# Patient Record
Sex: Female | Born: 1965
Health system: Southern US, Community
[De-identification: ages and names within clinical notes are randomized; demographics above are authoritative.]

## PROBLEM LIST (undated history)

## (undated) DIAGNOSIS — I422 Other hypertrophic cardiomyopathy: Secondary | ICD-10-CM

## (undated) DIAGNOSIS — F319 Bipolar disorder, unspecified: Secondary | ICD-10-CM

## (undated) DIAGNOSIS — F1011 Alcohol abuse, in remission: Secondary | ICD-10-CM

## (undated) DIAGNOSIS — F1911 Other psychoactive substance abuse, in remission: Secondary | ICD-10-CM

## (undated) DIAGNOSIS — N3010 Interstitial cystitis (chronic) without hematuria: Secondary | ICD-10-CM

## (undated) DIAGNOSIS — F909 Attention-deficit hyperactivity disorder, unspecified type: Secondary | ICD-10-CM

## (undated) HISTORY — PX: OTHER SURGICAL HISTORY: SHX169

## (undated) HISTORY — PX: APPENDECTOMY: SHX54

## (undated) HISTORY — DX: Other psychoactive substance abuse, in remission: F19.11

## (undated) HISTORY — DX: Other hypertrophic cardiomyopathy: I42.2

## (undated) HISTORY — DX: Alcohol abuse, in remission: F10.11

---
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
## 1991-03-22 HISTORY — PX: SALPINGECTOMY: SHX328

## 2000-10-02 ENCOUNTER — Emergency Department (HOSPITAL_COMMUNITY): Admission: EM | Admit: 2000-10-02 | Discharge: 2000-10-02 | Payer: Self-pay | Admitting: Emergency Medicine

## 2000-10-12 ENCOUNTER — Inpatient Hospital Stay (HOSPITAL_COMMUNITY): Admission: AD | Admit: 2000-10-12 | Discharge: 2000-10-12 | Payer: Self-pay | Admitting: Obstetrics & Gynecology

## 2000-10-12 ENCOUNTER — Encounter: Payer: Self-pay | Admitting: Obstetrics & Gynecology

## 2000-10-17 ENCOUNTER — Other Ambulatory Visit: Admission: RE | Admit: 2000-10-17 | Discharge: 2000-10-17 | Payer: Self-pay | Admitting: *Deleted

## 2000-11-26 ENCOUNTER — Emergency Department (HOSPITAL_COMMUNITY): Admission: EM | Admit: 2000-11-26 | Discharge: 2000-11-26 | Payer: Self-pay | Admitting: *Deleted

## 2001-03-16 ENCOUNTER — Inpatient Hospital Stay (HOSPITAL_COMMUNITY): Admission: AD | Admit: 2001-03-16 | Discharge: 2001-03-16 | Payer: Self-pay | Admitting: *Deleted

## 2001-03-20 ENCOUNTER — Inpatient Hospital Stay (HOSPITAL_COMMUNITY): Admission: AD | Admit: 2001-03-20 | Discharge: 2001-03-20 | Payer: Self-pay | Admitting: *Deleted

## 2001-03-21 ENCOUNTER — Inpatient Hospital Stay (HOSPITAL_COMMUNITY): Admission: AD | Admit: 2001-03-21 | Discharge: 2001-03-23 | Payer: Self-pay | Admitting: *Deleted

## 2001-10-26 ENCOUNTER — Other Ambulatory Visit: Admission: RE | Admit: 2001-10-26 | Discharge: 2001-10-26 | Payer: Self-pay | Admitting: *Deleted

## 2002-10-02 ENCOUNTER — Other Ambulatory Visit: Admission: RE | Admit: 2002-10-02 | Discharge: 2002-10-02 | Payer: Self-pay | Admitting: *Deleted

## 2004-11-11 ENCOUNTER — Ambulatory Visit (HOSPITAL_COMMUNITY): Admission: RE | Admit: 2004-11-11 | Discharge: 2004-11-11 | Payer: Self-pay | Admitting: Obstetrics

## 2005-10-11 ENCOUNTER — Ambulatory Visit: Payer: Self-pay | Admitting: Family Medicine

## 2005-10-11 ENCOUNTER — Ambulatory Visit: Payer: Self-pay | Admitting: *Deleted

## 2006-01-19 ENCOUNTER — Emergency Department (HOSPITAL_COMMUNITY): Admission: EM | Admit: 2006-01-19 | Discharge: 2006-01-19 | Payer: Self-pay | Admitting: Emergency Medicine

## 2008-03-19 ENCOUNTER — Ambulatory Visit (HOSPITAL_COMMUNITY): Admission: RE | Admit: 2008-03-19 | Discharge: 2008-03-19 | Payer: Self-pay | Admitting: Obstetrics

## 2008-05-22 ENCOUNTER — Ambulatory Visit (HOSPITAL_BASED_OUTPATIENT_CLINIC_OR_DEPARTMENT_OTHER): Admission: RE | Admit: 2008-05-22 | Discharge: 2008-05-22 | Payer: Self-pay | Admitting: Urology

## 2009-03-16 ENCOUNTER — Emergency Department (HOSPITAL_COMMUNITY): Admission: EM | Admit: 2009-03-16 | Discharge: 2009-03-17 | Payer: Self-pay | Admitting: Emergency Medicine

## 2010-04-15 ENCOUNTER — Ambulatory Visit (HOSPITAL_COMMUNITY)
Admission: RE | Admit: 2010-04-15 | Discharge: 2010-04-15 | Payer: Self-pay | Source: Home / Self Care | Attending: Obstetrics | Admitting: Obstetrics

## 2010-06-21 LAB — CBC
HCT: 44.3 % (ref 36.0–46.0)
Hemoglobin: 15.3 g/dL — ABNORMAL HIGH (ref 12.0–15.0)
MCHC: 34.6 g/dL (ref 30.0–36.0)
MCV: 91.5 fL (ref 78.0–100.0)
Platelets: 203 10*3/uL (ref 150–400)
RBC: 4.84 MIL/uL (ref 3.87–5.11)
RDW: 13 % (ref 11.5–15.5)
WBC: 18.3 10*3/uL — ABNORMAL HIGH (ref 4.0–10.5)

## 2010-06-21 LAB — DIFFERENTIAL
Basophils Absolute: 0 10*3/uL (ref 0.0–0.1)
Basophils Relative: 0 % (ref 0–1)
Eosinophils Absolute: 0.2 10*3/uL (ref 0.0–0.7)
Eosinophils Relative: 1 % (ref 0–5)
Lymphocytes Relative: 3 % — ABNORMAL LOW (ref 12–46)
Lymphs Abs: 0.5 10*3/uL — ABNORMAL LOW (ref 0.7–4.0)
Monocytes Absolute: 0.4 10*3/uL (ref 0.1–1.0)
Monocytes Relative: 2 % — ABNORMAL LOW (ref 3–12)
Neutro Abs: 17.2 10*3/uL — ABNORMAL HIGH (ref 1.7–7.7)
Neutrophils Relative %: 94 % — ABNORMAL HIGH (ref 43–77)

## 2010-06-21 LAB — COMPREHENSIVE METABOLIC PANEL
ALT: 37 U/L — ABNORMAL HIGH (ref 0–35)
AST: 30 U/L (ref 0–37)
Albumin: 4.3 g/dL (ref 3.5–5.2)
Alkaline Phosphatase: 99 U/L (ref 39–117)
BUN: 11 mg/dL (ref 6–23)
CO2: 26 mEq/L (ref 19–32)
Calcium: 8.9 mg/dL (ref 8.4–10.5)
Chloride: 101 mEq/L (ref 96–112)
Creatinine, Ser: 0.73 mg/dL (ref 0.4–1.2)
GFR calc non Af Amer: >60 mL/min (ref >60)
Glucose, Bld: 81 mg/dL (ref 70–99)
Potassium: 3.5 mEq/L (ref 3.5–5.1)
Sodium: 138 mEq/L (ref 135–145)
Total Bilirubin: 0.6 mg/dL (ref 0.3–1.2)
Total Protein: 7.6 g/dL (ref 6.0–8.3)

## 2010-06-21 LAB — POCT I-STAT, CHEM 8
BUN: 13 mg/dL (ref 6–23)
Calcium, Ion: 1.06 mmol/L — ABNORMAL LOW (ref 1.12–1.32)
Chloride: 105 mEq/L (ref 96–112)
Creatinine, Ser: 0.7 mg/dL (ref 0.4–1.2)
Glucose, Bld: 101 mg/dL — ABNORMAL HIGH (ref 70–99)
HCT: 47 % — ABNORMAL HIGH (ref 36.0–46.0)
Hemoglobin: 16 g/dL — ABNORMAL HIGH (ref 12.0–15.0)
Potassium: 3.7 mEq/L (ref 3.5–5.1)
Sodium: 138 mEq/L (ref 135–145)
TCO2: 27 mmol/L (ref 0–100)

## 2010-06-21 LAB — URINALYSIS, ROUTINE W REFLEX MICROSCOPIC
Glucose, UA: NEGATIVE mg/dL
Hgb urine dipstick: NEGATIVE
Ketones, ur: 15 mg/dL — AB
Nitrite: NEGATIVE
Protein, ur: NEGATIVE mg/dL
Specific Gravity, Urine: 1.027 (ref 1.005–1.030)
Urobilinogen, UA: 0.2 mg/dL (ref 0.0–1.0)
pH: 5 (ref 5.0–8.0)

## 2010-06-21 LAB — POCT PREGNANCY, URINE

## 2010-06-21 LAB — LIPASE, BLOOD: Lipase: 17 U/L (ref 11–59)

## 2010-07-01 LAB — POCT HEMOGLOBIN-HEMACUE: Hemoglobin: 16 g/dL — ABNORMAL HIGH (ref 12.0–15.0)

## 2010-07-01 LAB — POCT PREGNANCY, URINE

## 2010-08-03 NOTE — Op Note (Signed)
[name], MANNER                ACCOUNT NO.:  [id number]   MEDICAL RECORD NO.:  [id number]          PATIENT TYPE:  AMB   LOCATION:  [facility]                         FACILITY:  [facility]   PHYSICIAN:  Martina Sinner, MD DATE OF BIRTH:  1965/09/07   DATE OF PROCEDURE:  05/22/2008  DATE OF DISCHARGE:                               OPERATIVE REPORT   PREOPERATIVE DIAGNOSIS:  Pelvic pain.   POSTOPERATIVE DIAGNOSIS:  Interstitial cystitis.   SURGERY:  Cystoscopy, bladder hydrodistention, installation of bladder  rescue treatment.   INDICATION:  Kylie Wright has pelvic pain and frequency.  She had  negative cultures.   DESCRIPTION OF PROCEDURE:  The patient prepped and draped in usual  fashion.  A 21 scope was utilized.  Bladder mucosa and trigone were  normal.  There was no  foreign body or carcinoma.  She only could be  hydrodistended to 500 or 550 mL.  Bladder was emptied and she was  reexamined.  She had diffuse glomerulations in the bottom third of her  bladder around the trigone and at 5 and 7 o'clock.  In my opinion, she  had findings in keeping with interstitial cystitis.  Bladder was  emptied.   Marcaine 15 mL of 0.5% Marcaine with 400 mg of peridium was instilled.  The patient will be treated as having interstitial cystitis.           ______________________________  Martina Sinner, MD  Electronically Signed     SAM/MEDQ  D:  05/22/2008  T:  05/22/2008  Job:  [id number]

## 2010-08-06 NOTE — Discharge Summary (Signed)
[facility]  Patient:    Kylie Wright, Kylie Wright Visit Number: [id number] MRN: [id number]          Service Type: OBS Location: 910A 9105 01 Attending Physician:  Pleas Koch Dictated by:   Georgina Peer, M.D. Admit Date:  03/21/2001 Discharge Date: 03/23/2001                             Discharge Summary  ADMISSION DIAGNOSES:          Pregnancy at 39 5/7 weeks, mild preeclampsia, active labor.  DISCHARGE DIAGNOSES:          Pregnancy at 39 5/7 weeks, mild preeclampsia, active labor.  HISTORY:                      The patient is a 45 year old gravida 2, para 0-0-1-0, [facility] March 23, 2001 who presented at 33 5/7 weeks with regular uterine contractions, mild headache, and elevated blood pressure.  The patient also developed clonus.  The patients blood pressure had been slowly climbing over the last couple weeks.  The patient progressed in labor, was placed on magnesium sulfate, had a normal spontaneous vaginal delivery of a female infant named [name] who weighed 6 pounds 9 ounces.  Was born at 20 on March 23, 2002 with Apgars of 3 and 8.  The infant had meconium stained fluid which was treated with amnioinfusion, but went to the regular nursery.  The infant subsequently had tachypnea and was admitted for observation in the NICU on March 22, 2001.  Patient did well.  She continued on magnesium sulfate until the morning of the first day and then it was discontinued.  She had good output.  Blood pressures were ranging 130-160/74-100.  She had no blurred vision.  Her hyperreflexia resolved.  Postoperative hemoglobin was 12.5, platelets 189,000, magnesium 5.6.  PIH laboratories were normal.  She did well after the magnesium was discontinued.  Blood pressure was labile and seemed to coincide with emotional swings secondary to worrying because of the babys NICU status.  She had a normal bowel movement, was eating, ambulating, voiding without difficulty.   Her examination was normal with uterus involuting. Perineum was normal after a small right vaginal sulcus tear which was repaired.  She was discharged on the second day in good condition to follow up in the office in one week for blood pressure checks.  She was given discharge instructions.  She will take Tylenol or Advil for pain and will call for fever, chills, nausea, vomiting, urinary problems, breast-feeding problems, heavy bleeding, or perineal pain. Dictated by:   Georgina Peer, M.D. Attending Physician:  Pleas Koch DD:  03/23/01 TD:  03/24/01 Job: [id number]

## 2011-06-09 ENCOUNTER — Other Ambulatory Visit (HOSPITAL_COMMUNITY): Payer: Self-pay | Admitting: Internal Medicine

## 2011-06-09 DIAGNOSIS — Z1231 Encounter for screening mammogram for malignant neoplasm of breast: Secondary | ICD-10-CM

## 2011-07-06 ENCOUNTER — Ambulatory Visit (HOSPITAL_COMMUNITY)
Admission: RE | Admit: 2011-07-06 | Discharge: 2011-07-06 | Disposition: A | Discharge status: Home / Self Care | Payer: Medicaid Other | Source: Ambulatory Visit | Attending: Internal Medicine | Admitting: Internal Medicine

## 2011-07-06 DIAGNOSIS — Z1231 Encounter for screening mammogram for malignant neoplasm of breast: Secondary | ICD-10-CM | POA: Insufficient documentation

## 2012-04-06 ENCOUNTER — Encounter (HOSPITAL_COMMUNITY): Payer: Self-pay | Admitting: Emergency Medicine

## 2012-04-06 ENCOUNTER — Emergency Department (HOSPITAL_COMMUNITY)
Admission: EM | Admit: 2012-04-06 | Discharge: 2012-04-07 | Disposition: A | Discharge status: Home / Self Care | Payer: Medicaid Other | Attending: Emergency Medicine | Admitting: Emergency Medicine

## 2012-04-06 DIAGNOSIS — F319 Bipolar disorder, unspecified: Secondary | ICD-10-CM | POA: Insufficient documentation

## 2012-04-06 DIAGNOSIS — Z3202 Encounter for pregnancy test, result negative: Secondary | ICD-10-CM | POA: Insufficient documentation

## 2012-04-06 DIAGNOSIS — R109 Unspecified abdominal pain: Secondary | ICD-10-CM

## 2012-04-06 DIAGNOSIS — Z79899 Other long term (current) drug therapy: Secondary | ICD-10-CM | POA: Insufficient documentation

## 2012-04-06 DIAGNOSIS — N3010 Interstitial cystitis (chronic) without hematuria: Secondary | ICD-10-CM

## 2012-04-06 DIAGNOSIS — F17200 Nicotine dependence, unspecified, uncomplicated: Secondary | ICD-10-CM | POA: Insufficient documentation

## 2012-04-06 DIAGNOSIS — F909 Attention-deficit hyperactivity disorder, unspecified type: Secondary | ICD-10-CM | POA: Insufficient documentation

## 2012-04-06 HISTORY — DX: Attention-deficit hyperactivity disorder, unspecified type: F90.9

## 2012-04-06 HISTORY — DX: Interstitial cystitis (chronic) without hematuria: N30.10

## 2012-04-06 HISTORY — DX: Bipolar disorder, unspecified: F31.9

## 2012-04-06 LAB — URINALYSIS, ROUTINE W REFLEX MICROSCOPIC
Glucose, UA: NEGATIVE mg/dL
Ketones, ur: NEGATIVE mg/dL
Leukocytes, UA: NEGATIVE
Nitrite: NEGATIVE
Protein, ur: NEGATIVE mg/dL
Specific Gravity, Urine: 1.041 — ABNORMAL HIGH (ref 1.005–1.030)
Urobilinogen, UA: 1 mg/dL (ref 0.0–1.0)
pH: 5.5 (ref 5.0–8.0)

## 2012-04-06 LAB — URINE MICROSCOPIC-ADD ON

## 2012-04-06 LAB — POCT PREGNANCY, URINE: Preg Test, Ur: NEGATIVE

## 2012-04-06 NOTE — ED Notes (Addendum)
Pt states she has interstitial cystitis and has not had her period in 3 months.  C/o generalized abd pain. Denies nausea, vomiting, or urinary complaints.

## 2012-04-07 ENCOUNTER — Encounter (HOSPITAL_COMMUNITY): Payer: Self-pay | Admitting: Family

## 2012-04-07 ENCOUNTER — Inpatient Hospital Stay (EMERGENCY_DEPARTMENT_HOSPITAL)
Admission: AD | Admit: 2012-04-07 | Discharge: 2012-04-08 | Disposition: A | Discharge status: Home / Self Care | Payer: Medicaid Other | Source: Ambulatory Visit | Attending: Obstetrics & Gynecology | Admitting: Obstetrics & Gynecology

## 2012-04-07 DIAGNOSIS — D259 Leiomyoma of uterus, unspecified: Secondary | ICD-10-CM | POA: Insufficient documentation

## 2012-04-07 DIAGNOSIS — R109 Unspecified abdominal pain: Secondary | ICD-10-CM | POA: Insufficient documentation

## 2012-04-07 DIAGNOSIS — N3010 Interstitial cystitis (chronic) without hematuria: Secondary | ICD-10-CM | POA: Insufficient documentation

## 2012-04-07 DIAGNOSIS — R102 Pelvic and perineal pain: Secondary | ICD-10-CM

## 2012-04-07 MED ORDER — KETOROLAC TROMETHAMINE 10 MG PO TABS: 10.0000 mg | ORAL_TABLET | Freq: Four times a day (QID) | ORAL | Status: DC | PRN

## 2012-04-07 MED ORDER — KETOROLAC TROMETHAMINE 60 MG/2ML IM SOLN
60.0000 mg | Freq: Once | INTRAMUSCULAR | Status: AC
  Administered 2012-04-07: 60 mg via INTRAMUSCULAR
  Filled 2012-04-07: qty 2

## 2012-04-07 NOTE — MAU Note (Signed)
Patient reports middle abdominal cramping since 0830 today; she has interstitial cystitis and is not having a period for a few months. Was seen at [facility] ED yesterday because she thought pain was related to cystitis; came here today because she felt pain was related to "female pains".  Reports spotting since 2100 tonight.

## 2012-04-07 NOTE — ED Provider Notes (Signed)
History     CSN: [id number]  Arrival date & time 04/06/12  2200   First MD Initiated Contact with Patient 04/07/12 0004      Chief Complaint  Patient presents with  . Abdominal Pain    (Consider location/radiation/quality/duration/timing/severity/associated sxs/prior treatment) HPI Comments: 47 y/o female with hx of IC who presents with c/o abdominal pain.  Has similar sx int he past - the pain is lower abdominal, intermittent, aching and sharp in nature, nothing seems to make it better or worse and it is not associated with diarrhea, dysuria or nausea or vomiting. At this time she has no pain. Does have a history of substance abuse and does not use opiate medications.  Patient is a 47 y.o. female presenting with abdominal pain. The history is provided by the patient.  Abdominal Pain The primary symptoms of the illness include abdominal pain. The primary symptoms of the illness do not include nausea, vomiting or diarrhea.    Past Medical History  Diagnosis Date  . Interstitial cystitis   . ADHD (attention deficit hyperactivity disorder)   . Bipolar 1 disorder     Past Surgical History  Procedure Date  . Vaginal surgery     No family history on file.  History  Substance Use Topics  . Smoking status: Current Every Day Smoker  . Smokeless tobacco: Not on file  . Alcohol Use: No    OB History    Grav Para Term Preterm Abortions TAB SAB Ect Mult Living                  Review of Systems  Gastrointestinal: Positive for abdominal pain. Negative for nausea, vomiting and diarrhea.  All other systems reviewed and are negative.    Allergies  Review of patient's allergies indicates no known allergies.  Home Medications   Current Outpatient Rx  Name  Route  Sig  Dispense  Refill  . ASENAPINE MALEATE 5 MG SL SUBL   Sublingual   Place 5 mg under the tongue at bedtime.         Marland Kitchen MARCAINE IJ   Injection   Inject as directed 2 (two) times a week. On Mondays &  Fridays         . HYDROCHLOROTHIAZIDE 50 MG PO TABS   Oral   Take 50 mg by mouth every morning.         Marland Kitchen LISDEXAMFETAMINE DIMESYLATE 50 MG PO CAPS   Oral   Take 50 mg by mouth every morning.         Marland Kitchen MELOXICAM 7.5 MG PO TABS   Oral   Take 7.5 mg by mouth 2 (two) times daily as needed. For pain         . PENTOSAN POLYSULFATE SODIUM 100 MG PO CAPS   Oral   Take 200 mg by mouth 2 (two) times daily.         Marland Kitchen SOLIFENACIN SUCCINATE 5 MG PO TABS   Oral   Take 10 mg by mouth at bedtime.         Marland Kitchen KETOROLAC TROMETHAMINE 10 MG PO TABS   Oral   Take 1 tablet (10 mg total) by mouth every 6 (six) hours as needed for pain.   20 tablet   0     BP 136/83  Pulse 89  Temp 97.6 F (36.4 C) (Oral)  Resp 18  SpO2 100%  LMP 01/05/2012  Physical Exam  Nursing note and vitals reviewed. Constitutional: She  appears well-developed and well-nourished. No distress.  HENT:  Head: Normocephalic and atraumatic.  Mouth/Throat: Oropharynx is clear and moist. No oropharyngeal exudate.  Eyes: Conjunctivae normal and EOM are normal. Pupils are equal, round, and reactive to light. Right eye exhibits no discharge. Left eye exhibits no discharge. No scleral icterus.  Neck: Normal range of motion. Neck supple. No JVD present. No thyromegaly present.  Cardiovascular: Normal rate, regular rhythm, normal heart sounds and intact distal pulses.  Exam reveals no gallop and no friction rub.   No murmur heard. Pulmonary/Chest: Effort normal and breath sounds normal. No respiratory distress. She has no wheezes. She has no rales.  Abdominal: Soft. Bowel sounds are normal. She exhibits no distension and no mass. There is no tenderness.       No abdominal tenderness  Musculoskeletal: Normal range of motion. She exhibits no edema and no tenderness.  Lymphadenopathy:    She has no cervical adenopathy.  Neurological: She is alert. Coordination normal.  Skin: Skin is warm and dry. No rash noted. No  erythema.  Psychiatric: She has a normal mood and affect. Her behavior is normal.    ED Course  Procedures (including critical care time)  Labs Reviewed  URINALYSIS, ROUTINE W REFLEX MICROSCOPIC - Abnormal; Notable for the following:    APPearance CLOUDY (*)     Specific Gravity, Urine 1.041 (*)     Hgb urine dipstick MODERATE (*)     Bilirubin Urine SMALL (*)     All other components within normal limits  URINE MICROSCOPIC-ADD ON - Abnormal; Notable for the following:    Squamous Epithelial / LPF MANY (*)     Bacteria, UA MANY (*)     All other components within normal limits  POCT PREGNANCY, URINE  URINE CULTURE   No results found.   1. Abdominal pain   2. Interstitial cystitis       MDM  The patient is well, she has normal vital signs and a nontender abdomen at this time. Due to her fluctuating colicky type pain I suspect that she has pain from her interstitial cystitis, she also agrees that this is similar to her pain in the past. We'll give her a stronger anti-inflammatory for home, she is already following up with urology.  Toradol        Vida Roller, MD 04/07/12 [phone]

## 2012-04-07 NOTE — MAU Note (Signed)
Started hurting Thurs night. I have interstial cystitis. Seen at [facility] ED last night and given shot for pain and pain pills. I'm in recovery and did not want narcotics. Having cramping today which haven't stopped and having small amt vag bleeding. Toradol pills not helping.

## 2012-04-07 NOTE — MAU Provider Note (Signed)
History     CSN: [id number]  Arrival date and time: 04/07/12 2212   First Provider Initiated Contact with Patient 04/07/12 2317      Chief Complaint  Patient presents with  . Abdominal Cramping   HPI  Patient reports middle abdominal cramping since 0830 today; she has interstitial cystitis and is not having a period for a few months. Seen at [facility] ED yesterday because she thought pain was related to cystitis; came here today because she felt pain was related to "female pains". Reports spotting since 2100 tonight.     Past Medical History  Diagnosis Date  . Interstitial cystitis   . ADHD (attention deficit hyperactivity disorder)   . Bipolar 1 disorder     Past Surgical History  Procedure Date  . Vaginal surgery     No family history on file.  History  Substance Use Topics  . Smoking status: Current Every Day Smoker  . Smokeless tobacco: Not on file  . Alcohol Use: No    Allergies: No Known Allergies  Prescriptions prior to admission  Medication Sig Dispense Refill  . asenapine (SAPHRIS) 5 MG SUBL Place 5 mg under the tongue at bedtime.      . Bupivacaine HCl (MARCAINE IJ) Inject as directed 2 (two) times a week. On Mondays & Fridays      . hydrochlorothiazide (HYDRODIURIL) 50 MG tablet Take 50 mg by mouth every morning.      Marland Kitchen ketorolac (TORADOL) 10 MG tablet Take 1 tablet (10 mg total) by mouth every 6 (six) hours as needed for pain.  20 tablet  0  . lisdexamfetamine (VYVANSE) 50 MG capsule Take 50 mg by mouth every morning.      . meloxicam (MOBIC) 7.5 MG tablet Take 7.5 mg by mouth 2 (two) times daily as needed. For pain      . pentosan polysulfate (ELMIRON) 100 MG capsule Take 200 mg by mouth 2 (two) times daily.      . solifenacin (VESICARE) 5 MG tablet Take 10 mg by mouth at bedtime.        Review of Systems  Gastrointestinal: Positive for abdominal pain (lower).  Genitourinary:       Spotting of vaginal blood  All other systems reviewed and are  negative.   Physical Exam   Blood pressure 142/83, pulse 83, temperature 97.9 F (36.6 C), temperature source Oral, resp. rate 20, height 5\' 2"  (1.575 m), weight 97.796 kg (215 lb 9.6 oz), last menstrual period 01/05/2012.  Physical Exam  Constitutional: She is oriented to person, place, and time. She appears well-developed and well-nourished. No distress.  HENT:  Head: Normocephalic.  Neck: Normal range of motion. Neck supple.  Cardiovascular: Normal rate, regular rhythm and normal heart sounds.   Respiratory: Effort normal and breath sounds normal. No respiratory distress.  GI: Soft. She exhibits no mass. There is tenderness (midpelvic). There is no rebound and no guarding.  Genitourinary: Uterus is not tender. Cervix exhibits no motion tenderness. Right adnexum displays no mass and no tenderness. Left adnexum displays no mass and no tenderness. There is bleeding (negative clots) around the vagina.  Neurological: She is alert and oriented to person, place, and time. She has normal reflexes.  Skin: Skin is warm and dry.    MAU Course  Procedures Results for orders placed during the hospital encounter of 04/07/12 (from the past 24 hour(s))  WET PREP, GENITAL     Status: Normal   Collection Time   04/07/12 11:40  PM      Component Value Range   Yeast Wet Prep HPF POC NONE SEEN  NONE SEEN   Trich, Wet Prep NONE SEEN  NONE SEEN   Clue Cells Wet Prep HPF POC NONE SEEN  NONE SEEN   WBC, Wet Prep HPF POC NONE SEEN  NONE SEEN   Ultrasound: Small uterine fibroid. Endometrium is normal. Right ovary is  normal with suggestion of hydrosalpinx. Left ovary is not  visualized.  Toradol 60 mg IM > pain improved   Assessment and Plan  Pelvic Cramping   Plan: Dc to home Schedule appt with Dr. Clearance Coots for follow-up Continue Toradol PO  [facility] 04/07/2012, 11:21 PM

## 2012-04-08 ENCOUNTER — Inpatient Hospital Stay (HOSPITAL_COMMUNITY): Payer: Medicaid Other

## 2012-04-08 DIAGNOSIS — D259 Leiomyoma of uterus, unspecified: Secondary | ICD-10-CM

## 2012-04-08 DIAGNOSIS — N3010 Interstitial cystitis (chronic) without hematuria: Secondary | ICD-10-CM

## 2012-04-08 DIAGNOSIS — R109 Unspecified abdominal pain: Secondary | ICD-10-CM

## 2012-04-08 LAB — WET PREP, GENITAL
Clue Cells Wet Prep HPF POC: NONE SEEN
Trich, Wet Prep: NONE SEEN
WBC, Wet Prep HPF POC: NONE SEEN
Yeast Wet Prep HPF POC: NONE SEEN

## 2012-04-08 LAB — URINE CULTURE: Colony Count: >=100000

## 2012-04-08 MED ORDER — KETOROLAC TROMETHAMINE 60 MG/2ML IM SOLN
60.0000 mg | Freq: Once | INTRAMUSCULAR | Status: AC
  Administered 2012-04-08: 60 mg via INTRAMUSCULAR
  Filled 2012-04-08: qty 2

## 2012-04-09 LAB — GC/CHLAMYDIA PROBE AMP
CT Probe RNA: NEGATIVE
GC Probe RNA: NEGATIVE

## 2012-04-09 NOTE — ED Notes (Signed)
+   Urine Chart sent to EDP office for review. 

## 2012-04-13 NOTE — ED Notes (Signed)
Chart returned from EDP office. Recommend Cipro 500 mg po BID x 7 days for UTI written by Fayrene Helper needs to be called to pharmacy.

## 2012-07-11 ENCOUNTER — Other Ambulatory Visit (HOSPITAL_COMMUNITY): Payer: Self-pay | Admitting: Physical Therapy

## 2012-07-11 DIAGNOSIS — Z1231 Encounter for screening mammogram for malignant neoplasm of breast: Secondary | ICD-10-CM

## 2012-07-13 ENCOUNTER — Ambulatory Visit (HOSPITAL_COMMUNITY)
Admission: RE | Admit: 2012-07-13 | Discharge: 2012-07-13 | Disposition: A | Discharge status: Home / Self Care | Payer: Medicaid Other | Source: Ambulatory Visit | Attending: Physical Therapy | Admitting: Physical Therapy

## 2012-07-13 DIAGNOSIS — Z1231 Encounter for screening mammogram for malignant neoplasm of breast: Secondary | ICD-10-CM

## 2012-07-25 ENCOUNTER — Ambulatory Visit (INDEPENDENT_AMBULATORY_CARE_PROVIDER_SITE_OTHER): Payer: Medicaid Other | Admitting: Obstetrics

## 2012-07-25 ENCOUNTER — Encounter: Payer: Self-pay | Admitting: Obstetrics

## 2012-07-25 VITALS — BP 126/91 | HR 101 | Temp 98.6°F | Ht 63.0 in | Wt 211.0 lb

## 2012-07-25 DIAGNOSIS — Z01419 Encounter for gynecological examination (general) (routine) without abnormal findings: Secondary | ICD-10-CM

## 2012-07-25 DIAGNOSIS — Z113 Encounter for screening for infections with a predominantly sexual mode of transmission: Secondary | ICD-10-CM

## 2012-07-25 DIAGNOSIS — Z0000 Encounter for general adult medical examination without abnormal findings: Secondary | ICD-10-CM

## 2012-07-25 DIAGNOSIS — N3010 Interstitial cystitis (chronic) without hematuria: Secondary | ICD-10-CM

## 2012-07-25 LAB — POCT URINALYSIS DIPSTICK
Bilirubin, UA: NEGATIVE
Blood, UA: NEGATIVE
Glucose, UA: NEGATIVE
Ketones, UA: NEGATIVE
Leukocytes, UA: NEGATIVE
Nitrite, UA: NEGATIVE
Protein, UA: NEGATIVE
Spec Grav, UA: <=1.005
Urobilinogen, UA: NEGATIVE
pH, UA: 7

## 2012-07-25 NOTE — Progress Notes (Signed)
Subjective:     Kylie Wright is a 47 y.o. female here for a routine annual breast and PAP exam.  Current complaints: none.     Gynecologic History Last menstrual period: 07/20/2012 Contraception: none Last Pap: 06/27/2011. Results were: normal Last mammogram: 07/13/2012. Results were: normal  Obstetric History OB History   Grav Para Term Preterm Abortions TAB SAB Ect Mult Living   2 1 1  1   1  1      # Outc Date GA Lbr Len/2nd Wgt Sex Del Anes PTL Lv   1 ECT 1993           2 TRM 2003 [redacted]w[redacted]d   M SVD EPI No Yes       The following portions of the patient's history were reviewed and updated as appropriate: allergies, current medications, past family history, past medical history, past social history, past surgical history and problem list.  Review of Systems Pertinent items are noted in HPI.    Objective:    General appearance: alert and no distress Breasts: normal appearance, no masses or tenderness Abdomen: normal findings: soft, non-tender Pelvic: cervix normal in appearance, external genitalia normal, no adnexal masses or tenderness, no cervical motion tenderness, uterus normal size, shape, and consistency and vagina normal without discharge    Assessment:    Healthy female exam.    Plan:    Education reviewed: calcium supplements, low fat, low cholesterol diet, self breast exams and healthy living. Follow up in: 1 year.

## 2012-07-26 LAB — PAP IG W/ RFLX HPV ASCU

## 2012-07-26 LAB — GC/CHLAMYDIA PROBE AMP
CT Probe RNA: NEGATIVE
GC Probe RNA: NEGATIVE

## 2012-07-26 LAB — HIV ANTIBODY (ROUTINE TESTING W REFLEX): HIV: NONREACTIVE

## 2012-07-26 LAB — RPR

## 2012-07-26 LAB — HEPATITIS B SURFACE ANTIGEN: Hepatitis B Surface Ag: NEGATIVE

## 2013-07-26 ENCOUNTER — Other Ambulatory Visit: Payer: Self-pay | Admitting: Obstetrics

## 2013-07-26 DIAGNOSIS — Z1231 Encounter for screening mammogram for malignant neoplasm of breast: Secondary | ICD-10-CM

## 2013-08-02 ENCOUNTER — Ambulatory Visit (HOSPITAL_COMMUNITY)
Admission: RE | Admit: 2013-08-02 | Discharge: 2013-08-02 | Disposition: A | Discharge status: Home / Self Care | Payer: Medicaid Other | Source: Ambulatory Visit | Attending: Obstetrics | Admitting: Obstetrics

## 2013-08-02 DIAGNOSIS — Z1231 Encounter for screening mammogram for malignant neoplasm of breast: Secondary | ICD-10-CM

## 2013-08-13 ENCOUNTER — Ambulatory Visit: Payer: Medicaid Other | Admitting: Obstetrics

## 2013-08-20 ENCOUNTER — Ambulatory Visit: Payer: Medicaid Other | Admitting: Obstetrics

## 2014-01-20 ENCOUNTER — Encounter: Payer: Self-pay | Admitting: Obstetrics

## 2014-06-07 ENCOUNTER — Emergency Department (HOSPITAL_COMMUNITY)
Admission: EM | Admit: 2014-06-07 | Discharge: 2014-06-07 | Disposition: A | Discharge status: Home / Self Care | Payer: BLUE CROSS/BLUE SHIELD | Attending: Emergency Medicine | Admitting: Emergency Medicine

## 2014-06-07 ENCOUNTER — Encounter (HOSPITAL_COMMUNITY): Payer: Self-pay | Admitting: Emergency Medicine

## 2014-06-07 ENCOUNTER — Emergency Department (HOSPITAL_COMMUNITY): Payer: BLUE CROSS/BLUE SHIELD

## 2014-06-07 ENCOUNTER — Other Ambulatory Visit: Payer: Self-pay

## 2014-06-07 DIAGNOSIS — R55 Syncope and collapse: Secondary | ICD-10-CM | POA: Diagnosis not present

## 2014-06-07 DIAGNOSIS — Z3202 Encounter for pregnancy test, result negative: Secondary | ICD-10-CM | POA: Insufficient documentation

## 2014-06-07 DIAGNOSIS — Z720 Tobacco use: Secondary | ICD-10-CM | POA: Diagnosis not present

## 2014-06-07 DIAGNOSIS — Z87448 Personal history of other diseases of urinary system: Secondary | ICD-10-CM | POA: Insufficient documentation

## 2014-06-07 DIAGNOSIS — F319 Bipolar disorder, unspecified: Secondary | ICD-10-CM | POA: Insufficient documentation

## 2014-06-07 DIAGNOSIS — Z79899 Other long term (current) drug therapy: Secondary | ICD-10-CM | POA: Diagnosis not present

## 2014-06-07 DIAGNOSIS — F909 Attention-deficit hyperactivity disorder, unspecified type: Secondary | ICD-10-CM | POA: Insufficient documentation

## 2014-06-07 LAB — URINALYSIS, ROUTINE W REFLEX MICROSCOPIC
Bilirubin Urine: NEGATIVE
Glucose, UA: NEGATIVE mg/dL
Hgb urine dipstick: NEGATIVE
Ketones, ur: NEGATIVE mg/dL
Nitrite: NEGATIVE
Protein, ur: NEGATIVE mg/dL
Specific Gravity, Urine: 1.012 (ref 1.005–1.030)
Urobilinogen, UA: 0.2 mg/dL (ref 0.0–1.0)
pH: 6 (ref 5.0–8.0)

## 2014-06-07 LAB — BASIC METABOLIC PANEL
Anion gap: 9 (ref 5–15)
BUN: 11 mg/dL (ref 6–23)
CO2: 24 mmol/L (ref 19–32)
Calcium: 9.3 mg/dL (ref 8.4–10.5)
Chloride: 103 mmol/L (ref 96–112)
Creatinine, Ser: 0.84 mg/dL (ref 0.50–1.10)
GFR calc Af Amer: >90 mL/min (ref >90)
GFR calc non Af Amer: 80 mL/min — ABNORMAL LOW (ref >90)
Glucose, Bld: 98 mg/dL (ref 70–99)
Potassium: 3.6 mmol/L (ref 3.5–5.1)
Sodium: 136 mmol/L (ref 135–145)

## 2014-06-07 LAB — CBC WITH DIFFERENTIAL/PLATELET
Basophils Absolute: 0 10*3/uL (ref 0.0–0.1)
Basophils Relative: 0 % (ref 0–1)
Eosinophils Absolute: 0.2 10*3/uL (ref 0.0–0.7)
Eosinophils Relative: 2 % (ref 0–5)
HCT: 42.3 % (ref 36.0–46.0)
Hemoglobin: 14.3 g/dL (ref 12.0–15.0)
Lymphocytes Relative: 31 % (ref 12–46)
Lymphs Abs: 3.7 10*3/uL (ref 0.7–4.0)
MCH: 30.2 pg (ref 26.0–34.0)
MCHC: 33.8 g/dL (ref 30.0–36.0)
MCV: 89.2 fL (ref 78.0–100.0)
Monocytes Absolute: 0.8 10*3/uL (ref 0.1–1.0)
Monocytes Relative: 7 % (ref 3–12)
Neutro Abs: 7.2 10*3/uL (ref 1.7–7.7)
Neutrophils Relative %: 60 % (ref 43–77)
Platelets: 218 10*3/uL (ref 150–400)
RBC: 4.74 MIL/uL (ref 3.87–5.11)
RDW: 14 % (ref 11.5–15.5)
WBC: 11.9 10*3/uL — ABNORMAL HIGH (ref 4.0–10.5)

## 2014-06-07 LAB — URINE MICROSCOPIC-ADD ON

## 2014-06-07 LAB — CBG MONITORING, ED: Glucose-Capillary: 119 mg/dL — ABNORMAL HIGH (ref 70–99)

## 2014-06-07 LAB — POC URINE PREG, ED: Preg Test, Ur: NEGATIVE

## 2014-06-07 MED ORDER — LORAZEPAM 1 MG PO TABS
1.0000 mg | ORAL_TABLET | Freq: Once | ORAL | Status: AC
  Administered 2014-06-07: 1 mg via ORAL
  Filled 2014-06-07: qty 1

## 2014-06-07 NOTE — ED Notes (Signed)
Patient transported to X-ray 

## 2014-06-07 NOTE — ED Provider Notes (Signed)
CSN: [id number]     Arrival date & time 06/07/14  6433 History   First MD Initiated Contact with Patient 06/07/14 [phone]     Chief Complaint  Patient presents with  . Loss of Consciousness     (Consider location/radiation/quality/duration/timing/severity/associated sxs/prior Treatment) HPI Kylie Wright is a 49 y.o. female with history of drug and alcohol abuse, bipolar 1 disorder comes in for evaluation of dizziness and syncope. Patient states she was at an [place] meeting this morning at approximately 9:00 when she was walking up to a friend, talking to her and then said "I feel like I'm going to pass out". At which point patient states she was "knocked out cold". She does not know how long she was out for. She denies any chest pain, shortness of breath, headache, nausea or vomiting, diaphoresis. She does report feeling "tingly all over my body". She reports taking Vyvanse in the mornings, but did not take it this morning. She also reports not eating any breakfast, only coffee. No other aggravating or modifying factors. Denies dizziness now.  Past Medical History  Diagnosis Date  . Interstitial cystitis   . ADHD (attention deficit hyperactivity disorder)   . Bipolar 1 disorder   . History of drug abuse   . History of alcohol abuse    Past Surgical History  Procedure Laterality Date  . Vaginal surgery    . Salpingectomy  1993   Family History  Problem Relation Age of Onset  . Hypertension    . Diabetes    . Mental illness    . Cancer    . Cancer Sister 24    small intestine   . Alcoholism     History  Substance Use Topics  . Smoking status: Current Every Day Smoker -- 0.50 packs/day    Types: Cigarettes  . Smokeless tobacco: Never Used  . Alcohol Use: No   OB History    Gravida Para Term Preterm AB TAB SAB Ectopic Multiple Living   2 1 1  1   1  1      Review of Systems A 10 point review of systems was completed and was negative except for pertinent positives and negatives as  mentioned in the history of present illness     Allergies  Review of patient's allergies indicates no known allergies.  Home Medications   Prior to Admission medications   Medication Sig Start Date End Date Taking? Authorizing Provider  asenapine (SAPHRIS) 5 MG SUBL Place 5 mg under the tongue at bedtime.    Historical Provider, MD  atorvastatin (LIPITOR) 20 MG tablet Take 20 mg by mouth daily.    Historical Provider, MD  Bupivacaine HCl (MARCAINE IJ) Inject as directed 2 (two) times a week. On Mondays & Fridays    Historical Provider, MD  hydrochlorothiazide (HYDRODIURIL) 50 MG tablet Take 50 mg by mouth every morning.    Historical Provider, MD  ketorolac (TORADOL) 10 MG tablet Take 1 tablet (10 mg total) by mouth every 6 (six) hours as needed for pain. 04/07/12   Noemi Chapel, MD  lisdexamfetamine (VYVANSE) 50 MG capsule Take 50 mg by mouth every morning.    Historical Provider, MD  meloxicam (MOBIC) 7.5 MG tablet Take 7.5 mg by mouth 2 (two) times daily as needed. For pain    Historical Provider, MD  pentosan polysulfate (ELMIRON) 100 MG capsule Take 200 mg by mouth 2 (two) times daily.    Historical Provider, MD  solifenacin (VESICARE) 5 MG tablet Take  10 mg by mouth at bedtime.    Historical Provider, MD   BP 141/104 mmHg  Pulse 82  Temp(Src) 98 F (36.7 C) (Oral)  Resp 23  Ht 5' 1.5" (1.562 m)  Wt 203 lb (92.08 kg)  BMI 37.74 kg/m2  SpO2 96% Physical Exam  Constitutional: She is oriented to person, place, and time. She appears well-developed and well-nourished.  Patient is very anxious, crying  HENT:  Head: Normocephalic and atraumatic.  Mouth/Throat: Oropharynx is clear and moist.  Eyes: Conjunctivae are normal. Pupils are equal, round, and reactive to light. Right eye exhibits no discharge. Left eye exhibits no discharge. No scleral icterus.  Neck: Neck supple.  Cardiovascular: Normal rate, regular rhythm and normal heart sounds.   Pulmonary/Chest: Effort normal and  breath sounds normal. No respiratory distress. She has no wheezes. She has no rales.  Abdominal: Soft. There is no tenderness.  Musculoskeletal: She exhibits no tenderness.  Neurological: She is alert and oriented to person, place, and time.  Cranial Nerves II-XII grossly intact. Moves all 4 extremities without ataxia. Gait baseline.  Skin: Skin is warm and dry. No rash noted.  Psychiatric: She has a normal mood and affect.  Nursing note and vitals reviewed.   ED Course  Procedures (including critical care time) Labs Review Labs Reviewed  CBC WITH DIFFERENTIAL/PLATELET - Abnormal; Notable for the following:    WBC 11.9 (*)    All other components within normal limits  URINALYSIS, ROUTINE W REFLEX MICROSCOPIC - Abnormal; Notable for the following:    APPearance CLOUDY (*)    Leukocytes, UA SMALL (*)    All other components within normal limits  BASIC METABOLIC PANEL - Abnormal; Notable for the following:    GFR calc non Af Amer 80 (*)    All other components within normal limits  URINE MICROSCOPIC-ADD ON - Abnormal; Notable for the following:    Squamous Epithelial / LPF MANY (*)    Bacteria, UA FEW (*)    All other components within normal limits  CBG MONITORING, ED - Abnormal; Notable for the following:    Glucose-Capillary 119 (*)    All other components within normal limits  POC URINE PREG, ED    Imaging Review Dg Chest 2 View  06/07/2014   CLINICAL DATA:  Syncopal episode  EXAM: CHEST  2 VIEW  COMPARISON:  None.  FINDINGS: The heart size and mediastinal contours are within normal limits. Lungs remain clear except for minor basilar atelectasis. The visualized skeletal structures are unremarkable. Artifact overlies the upper lobes bilaterally. Thoracic and upper lumbar degenerative changes noted with a mild scoliosis.  IMPRESSION: No active cardiopulmonary disease.   Electronically Signed   By: Jerilynn Mages.  Shick M.D.   On: 06/07/2014 10:38     EKG Interpretation   Date/Time:   Saturday June 07 2014 10:59:23 EDT Ventricular Rate:  66 PR Interval:  146 QRS Duration: 82 QT Interval:  423 QTC Calculation: 443 R Axis:   -29 Text Interpretation:  Sinus rhythm Probable left atrial enlargement  Borderline left axis deviation Abnormal R-wave progression, late  transition Sinus rhythm Left axis deviation Abnormal ekg Confirmed by  Carmin Muskrat  MD [phone]) on 06/07/2014 11:06:07 AM     Meds given in ED:  Medications  LORazepam (ATIVAN) tablet 1 mg (1 mg Oral Given 06/07/14 1023)    Discharge Medication List as of 06/07/2014 11:50 AM     Filed Vitals:   06/07/14 0954 06/07/14 1024 06/07/14 1100 06/07/14 1130  BP: 158/101 138/82 134/105 141/104  Pulse: 80 75 73 82  Temp:      TempSrc:      Resp:  18 9 23   Height:      Weight:      SpO2: 99%  97% 96%    MDM  Vitals stable - WNL -afebrile Pt resting comfortably in ED. PE--normal neuro exam. Otherwise benign physical exam. Labwork noncontributory. EKG is reassuring. Nonspecific leukocytosis. Doubt UTI, suspect dirty catch. Imaging chest x-ray shows no acute cardio pulmonary pathology.  DDX--patient with syncopal episode without chest pain, shortness of breath, headache. Patient is negative per [facility] syncope rules. Feels well now ED. Symptoms resolved with 1 mg oral Ativan. Discussed with patient the necessity of staying properly hydrated, eating breakfast with medications. Will provide cardiology referral for outpatient cardiac monitoring.  I discussed all relevant lab findings and imaging results with pt and they verbalized understanding. Discussed f/u with PCP within 48 hrs and return precautions, pt very amenable to plan. Patient stable, in good condition and is appropriate for discharge. Prior to patient discharge, I discussed and reviewed this case with Dr. Vanita Panda   Final diagnoses:  Syncope, unspecified syncope type        Comer Locket, PA-C 06/08/14 [id number]  Carmin Muskrat,  MD 06/08/14 1520

## 2014-06-07 NOTE — ED Notes (Signed)
pts contact is Legrand Como and phone # is [phone]

## 2014-06-07 NOTE — ED Notes (Signed)
Pt. Stated, I had a dizziness were I was out cold , and I've never experienced anything like that before.  Im so dizzy.

## 2014-06-07 NOTE — ED Notes (Signed)
Patient returned from X-ray 

## 2014-06-07 NOTE — Discharge Instructions (Signed)
Holter Monitoring A Holter monitor is a small device with electrodes (small sticky patches) that attach to your chest. It records the electrical activity of your heart and is worn continuously for 24-48 hours.  A HOLTER MONITOR IS USED TO  Detect heart problems such as:  Heart arrhythmia. Is an abnormal or irregular heartbeat. With some heart arrhythmias, you may not feel or know that you have an irregular heart rhythm.  Palpitations, such as feeling your heart racing or fluttering. It is possible to have heart palpitations and not have a heart arrhythmia.  A heart rhythm that is too slow or too fast.  If you have problems fainting, near fainting or feeling light-headed, a Holter monitor may be worn to see if your heart is the cause. HOLTER MONITOR PREPARATION   Electrodes will be attached to the skin on your chest.  If you have hair on your chest, small areas may have to be shaved. This is done to help the patches stick better and make the recording more accurate.  The electrodes are attached by wires to the Holter monitor. The Holter monitor clips to your clothing. You will wear the monitor at all times, even while exercising and sleeping. HOME CARE INSTRUCTIONS   Wear your monitor at all times.  The wires and the monitor must stay dry. Do not get the monitor wet.  Do not bathe, swim or use a hot tub with it on.  You may do a "sponge" bath while you have the monitor on.  Keep your skin clean, do not put body lotion or moisturizer on your chest.  It's possible that your skin under the electrodes could become irritated. To keep this from happening, you may put the electrodes in slightly different places on your chest.  Your caregiver will also ask you to keep a diary of your activities, such as walking or doing chores. Be sure to note what you are doing if you experience heart symptoms such as palpitations. This will help your caregiver determine what might be contributing to your  symptoms. The information stored in your monitor will be reviewed by your caregiver alongside your diary entries.  Make sure the monitor is safely clipped to your clothing or in a location close to your body that your caregiver recommends.  The monitor and electrodes are removed when the test is over. Return the monitor as directed.  Be sure to follow up with your caregiver and discuss your Holter monitor results. SEEK IMMEDIATE MEDICAL CARE IF:  You faint or feel lightheaded.  You have trouble breathing.  You get pain in your chest, upper arm or jaw.  You feel sick to your stomach and your skin is pale, cool, or damp.  You think something is wrong with the way your heart is beating. MAKE SURE YOU:   Understand these instructions.  Will watch your condition.  Will get help right away if you are not doing well or get worse. Document Released: 12/04/2003 Document Revised: 05/30/2011 Document Reviewed: 04/17/2008 [facility] Patient Information 2015 [place], [place]. This information is not intended to replace advice given to you by your health care provider. Make sure you discuss any questions you have with your health care provider.  Syncope Syncope means a person passes out (faints). The person usually wakes up in less than 5 minutes. It is important to seek medical care for syncope. HOME CARE  Have someone stay with you until you feel normal.  Do not drive, use machines, or play  sports until your doctor says it is okay.  Keep all doctor visits as told.  Lie down when you feel like you might pass out. Take deep breaths. Wait until you feel normal before standing up.  Drink enough fluids to keep your pee (urine) clear or pale yellow.  If you take blood pressure or heart medicine, get up slowly. Take several minutes to sit and then stand. GET HELP RIGHT AWAY IF:   You have a severe headache.  You have pain in the chest, belly (abdomen), or back.  You are bleeding from the  mouth or butt (rectum).  You have black or tarry poop (stool).  You have an irregular or very fast heartbeat.  You have pain with breathing.  You keep passing out, or you have shaking (seizures) when you pass out.  You pass out when sitting or lying down.  You feel confused.  You have trouble walking.  You have severe weakness.  You have vision problems. If you fainted, call your local emergency services (911 in [place] Do not drive yourself to the hospital. MAKE SURE YOU:   Understand these instructions.  Will watch your condition.  Will get help right away if you are not doing well or get worse. Document Released: 08/24/2007 Document Revised: 09/06/2011 Document Reviewed: 05/06/2011 [facility] Patient Information 2015 [place], [place]. This information is not intended to replace advice given to you by your health care provider. Make sure you discuss any questions you have with your health care provider.

## 2014-07-10 ENCOUNTER — Encounter: Payer: Self-pay | Admitting: Obstetrics

## 2014-07-10 ENCOUNTER — Ambulatory Visit (INDEPENDENT_AMBULATORY_CARE_PROVIDER_SITE_OTHER): Payer: BLUE CROSS/BLUE SHIELD | Admitting: Obstetrics

## 2014-07-10 VITALS — BP 138/84 | HR 89 | Temp 98.0°F | Ht 62.5 in | Wt 193.0 lb

## 2014-07-10 DIAGNOSIS — Z01419 Encounter for gynecological examination (general) (routine) without abnormal findings: Secondary | ICD-10-CM

## 2014-07-10 DIAGNOSIS — N951 Menopausal and female climacteric states: Secondary | ICD-10-CM

## 2014-07-10 DIAGNOSIS — T148XXA Other injury of unspecified body region, initial encounter: Secondary | ICD-10-CM

## 2014-07-10 MED ORDER — CLINDAMYCIN HCL 300 MG PO CAPS: 300.0000 mg | ORAL_CAPSULE | Freq: Three times a day (TID) | ORAL | Status: DC

## 2014-07-10 NOTE — Patient Instructions (Signed)
Menopause Menopause is the normal time of life when menstrual periods stop completely. Menopause is complete when you have missed 12 consecutive menstrual periods. It usually occurs between the ages of 48 years and 55 years. Very rarely does a woman develop menopause before the age of 40 years. At menopause, your ovaries stop producing the female hormones estrogen and progesterone. This can cause undesirable symptoms and also affect your health. Sometimes the symptoms may occur 4-5 years before the menopause begins. There is no relationship between menopause and:  Oral contraceptives.  Number of children you had.  Race.  The age your menstrual periods started (menarche). Heavy smokers and very thin women may develop menopause earlier in life. CAUSES  The ovaries stop producing the female hormones estrogen and progesterone.  Other causes include:  Surgery to remove both ovaries.  The ovaries stop functioning for no known reason.  Tumors of the pituitary gland in the brain.  Medical disease that affects the ovaries and hormone production.  Radiation treatment to the abdomen or pelvis.  Chemotherapy that affects the ovaries. SYMPTOMS   Hot flashes.  Night sweats.  Decrease in sex drive.  Vaginal dryness and thinning of the vagina causing painful intercourse.  Dryness of the skin and developing wrinkles.  Headaches.  Tiredness.  Irritability.  Memory problems.  Weight gain.  Bladder infections.  Hair growth of the face and chest.  Infertility. More serious symptoms include:  Loss of bone (osteoporosis) causing breaks (fractures).  Depression.  Hardening and narrowing of the arteries (atherosclerosis) causing heart attacks and strokes. DIAGNOSIS   When the menstrual periods have stopped for 12 straight months.  Physical exam.  Hormone studies of the blood. TREATMENT  There are many treatment choices and nearly as many questions about them. The  decisions to treat or not to treat menopausal changes is an individual choice made with your health care provider. Your health care provider can discuss the treatments with you. Together, you can decide which treatment will work best for you. Your treatment choices may include:   Hormone therapy (estrogen and progesterone).  Non-hormonal medicines.  Treating the individual symptoms with medicine (for example antidepressants for depression).  Herbal medicines that may help specific symptoms.  Counseling by a psychiatrist or psychologist.  Group therapy.  Lifestyle changes including:  Eating healthy.  Regular exercise.  Limiting caffeine and alcohol.  Stress management and meditation.  No treatment. HOME CARE INSTRUCTIONS   Take the medicine your health care provider gives you as directed.  Get plenty of sleep and rest.  Exercise regularly.  Eat a diet that contains calcium (good for the bones) and soy products (acts like estrogen hormone).  Avoid alcoholic beverages.  Do not smoke.  If you have hot flashes, dress in layers.  Take supplements, calcium, and vitamin D to strengthen bones.  You can use over-the-counter lubricants or moisturizers for vaginal dryness.  Group therapy is sometimes very helpful.  Acupuncture may be helpful in some cases. SEEK MEDICAL CARE IF:   You are not sure you are in menopause.  You are having menopausal symptoms and need advice and treatment.  You are still having menstrual periods after age 55 years.  You have pain with intercourse.  Menopause is complete (no menstrual period for 12 months) and you develop vaginal bleeding.  You need a referral to a specialist (gynecologist, psychiatrist, or psychologist) for treatment. SEEK IMMEDIATE MEDICAL CARE IF:   You have severe depression.  You have excessive vaginal bleeding.    You fell and think you have a broken bone.  You have pain when you urinate.  You develop leg or  chest pain.  You have a fast pounding heart beat (palpitations).  You have severe headaches.  You develop vision problems.  You feel a lump in your breast.  You have abdominal pain or severe indigestion. Document Released: 05/28/2003 Document Revised: 11/07/2012 Document Reviewed: 10/04/2012 [facility] Patient Information 2015 [place], [place]. This information is not intended to replace advice given to you by your health care provider. Make sure you discuss any questions you have with your health care provider. Perimenopause Perimenopause is the time when your body begins to move into the menopause (no menstrual period for 12 straight months). It is a natural process. Perimenopause can begin 2-8 years before the menopause and usually lasts for 1 year after the menopause. During this time, your ovaries may or may not produce an egg. The ovaries vary in their production of estrogen and progesterone hormones each month. This can cause irregular menstrual periods, difficulty getting pregnant, vaginal bleeding between periods, and uncomfortable symptoms. CAUSES  Irregular production of the ovarian hormones, estrogen and progesterone, and not ovulating every month.  Other causes include:  Tumor of the pituitary gland in the brain.  Medical disease that affects the ovaries.  Radiation treatment.  Chemotherapy.  Unknown causes.  Heavy smoking and excessive alcohol intake can bring on perimenopause sooner. SIGNS AND SYMPTOMS   Hot flashes.  Night sweats.  Irregular menstrual periods.  Decreased sex drive.  Vaginal dryness.  Headaches.  Mood swings.  Depression.  Memory problems.  Irritability.  Tiredness.  Weight gain.  Trouble getting pregnant.  The beginning of losing bone cells (osteoporosis).  The beginning of hardening of the arteries (atherosclerosis). DIAGNOSIS  Your health care provider will make a diagnosis by analyzing your age, menstrual history, and  symptoms. He or she will do a physical exam and note any changes in your body, especially your female organs. Female hormone tests may or may not be helpful depending on the amount of female hormones you produce and when you produce them. However, other hormone tests may be helpful to rule out other problems. TREATMENT  In some cases, no treatment is needed. The decision on whether treatment is necessary during the perimenopause should be made by you and your health care provider based on how the symptoms are affecting you and your lifestyle. Various treatments are available, such as:  Treating individual symptoms with a specific medicine for that symptom.  Herbal medicines that can help specific symptoms.  Counseling.  Group therapy. HOME CARE INSTRUCTIONS   Keep track of your menstrual periods (when they occur, how heavy they are, how long between periods, and how long they last) as well as your symptoms and when they started.  Only take over-the-counter or prescription medicines as directed by your health care provider.  Sleep and rest.  Exercise.  Eat a diet that contains calcium (good for your bones) and soy (acts like the estrogen hormone).  Do not smoke.  Avoid alcoholic beverages.  Take vitamin supplements as recommended by your health care provider. Taking vitamin E may help in certain cases.  Take calcium and vitamin D supplements to help prevent bone loss.  Group therapy is sometimes helpful.  Acupuncture may help in some cases. SEEK MEDICAL CARE IF:   You have questions about any symptoms you are having.  You need a referral to a specialist (gynecologist, psychiatrist, or psychologist). SEEK IMMEDIATE  MEDICAL CARE IF:   You have vaginal bleeding.  Your period lasts longer than 8 days.  Your periods are recurring sooner than 21 days.  You have bleeding after intercourse.  You have severe depression.  You have pain when you urinate.  You have severe  headaches.  You have vision problems. Document Released: 04/14/2004 Document Revised: 12/26/2012 Document Reviewed: 10/04/2012 [facility] Patient Information 2015 [place], [place]. This information is not intended to replace advice given to you by your health care provider. Make sure you discuss any questions you have with your health care provider.

## 2014-07-10 NOTE — Progress Notes (Signed)
Subjective:        Kylie Wright is a 49 y.o. female here for a routine exam.  Current complaints: none.    Personal health questionnaire:  Is patient [name], have a family history of breast and/or ovarian cancer: no Is there a family history of uterine cancer diagnosed at age < 26, gastrointestinal cancer, urinary tract cancer, family member who is a Field seismologist syndrome-associated carrier: no Is the patient overweight and hypertensive, family history of diabetes, personal history of gestational diabetes, preeclampsia or PCOS: no Is patient over 89, have PCOS,  family history of premature CHD under age 36, diabetes, smoke, have hypertension or peripheral artery disease:  no At any time, has a partner hit, kicked or otherwise hurt or frightened you?: no Over the past 2 weeks, have you felt down, depressed or hopeless?: no Over the past 2 weeks, have you felt little interest or pleasure in doing things?:no   Gynecologic History No LMP recorded. Patient is not currently having periods (Reason: Perimenopausal). Contraception: abstinence Last Pap: 2015. Results were: normal Last mammogram: 2015. Results were: normal  Obstetric History OB History  Gravida Para Term Preterm AB SAB TAB Ectopic Multiple Living  2 1 1  1   1  1     # Outcome Date GA Lbr Len/2nd Weight Sex Delivery Anes PTL Lv  2 Term 2003 [redacted]w[redacted]d   M Vag-Spont EPI N Y  1 Ectopic 1993              Past Medical History  Diagnosis Date  . Interstitial cystitis   . ADHD (attention deficit hyperactivity disorder)   . Bipolar 1 disorder   . History of drug abuse   . History of alcohol abuse     Past Surgical History  Procedure Laterality Date  . Vaginal surgery    . Salpingectomy  1993     Current outpatient prescriptions:  .  asenapine (SAPHRIS) 5 MG SUBL, Place 5 mg under the tongue at bedtime., Disp: , Rfl:  .  Bupivacaine HCl (MARCAINE IJ), Inject as directed 2 (two) times a week. On Mondays & Fridays,  Disp: , Rfl:  .  hydrochlorothiazide (HYDRODIURIL) 50 MG tablet, Take 50 mg by mouth every morning., Disp: , Rfl:  .  lisdexamfetamine (VYVANSE) 50 MG capsule, Take 50 mg by mouth every morning., Disp: , Rfl:  .  meloxicam (MOBIC) 7.5 MG tablet, Take 7.5 mg by mouth 2 (two) times daily as needed. For pain, Disp: , Rfl:  .  Olopatadine HCl 0.7 % SOLN, Apply to eye., Disp: , Rfl:  .  pentosan polysulfate (ELMIRON) 100 MG capsule, Take 200 mg by mouth 2 (two) times daily., Disp: , Rfl:  .  rosuvastatin (CRESTOR) 10 MG tablet, Take 10 mg by mouth daily., Disp: , Rfl:  .  solifenacin (VESICARE) 5 MG tablet, Take 10 mg by mouth at bedtime., Disp: , Rfl:  No Known Allergies  History  Substance Use Topics  . Smoking status: Current Every Day Smoker -- 0.50 packs/day    Types: Cigarettes  . Smokeless tobacco: Never Used  . Alcohol Use: No    Family History  Problem Relation Age of Onset  . Hypertension    . Diabetes    . Mental illness    . Cancer    . Cancer Sister 3    small intestine   . Alcoholism        Review of Systems  Constitutional: negative for fatigue and weight loss  Respiratory: negative for cough and wheezing Cardiovascular: negative for chest pain, fatigue and palpitations Gastrointestinal: negative for abdominal pain and change in bowel habits Musculoskeletal:negative for myalgias Neurological: negative for gait problems and tremors Behavioral/Psych: negative for abusive relationship, depression Endocrine: negative for temperature intolerance   Genitourinary:negative for abnormal menstrual periods, genital lesions, hot flashes, sexual problems and vaginal discharge Integument/breast: negative for breast lump, breast tenderness, nipple discharge and skin lesion(s)    Objective:       BP 156/103 mmHg  Pulse 89  Temp(Src) 98 F (36.7 C)  Ht 5' 2.5" (1.588 m)  Wt 193 lb (87.544 kg)  BMI 34.72 kg/m2 General:   alert  Skin:   no rash or abnormalities  Lungs:    clear to auscultation bilaterally  Heart:   regular rate and rhythm, S1, S2 normal, no murmur, click, rub or gallop  Breasts:   normal without suspicious masses, skin or nipple changes or axillary nodes  Abdomen:  normal findings: no organomegaly, soft, non-tender and no hernia  Pelvis:  External genitalia: normal general appearance Urinary system: urethral meatus normal and bladder without fullness, nontender Vaginal: normal without tenderness, induration or masses Cervix: normal appearance Adnexa: normal bimanual exam Uterus: anteverted and non-tender, normal size   Lab Review Urine pregnancy test Labs reviewed yes Radiologic studies reviewed yes    Assessment:    Healthy female exam.    Perimenopause   Plan:    Education reviewed: calcium supplements, depression evaluation, low fat, low cholesterol diet, self breast exams and weight bearing exercise. Follow up in: 1 year.   Meds ordered this encounter  Medications  . rosuvastatin (CRESTOR) 10 MG tablet    Sig: Take 10 mg by mouth daily.  . Olopatadine HCl 0.7 % SOLN    Sig: Apply to eye.   No orders of the defined types were placed in this encounter.

## 2014-07-14 LAB — SURESWAB BACTERIAL VAGINOSIS/ITIS
Atopobium vaginae: 7.4 Log (cells/mL)
C. albicans, DNA: NOT DETECTED
C. glabrata, DNA: NOT DETECTED
C. parapsilosis, DNA: NOT DETECTED
C. tropicalis, DNA: NOT DETECTED
Gardnerella vaginalis: >8 Log (cells/mL)
LACTOBACILLUS SPECIES: NOT DETECTED Log (cells/mL)
MEGASPHAERA SPECIES: >8 Log (cells/mL)
T. vaginalis RNA, QL TMA: NOT DETECTED

## 2014-07-14 LAB — PAP IG AND HPV HIGH-RISK: HPV DNA High Risk: NOT DETECTED

## 2014-07-15 ENCOUNTER — Other Ambulatory Visit: Payer: Self-pay | Admitting: Obstetrics

## 2014-07-17 ENCOUNTER — Ambulatory Visit: Payer: BLUE CROSS/BLUE SHIELD | Admitting: Cardiology

## 2014-09-18 ENCOUNTER — Other Ambulatory Visit: Payer: Self-pay | Admitting: Obstetrics

## 2014-09-18 DIAGNOSIS — Z1231 Encounter for screening mammogram for malignant neoplasm of breast: Secondary | ICD-10-CM

## 2014-09-19 ENCOUNTER — Ambulatory Visit (HOSPITAL_COMMUNITY)
Admission: RE | Admit: 2014-09-19 | Discharge: 2014-09-19 | Disposition: A | Discharge status: Home / Self Care | Payer: BLUE CROSS/BLUE SHIELD | Source: Ambulatory Visit | Attending: Obstetrics | Admitting: Obstetrics

## 2014-09-19 DIAGNOSIS — Z1231 Encounter for screening mammogram for malignant neoplasm of breast: Secondary | ICD-10-CM | POA: Diagnosis not present

## 2015-07-15 ENCOUNTER — Ambulatory Visit: Payer: Self-pay | Admitting: Obstetrics

## 2015-08-14 ENCOUNTER — Encounter: Payer: Self-pay | Admitting: Internal Medicine

## 2015-08-19 ENCOUNTER — Encounter: Payer: Self-pay | Admitting: Obstetrics

## 2015-08-19 ENCOUNTER — Ambulatory Visit (INDEPENDENT_AMBULATORY_CARE_PROVIDER_SITE_OTHER): Payer: BLUE CROSS/BLUE SHIELD | Admitting: Obstetrics

## 2015-08-19 VITALS — BP 141/102 | HR 96 | Temp 98.4°F | Wt 194.0 lb

## 2015-08-19 DIAGNOSIS — Z01419 Encounter for gynecological examination (general) (routine) without abnormal findings: Secondary | ICD-10-CM

## 2015-08-19 DIAGNOSIS — Z1389 Encounter for screening for other disorder: Secondary | ICD-10-CM

## 2015-08-19 DIAGNOSIS — Z0000 Encounter for general adult medical examination without abnormal findings: Secondary | ICD-10-CM

## 2015-08-19 DIAGNOSIS — Z780 Asymptomatic menopausal state: Secondary | ICD-10-CM

## 2015-08-19 LAB — POCT URINALYSIS DIPSTICK
Bilirubin, UA: NEGATIVE
Blood, UA: NEGATIVE
Glucose, UA: NEGATIVE
Ketones, UA: NEGATIVE
Leukocytes, UA: NEGATIVE
Nitrite, UA: NEGATIVE
Protein, UA: NEGATIVE
Spec Grav, UA: <=1.005
Urobilinogen, UA: NEGATIVE
pH, UA: 6

## 2015-08-19 NOTE — Progress Notes (Addendum)
Subjective:        Kylie Wright is a 50 y.o. female here for a routine exam.  Current complaints: Hot flashes.    Personal health questionnaire:  Is patient [name], have a family history of breast and/or ovarian cancer: no Is there a family history of uterine cancer diagnosed at age < 46, gastrointestinal cancer, urinary tract cancer, family member who is a Field seismologist syndrome-associated carrier: no Is the patient overweight and hypertensive, family history of diabetes, personal history of gestational diabetes, preeclampsia or PCOS: no Is patient over 69, have PCOS,  family history of premature CHD under age 56, diabetes, smoke, have hypertension or peripheral artery disease:  no At any time, has a partner hit, kicked or otherwise hurt or frightened you?: no Over the past 2 weeks, have you felt down, depressed or hopeless?: no Over the past 2 weeks, have you felt little interest or pleasure in doing things?:no   Gynecologic History No LMP recorded. Patient is not currently having periods (Reason: Perimenopausal). Contraception: post menopausal status Last Pap: 2016. Results were: normal Last mammogram: 2016. Results were: normal  Obstetric History OB History  Gravida Para Term Preterm AB SAB TAB Ectopic Multiple Living  2 1 1  1   1  1     # Outcome Date GA Lbr Len/2nd Weight Sex Delivery Anes PTL Lv  2 Term 2003 [redacted]w[redacted]d   M Vag-Spont EPI N Y  1 Ectopic 1993              Past Medical History  Diagnosis Date  . Interstitial cystitis   . ADHD (attention deficit hyperactivity disorder)   . Bipolar 1 disorder ([place])   . History of drug abuse   . History of alcohol abuse     Past Surgical History  Procedure Laterality Date  . Vaginal surgery    . Salpingectomy  1993     Current outpatient prescriptions:  .  ACETAMINOPHEN-CODEINE #3 PO, Take by mouth as needed., Disp: , Rfl:  .  amphetamine-dextroamphetamine (ADDERALL XR) 20 MG 24 hr capsule, Take 20 mg by mouth  daily., Disp: , Rfl:  .  Bupivacaine HCl (MARCAINE IJ), Inject as directed 2 (two) times a week. On Mondays & Fridays, Disp: , Rfl:  .  hydrochlorothiazide (HYDRODIURIL) 50 MG tablet, Take 50 mg by mouth every morning., Disp: , Rfl:  .  Ibuprofen (ADVIL) 200 MG CAPS, Take by mouth as needed., Disp: , Rfl:  .  loratadine (CLARITIN) 10 MG tablet, Take 10 mg by mouth daily as needed for allergies., Disp: , Rfl:  .  meloxicam (MOBIC) 7.5 MG tablet, Take 7.5 mg by mouth 2 (two) times daily as needed. For pain, Disp: , Rfl:  .  OLANZapine (ZYPREXA) 5 MG tablet, Take 5 mg by mouth at bedtime., Disp: , Rfl:  .  Olopatadine HCl 0.7 % SOLN, Apply to eye., Disp: , Rfl:  .  pentosan polysulfate (ELMIRON) 100 MG capsule, Take 200 mg by mouth 2 (two) times daily., Disp: , Rfl:  .  rosuvastatin (CRESTOR) 10 MG tablet, Take 10 mg by mouth daily., Disp: , Rfl:  .  solifenacin (VESICARE) 5 MG tablet, Take 10 mg by mouth at bedtime., Disp: , Rfl:  .  lisdexamfetamine (VYVANSE) 50 MG capsule, Take 50 mg by mouth every morning., Disp: , Rfl:  No Known Allergies  Social History  Substance Use Topics  . Smoking status: Current Every Day Smoker -- 0.50 packs/day    Types: Cigarettes  .  Smokeless tobacco: Never Used  . Alcohol Use: No    Family History  Problem Relation Age of Onset  . Hypertension    . Diabetes    . Mental illness    . Cancer    . Cancer Sister 37    small intestine   . Alcoholism        Review of Systems  Constitutional: negative for fatigue and weight loss Respiratory: negative for cough and wheezing Cardiovascular: negative for chest pain, fatigue and palpitations Gastrointestinal: negative for abdominal pain and change in bowel habits Musculoskeletal:negative for myalgias Neurological: negative for gait problems and tremors Behavioral/Psych: negative for abusive relationship, depression Endocrine: positive for hot flashes Genitourinary:negative for abnormal menstrual periods,  genital lesions, sexual problems and vaginal discharge Integument/breast: negative for breast lump, breast tenderness, nipple discharge and skin lesion(s)    Objective:       BP 141/102 mmHg  Pulse 96  Temp(Src) 98.4 F (36.9 C)  Wt 194 lb (87.998 kg) General:   alert  Skin:   no rash or abnormalities  Lungs:   clear to auscultation bilaterally  Heart:   regular rate and rhythm, S1, S2 normal, no murmur, click, rub or gallop  Breasts:   normal without suspicious masses, skin or nipple changes or axillary nodes  Abdomen:  normal findings: no organomegaly, soft, non-tender and no hernia  Pelvis:  External genitalia: normal general appearance Urinary system: urethral meatus normal and bladder without fullness, nontender Vaginal: normal without tenderness, induration or masses Cervix: normal appearance Adnexa: normal bimanual exam Uterus: anteverted and non-tender, normal size   Lab Review Urine pregnancy test Labs reviewed yes Radiologic studies reviewed yes    Assessment:    Healthy female exam.    Menopausal state.  Vasomotor symptoms.  Stable.   Plan:    Education reviewed: calcium supplements, depression evaluation, low fat, low cholesterol diet, self breast exams and weight bearing exercise. Mammogram ordered. Follow up in: 1 year.   Meds ordered this encounter  Medications  . Ibuprofen (ADVIL) 200 MG CAPS    Sig: Take by mouth as needed.  Marland Kitchen amphetamine-dextroamphetamine (ADDERALL XR) 20 MG 24 hr capsule    Sig: Take 20 mg by mouth daily.  Marland Kitchen OLANZapine (ZYPREXA) 5 MG tablet    Sig: Take 5 mg by mouth at bedtime.  Marland Kitchen loratadine (CLARITIN) 10 MG tablet    Sig: Take 10 mg by mouth daily as needed for allergies.  . ACETAMINOPHEN-CODEINE #3 PO    Sig: Take by mouth as needed.   Orders Placed This Encounter  Procedures  . POCT urinalysis dipstick

## 2015-08-19 NOTE — Addendum Note (Signed)
Addended by: Lewie Loron D on: 08/19/2015 02:12 PM   Modules accepted: Orders

## 2015-08-22 LAB — NUSWAB VG, CANDIDA 6SP
Candida albicans, NAA: NEGATIVE
Candida glabrata, NAA: NEGATIVE
Candida krusei, NAA: NEGATIVE
Candida lusitaniae, NAA: NEGATIVE
Candida parapsilosis, NAA: NEGATIVE
Candida tropicalis, NAA: NEGATIVE
Megasphaera 1: HIGH Score — AB
Trich vag by NAA: NEGATIVE

## 2015-08-24 LAB — PAP IG AND HPV HIGH-RISK
HPV, high-risk: NEGATIVE
PAP Smear Comment: 0

## 2015-08-26 ENCOUNTER — Encounter: Payer: Self-pay | Admitting: *Deleted

## 2015-10-05 ENCOUNTER — Emergency Department: Payer: Worker's Compensation

## 2015-10-05 ENCOUNTER — Emergency Department
Admission: EM | Admit: 2015-10-05 | Discharge: 2015-10-05 | Disposition: A | Discharge status: Home / Self Care | Payer: Worker's Compensation | Attending: Emergency Medicine | Admitting: Emergency Medicine

## 2015-10-05 DIAGNOSIS — S20212A Contusion of left front wall of thorax, initial encounter: Secondary | ICD-10-CM | POA: Diagnosis not present

## 2015-10-05 DIAGNOSIS — Z791 Long term (current) use of non-steroidal anti-inflammatories (NSAID): Secondary | ICD-10-CM | POA: Diagnosis not present

## 2015-10-05 DIAGNOSIS — F17210 Nicotine dependence, cigarettes, uncomplicated: Secondary | ICD-10-CM | POA: Diagnosis not present

## 2015-10-05 DIAGNOSIS — Z79899 Other long term (current) drug therapy: Secondary | ICD-10-CM | POA: Insufficient documentation

## 2015-10-05 DIAGNOSIS — M542 Cervicalgia: Secondary | ICD-10-CM | POA: Diagnosis present

## 2015-10-05 DIAGNOSIS — Y999 Unspecified external cause status: Secondary | ICD-10-CM | POA: Insufficient documentation

## 2015-10-05 DIAGNOSIS — S161XXA Strain of muscle, fascia and tendon at neck level, initial encounter: Secondary | ICD-10-CM

## 2015-10-05 DIAGNOSIS — F909 Attention-deficit hyperactivity disorder, unspecified type: Secondary | ICD-10-CM | POA: Diagnosis not present

## 2015-10-05 DIAGNOSIS — Y92410 Unspecified street and highway as the place of occurrence of the external cause: Secondary | ICD-10-CM | POA: Insufficient documentation

## 2015-10-05 DIAGNOSIS — Y9389 Activity, other specified: Secondary | ICD-10-CM | POA: Diagnosis not present

## 2015-10-05 DIAGNOSIS — F319 Bipolar disorder, unspecified: Secondary | ICD-10-CM | POA: Insufficient documentation

## 2015-10-05 MED ORDER — METHOCARBAMOL 500 MG PO TABS: 500.0000 mg | ORAL_TABLET | Freq: Four times a day (QID) | ORAL | Status: DC | PRN

## 2015-10-05 NOTE — Discharge Instructions (Signed)
Cervical Sprain  A cervical sprain is an injury in the neck in which the strong, fibrous tissues (ligaments) that connect your neck bones stretch or tear. Cervical sprains can range from mild to severe. Severe cervical sprains can cause the neck vertebrae to be unstable. This can lead to damage of the spinal cord and can result in serious nervous system problems. The amount of time it takes for a cervical sprain to get better depends on the cause and extent of the injury. Most cervical sprains heal in 1 to 3 weeks.  CAUSES   Severe cervical sprains may be caused by:    Contact sport injuries (such as from football, rugby, wrestling, hockey, auto racing, gymnastics, diving, martial arts, or boxing).    Motor vehicle collisions.    Whiplash injuries. This is an injury from a sudden forward and backward whipping movement of the head and neck.   Falls.   Mild cervical sprains may be caused by:    Being in an awkward position, such as while cradling a telephone between your ear and shoulder.    Sitting in a chair that does not offer proper support.    Working at a poorly designed computer station.    Looking up or down for long periods of time.   SYMPTOMS    Pain, soreness, stiffness, or a burning sensation in the front, back, or sides of the neck. This discomfort may develop immediately after the injury or slowly, 24 hours or more after the injury.    Pain or tenderness directly in the middle of the back of the neck.    Shoulder or upper back pain.    Limited ability to move the neck.    Headache.    Dizziness.    Weakness, numbness, or tingling in the hands or arms.    Muscle spasms.    Difficulty swallowing or chewing.    Tenderness and swelling of the neck.   DIAGNOSIS   Most of the time your health care provider can diagnose a cervical sprain by taking your history and doing a physical exam. Your health care provider will ask about previous neck injuries and any known neck  problems, such as arthritis in the neck. X-rays may be taken to find out if there are any other problems, such as with the bones of the neck. Other tests, such as a CT scan or MRI, may also be needed.   TREATMENT   Treatment depends on the severity of the cervical sprain. Mild sprains can be treated with rest, keeping the neck in place (immobilization), and pain medicines. Severe cervical sprains are immediately immobilized. Further treatment is done to help with pain, muscle spasms, and other symptoms and may include:   Medicines, such as pain relievers, numbing medicines, or muscle relaxants.    Physical therapy. This may involve stretching exercises, strengthening exercises, and posture training. Exercises and improved posture can help stabilize the neck, strengthen muscles, and help stop symptoms from returning.   HOME CARE INSTRUCTIONS    Put ice on the injured area.     Put ice in a plastic bag.     Place a towel between your skin and the bag.     Leave the ice on for 15-20 minutes, 3-4 times a day.    If your injury was severe, you may have been given a cervical collar to wear. A cervical collar is a two-piece collar designed to keep your neck from moving while it heals.      Do not remove the collar unless instructed by your health care provider.    If you have long hair, keep it outside of the collar.    Ask your health care provider before making any adjustments to your collar. Minor adjustments may be required over time to improve comfort and reduce pressure on your chin or on the back of your head.    Ifyou are allowed to remove the collar for cleaning or bathing, follow your health care provider's instructions on how to do so safely.    Keep your collar clean by wiping it with mild soap and water and drying it completely. If the collar you have been given includes removable pads, remove them every 1-2 days and hand wash them with soap and water. Allow them to air dry. They should be completely  dry before you wear them in the collar.    If you are allowed to remove the collar for cleaning and bathing, wash and dry the skin of your neck. Check your skin for irritation or sores. If you see any, tell your health care provider.    Do not drive while wearing the collar.    Only take over-the-counter or prescription medicines for pain, discomfort, or fever as directed by your health care provider.    Keep all follow-up appointments as directed by your health care provider.    Keep all physical therapy appointments as directed by your health care provider.    Make any needed adjustments to your workstation to promote good posture.    Avoid positions and activities that make your symptoms worse.    Warm up and stretch before being active to help prevent problems.   SEEK MEDICAL CARE IF:    Your pain is not controlled with medicine.    You are unable to decrease your pain medicine over time as planned.    Your activity level is not improving as expected.   SEEK IMMEDIATE MEDICAL CARE IF:    You develop any bleeding.   You develop stomach upset.   You have signs of an allergic reaction to your medicine.    Your symptoms get worse.    You develop new, unexplained symptoms.    You have numbness, tingling, weakness, or paralysis in any part of your body.   MAKE SURE YOU:    Understand these instructions.   Will watch your condition.   Will get help right away if you are not doing well or get worse.     This information is not intended to replace advice given to you by your health care provider. Make sure you discuss any questions you have with your health care provider.     Document Released: 01/02/2007 Document Revised: 03/12/2013 Document Reviewed: 09/12/2012  Elsevier Interactive Patient Education 2016 Elsevier Inc[employer]
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
## 2016-01-01 ENCOUNTER — Other Ambulatory Visit: Payer: Self-pay | Admitting: Internal Medicine

## 2016-01-01 ENCOUNTER — Encounter: Payer: Self-pay | Admitting: Internal Medicine

## 2016-01-01 ENCOUNTER — Ambulatory Visit (INDEPENDENT_AMBULATORY_CARE_PROVIDER_SITE_OTHER): Payer: BLUE CROSS/BLUE SHIELD | Admitting: Internal Medicine

## 2016-01-01 VITALS — BP 144/92 | HR 81 | Temp 98.2°F | Resp 16 | Ht 63.0 in | Wt 195.0 lb

## 2016-01-01 DIAGNOSIS — F4310 Post-traumatic stress disorder, unspecified: Secondary | ICD-10-CM | POA: Insufficient documentation

## 2016-01-01 DIAGNOSIS — E785 Hyperlipidemia, unspecified: Secondary | ICD-10-CM | POA: Diagnosis not present

## 2016-01-01 DIAGNOSIS — R55 Syncope and collapse: Secondary | ICD-10-CM | POA: Insufficient documentation

## 2016-01-01 DIAGNOSIS — F1911 Other psychoactive substance abuse, in remission: Secondary | ICD-10-CM

## 2016-01-01 DIAGNOSIS — M25552 Pain in left hip: Secondary | ICD-10-CM

## 2016-01-01 DIAGNOSIS — M19072 Primary osteoarthritis, left ankle and foot: Secondary | ICD-10-CM

## 2016-01-01 DIAGNOSIS — M19071 Primary osteoarthritis, right ankle and foot: Secondary | ICD-10-CM

## 2016-01-01 DIAGNOSIS — Z1231 Encounter for screening mammogram for malignant neoplasm of breast: Secondary | ICD-10-CM

## 2016-01-01 DIAGNOSIS — F988 Other specified behavioral and emotional disorders with onset usually occurring in childhood and adolescence: Secondary | ICD-10-CM

## 2016-01-01 DIAGNOSIS — N3010 Interstitial cystitis (chronic) without hematuria: Secondary | ICD-10-CM

## 2016-01-01 DIAGNOSIS — Z87898 Personal history of other specified conditions: Secondary | ICD-10-CM

## 2016-01-01 DIAGNOSIS — Z23 Encounter for immunization: Secondary | ICD-10-CM | POA: Diagnosis not present

## 2016-01-01 DIAGNOSIS — F1011 Alcohol abuse, in remission: Secondary | ICD-10-CM

## 2016-01-01 DIAGNOSIS — M25551 Pain in right hip: Secondary | ICD-10-CM

## 2016-01-01 DIAGNOSIS — F319 Bipolar disorder, unspecified: Secondary | ICD-10-CM

## 2016-01-01 MED ORDER — HYDROCHLOROTHIAZIDE 50 MG PO TABS: 50.0000 mg | ORAL_TABLET | Freq: Every morning | ORAL | 5 refills | Status: DC

## 2016-01-01 MED ORDER — MELOXICAM 7.5 MG PO TABS: 7.5000 mg | ORAL_TABLET | Freq: Two times a day (BID) | ORAL | 5 refills | Status: DC | PRN

## 2016-01-01 MED ORDER — PENTOSAN POLYSULFATE SODIUM 100 MG PO CAPS: 200.0000 mg | ORAL_CAPSULE | Freq: Two times a day (BID) | ORAL | 5 refills | Status: DC

## 2016-01-01 MED ORDER — ROSUVASTATIN CALCIUM 10 MG PO TABS: 10.0000 mg | ORAL_TABLET | Freq: Every day | ORAL | 5 refills | Status: DC

## 2016-01-01 MED ORDER — OLOPATADINE HCL 0.7 % OP SOLN: 1.0000 [drp] | Freq: Every day | OPHTHALMIC | 5 refills | Status: DC

## 2016-01-01 MED ORDER — SOLIFENACIN SUCCINATE 5 MG PO TABS: 5.0000 mg | ORAL_TABLET | Freq: Every day | ORAL | 5 refills | Status: DC

## 2016-01-01 NOTE — Assessment & Plan Note (Signed)
Management per psychiatry 

## 2016-01-01 NOTE — Assessment & Plan Note (Addendum)
Not currently following with urology Taking elmiron, vesicare, hctz daily Will continue current medications Will return to urology annually

## 2016-01-01 NOTE — Assessment & Plan Note (Signed)
Continue meloxicam

## 2016-01-01 NOTE — Patient Instructions (Addendum)
All other Health Maintenance issues reviewed.   All recommended immunizations and age-appropriate screenings are up-to-date or discussed.  Flu vaccine administered today.   Medications reviewed and updated.  No changes recommended at this time.  Your prescription(s) have been submitted to your pharmacy. Please take as directed and contact our office if you believe you are having problem(s) with the medication(s).   Please followup in 6 months    Steps to Quit Smoking  Smoking tobacco can be harmful to your health and can affect almost every organ in your body. Smoking puts you, and those around you, at risk for developing many serious chronic diseases. Quitting smoking is difficult, but it is one of the best things that you can do for your health. It is never too late to quit. WHAT ARE THE BENEFITS OF QUITTING SMOKING? When you quit smoking, you lower your risk of developing serious diseases and conditions, such as:  Lung cancer or lung disease, such as COPD.  Heart disease.  Stroke.  Heart attack.  Infertility.  Osteoporosis and bone fractures. Additionally, symptoms such as coughing, wheezing, and shortness of breath may get better when you quit. You may also find that you get sick less often because your body is stronger at fighting off colds and infections. If you are pregnant, quitting smoking can help to reduce your chances of having a baby of low birth weight. HOW DO I GET READY TO QUIT? When you decide to quit smoking, create a plan to make sure that you are successful. Before you quit:  Pick a date to quit. Set a date within the next two weeks to give you time to prepare.  Write down the reasons why you are quitting. Keep this list in places where you will see it often, such as on your bathroom mirror or in your car or wallet.  Identify the people, places, things, and activities that make you want to smoke (triggers) and avoid them. Make sure to take these  actions:  Throw away all cigarettes at home, at work, and in your car.  Throw away smoking accessories, such as Scientist, research (medical).  Clean your car and make sure to empty the ashtray.  Clean your home, including curtains and carpets.  Tell your family, friends, and coworkers that you are quitting. Support from your loved ones can make quitting easier.  Talk with your health care provider about your options for quitting smoking.  Find out what treatment options are covered by your health insurance. WHAT STRATEGIES CAN I USE TO QUIT SMOKING?  Talk with your healthcare provider about different strategies to quit smoking. Some strategies include:  Quitting smoking altogether instead of gradually lessening how much you smoke over a period of time. Research shows that quitting "cold [place]" is more successful than gradually quitting.  Attending in-person counseling to help you build problem-solving skills. You are more likely to have success in quitting if you attend several counseling sessions. Even short sessions of 10 minutes can be effective.  Finding resources and support systems that can help you to quit smoking and remain smoke-free after you quit. These resources are most helpful when you use them often. They can include:  Online chats with a Social worker.  Telephone quitlines.  Printed Furniture conservator/restorer.  Support groups or group counseling.  Text messaging programs.  Mobile phone applications.  Taking medicines to help you quit smoking. (If you are pregnant or breastfeeding, talk with your health care provider first.) Some medicines contain nicotine  and some do not. Both types of medicines help with cravings, but the medicines that include nicotine help to relieve withdrawal symptoms. Your health care provider may recommend:  Nicotine patches, gum, or lozenges.  Nicotine inhalers or sprays.  Non-nicotine medicine that is taken by mouth. Talk with your health care  provider about combining strategies, such as taking medicines while you are also receiving in-person counseling. Using these two strategies together makes you more likely to succeed in quitting than if you used either strategy on its own. If you are pregnant or breastfeeding, talk with your health care provider about finding counseling or other support strategies to quit smoking. Do not take medicine to help you quit smoking unless told to do so by your health care provider. WHAT THINGS CAN I DO TO MAKE IT EASIER TO QUIT? Quitting smoking might feel overwhelming at first, but there is a lot that you can do to make it easier. Take these important actions:  Reach out to your family and friends and ask that they support and encourage you during this time. Call telephone quitlines, reach out to support groups, or work with a counselor for support.  Ask people who smoke to avoid smoking around you.  Avoid places that trigger you to smoke, such as bars, parties, or smoke-break areas at work.  Spend time around people who do not smoke.  Lessen stress in your life, because stress can be a smoking trigger for some people. To lessen stress, try:  Exercising regularly.  Deep-breathing exercises.  Yoga.  Meditating.  Performing a body scan. This involves closing your eyes, scanning your body from head to toe, and noticing which parts of your body are particularly tense. Purposefully relax the muscles in those areas.  Download or purchase mobile phone or tablet apps (applications) that can help you stick to your quit plan by providing reminders, tips, and encouragement. There are many free apps, such as QuitGuide from the [employer] Office manager for Disease Control and Prevention). You can find other support for quitting smoking (smoking cessation) through smokefree.gov and other websites. HOW WILL I FEEL WHEN I QUIT SMOKING? Within the first 24 hours of quitting smoking, you may start to feel some withdrawal  symptoms. These symptoms are usually most noticeable 2-3 days after quitting, but they usually do not last beyond 2-3 weeks. Changes or symptoms that you might experience include:  Mood swings.  Restlessness, anxiety, or irritation.  Difficulty concentrating.  Dizziness.  Strong cravings for sugary foods in addition to nicotine.  Mild weight gain.  Constipation.  Nausea.  Coughing or a sore throat.  Changes in how your medicines work in your body.  A depressed mood.  Difficulty sleeping (insomnia). After the first 2-3 weeks of quitting, you may start to notice more positive results, such as:  Improved sense of smell and taste.  Decreased coughing and sore throat.  Slower heart rate.  Lower blood pressure.  Clearer skin.  The ability to breathe more easily.  Fewer sick days. Quitting smoking is very challenging for most people. Do not get discouraged if you are not successful the first time. Some people need to make many attempts to quit before they achieve long-term success. Do your best to stick to your quit plan, and talk with your health care provider if you have any questions or concerns.   This information is not intended to replace advice given to you by your health care provider. Make sure you discuss any questions you have with  your health care provider.   Document Released: 03/01/2001 Document Revised: 07/22/2014 Document Reviewed: 07/22/2014 Elsevier Interactive Patient Education [employer].

## 2016-01-01 NOTE — Assessment & Plan Note (Signed)
Taking crestor daily Continue same

## 2016-01-01 NOTE — Assessment & Plan Note (Signed)
Taking meloxicam  Continue same

## 2016-01-01 NOTE — Progress Notes (Signed)
Pre visit review using our clinic review tool, if applicable. No additional management support is needed unless otherwise documented below in the visit note. 

## 2016-01-01 NOTE — Progress Notes (Signed)
Subjective:    Patient ID: Kylie Wright, female    DOB: 11/24/1965, 50 y.o.   MRN: [id number]  HPI She is here to establish with a new pcp.    Syncope:  Her first  Episode of syncope was in march of 2016.  She went to the ED.  She did see Cardiology and there was no cardiac cause.  She has had presyncopal symptoms, but has not had another episode of syncope.   She thinks some of her presyncopal symptoms are related to anxiety  Bipolar, anxiety, ADD:  She follows with psychiatry.  She is taking all her medications as prescribed.   Hip pain, lower back pain, feet pain:  She is taking meloxicam daily and that does help with her pain.    IC:  She is taking hctz, elmiron and vesicare daily for her IC.   She has not seen urology recently.  She felt she could follow up only as needed.  She was instructed to follow up annually.    Hyperlipidemia: She is taking her medication daily. She is compliant with a low fat/cholesterol diet. She denies myalgias. She is not exercising regularly.    Medications and allergies reviewed with patient and updated if appropriate.  Patient Active Problem List   Diagnosis Date Noted  . ADD (attention deficit disorder) 01/01/2016  . Syncope 01/01/2016  . History of alcohol abuse 01/01/2016  . History of drug abuse 01/01/2016  . Bipolar disorder ([place]) 01/01/2016  . Hyperlipidemia 01/01/2016  . PTSD (post-traumatic stress disorder) 01/01/2016  . Osteoarthritis of both feet 01/01/2016  . Bilateral hip pain 01/01/2016  . IC (interstitial cystitis) 07/25/2012    Current Outpatient Prescriptions on File Prior to Visit  Medication Sig Dispense Refill  . amphetamine-dextroamphetamine (ADDERALL XR) 20 MG 24 hr capsule Take 20 mg by mouth daily.    . Ibuprofen (ADVIL) 200 MG CAPS Take by mouth as needed.    . loratadine (CLARITIN) 10 MG tablet Take 10 mg by mouth daily as needed for allergies.    Marland Kitchen OLANZapine (ZYPREXA) 5 MG tablet Take 5 mg by mouth at bedtime.       No current facility-administered medications on file prior to visit.     Past Medical History:  Diagnosis Date  . ADHD (attention deficit hyperactivity disorder)   . Bipolar 1 disorder ([place])   . History of alcohol abuse   . History of drug abuse   . Interstitial cystitis     Past Surgical History:  Procedure Laterality Date  . ADENOIDECTOMY  1971  . APPENDECTOMY    . SALPINGECTOMY  1993  . vaginal surgery      Social History   Social History  . Marital status: Single    Spouse name: N/A  . Number of children: 1  . Years of education: N/A   Occupational History  .  [employer]   Social History Main Topics  . Smoking status: Current Every Day Smoker    Packs/day: 0.50    Types: Cigarettes  . Smokeless tobacco: Never Used  . Alcohol use No  . Drug use: No     Comment: h/o abuse hasn't used since 2002  . Sexual activity: Not Currently    Partners: Male    Birth control/ protection: None   Other Topics Concern  . None   Social History Narrative   Exercise:  Walks a lot, tries to stretch     Family History  Problem Relation Age  of Onset  . Arthritis Mother   . Hyperlipidemia Mother   . Heart disease Mother   . Stroke Mother   . Hypertension Mother   . Diabetes Mother   . Mental illness Mother   . Alcohol abuse Father   . Hypertension    . Diabetes    . Mental illness    . Cancer    . Cancer Sister 1    small intestine   . Alcoholism      Review of Systems  Constitutional: Negative for chills and fever.  Eyes: Negative for visual disturbance.  Respiratory: Negative for cough, shortness of breath and wheezing.   Cardiovascular: Positive for palpitations (with anxeity). Negative for chest pain and leg swelling.  Gastrointestinal: Positive for abdominal pain (IC pain only) and constipation. Negative for blood in stool and diarrhea.       Rare gerd  Musculoskeletal: Positive for arthralgias (hips, feet) and back pain.  Neurological:  Positive for light-headedness (occ with fainting spells). Negative for dizziness, numbness and headaches.  Psychiatric/Behavioral: Positive for dysphoric mood. The patient is nervous/anxious.        Objective:   Vitals:   01/01/16 0901  BP: (!) 144/92  Pulse: 81  Resp: 16  Temp: 98.2 F (36.8 C)   Filed Weights   01/01/16 0901  Weight: 195 lb (88.5 kg)   Body mass index is 34.54 kg/m.   Physical Exam Constitutional: She appears well-developed and well-nourished. No distress.  HENT:  Head: Normocephalic and atraumatic.  Right Ear: External ear normal. Normal ear canal and TM Left Ear: External ear normal.  Normal ear canal and TM Mouth/Throat: Oropharynx is clear and moist.  Eyes: Conjunctivae  normal.  Neck: Neck supple. No tracheal deviation present. No thyromegaly present.  No carotid bruit  Cardiovascular: Normal rate, regular rhythm and normal heart sounds.   No murmur heard.  No edema. Pulmonary/Chest: Effort normal and breath sounds normal. No respiratory distress. She has no wheezes. She has no rales.  Abdominal: Soft. She exhibits no distension. There is no tenderness.  Lymphadenopathy: She has no cervical adenopathy.  Skin: Skin is warm and dry. She is not diaphoretic.  Psychiatric: She has a normal mood and affect. Her behavior is normal.         Assessment & Plan:   See Problem List for Assessment and Plan of chronic medical problems.

## 2016-01-22 ENCOUNTER — Ambulatory Visit
Admission: RE | Admit: 2016-01-22 | Discharge: 2016-01-22 | Disposition: A | Discharge status: Home / Self Care | Payer: BLUE CROSS/BLUE SHIELD | Source: Ambulatory Visit | Attending: Internal Medicine | Admitting: Internal Medicine

## 2016-01-22 ENCOUNTER — Ambulatory Visit: Payer: Self-pay

## 2016-01-22 DIAGNOSIS — Z1231 Encounter for screening mammogram for malignant neoplasm of breast: Secondary | ICD-10-CM

## 2016-07-01 ENCOUNTER — Ambulatory Visit: Payer: Self-pay | Admitting: Internal Medicine

## 2016-07-10 ENCOUNTER — Encounter (HOSPITAL_COMMUNITY): Payer: Self-pay | Admitting: Emergency Medicine

## 2016-07-10 ENCOUNTER — Ambulatory Visit (HOSPITAL_COMMUNITY)
Admission: EM | Admit: 2016-07-10 | Discharge: 2016-07-10 | Disposition: A | Discharge status: Home / Self Care | Payer: BLUE CROSS/BLUE SHIELD | Attending: Family Medicine | Admitting: Family Medicine

## 2016-07-10 DIAGNOSIS — K0889 Other specified disorders of teeth and supporting structures: Secondary | ICD-10-CM | POA: Diagnosis not present

## 2016-07-10 DIAGNOSIS — K047 Periapical abscess without sinus: Secondary | ICD-10-CM

## 2016-07-10 MED ORDER — KETOROLAC TROMETHAMINE 60 MG/2ML IM SOLN
60.0000 mg | Freq: Once | INTRAMUSCULAR | Status: AC
  Administered 2016-07-10: 60 mg via INTRAMUSCULAR

## 2016-07-10 MED ORDER — AMOXICILLIN 875 MG PO TABS: 875.0000 mg | ORAL_TABLET | Freq: Two times a day (BID) | ORAL | 0 refills | Status: DC

## 2016-07-10 MED ORDER — IBUPROFEN 800 MG PO TABS: ORAL_TABLET | ORAL | 0 refills | Status: DC

## 2016-07-10 MED ORDER — KETOROLAC TROMETHAMINE 60 MG/2ML IM SOLN
INTRAMUSCULAR | Status: AC
  Filled 2016-07-10: qty 2

## 2016-07-10 NOTE — ED Provider Notes (Signed)
CSN: [id number]     Arrival date & time 07/10/16  1206 History   None    Chief Complaint  Patient presents with  . Dental Pain   (Consider location/radiation/quality/duration/timing/severity/associated sxs/prior Treatment) Patient c/o dental pain and tooth pain left lower molar.  She has a Pharmacist, community and will follow up tomorrow.   The history is provided by the patient.  Dental Pain  Location:  Lower Lower teeth location:  17/LL 3rd molar Quality:  Aching Severity:  Moderate Onset quality:  Sudden Duration:  2 days Timing:  Constant Chronicity:  New Context: abscess and dental caries   Relieved by:  Nothing   Past Medical History:  Diagnosis Date  . ADHD (attention deficit hyperactivity disorder)   . Bipolar 1 disorder ([place])   . History of alcohol abuse   . History of drug abuse   . Interstitial cystitis    Past Surgical History:  Procedure Laterality Date  . ADENOIDECTOMY  1971  . APPENDECTOMY    . SALPINGECTOMY  1993  . vaginal surgery     Family History  Problem Relation Age of Onset  . Arthritis Mother   . Hyperlipidemia Mother   . Heart disease Mother   . Stroke Mother   . Hypertension Mother   . Diabetes Mother   . Mental illness Mother   . Alcohol abuse Father   . Hypertension    . Diabetes    . Mental illness    . Cancer    . Cancer Sister 80    small intestine   . Alcoholism     Social History  Substance Use Topics  . Smoking status: Current Every Day Smoker    Packs/day: 0.50    Types: Cigarettes  . Smokeless tobacco: Never Used  . Alcohol use No   OB History    Gravida Para Term Preterm AB Living   2 1 1   1 1    SAB TAB Ectopic Multiple Live Births       1   1     Review of Systems  Constitutional: Negative.   HENT: Positive for dental problem.   Eyes: Negative.   Respiratory: Negative.   Cardiovascular: Negative.   Gastrointestinal: Negative.   Endocrine: Negative.   Genitourinary: Negative.   Musculoskeletal: Negative.    Allergic/Immunologic: Negative.   Neurological: Negative.   Hematological: Negative.   Psychiatric/Behavioral: Negative.     Allergies  Patient has no known allergies.  Home Medications   Prior to Admission medications   Medication Sig Start Date End Date Taking? Authorizing Provider  amoxicillin (AMOXIL) 875 MG tablet Take 1 tablet (875 mg total) by mouth 2 (two) times daily. 07/10/16   Lysbeth Penner, FNP  amphetamine-dextroamphetamine (ADDERALL XR) 20 MG 24 hr capsule Take 20 mg by mouth daily.    Historical Provider, MD  hydrochlorothiazide (HYDRODIURIL) 50 MG tablet Take 1 tablet (50 mg total) by mouth every morning. 01/01/16   Binnie Rail, MD  Ibuprofen (ADVIL) 200 MG CAPS Take by mouth as needed.    Historical Provider, MD  ibuprofen (ADVIL,MOTRIN) 800 MG tablet One po three times a day prn pain 07/10/16   Lysbeth Penner, FNP  loratadine (CLARITIN) 10 MG tablet Take 10 mg by mouth daily as needed for allergies.    Historical Provider, MD  meloxicam (MOBIC) 7.5 MG tablet Take 1 tablet (7.5 mg total) by mouth 2 (two) times daily as needed. For pain 01/01/16   Binnie Rail, MD  OLANZapine (ZYPREXA) 5 MG tablet Take 5 mg by mouth at bedtime.    Historical Provider, MD  Olopatadine HCl 0.7 % SOLN Apply 1 drop to eye daily. 01/01/16   Binnie Rail, MD  pentosan polysulfate (ELMIRON) 100 MG capsule Take 2 capsules (200 mg total) by mouth 2 (two) times daily. 01/01/16   Binnie Rail, MD  rosuvastatin (CRESTOR) 10 MG tablet Take 1 tablet (10 mg total) by mouth daily. 01/01/16   Binnie Rail, MD  solifenacin (VESICARE) 5 MG tablet Take 1 tablet (5 mg total) by mouth at bedtime. 01/01/16   Binnie Rail, MD   Meds Ordered and Administered this Visit   Medications  ketorolac (TORADOL) injection 60 mg (60 mg Intramuscular Given 07/10/16 1309)    BP (!) 132/93 (BP Location: Left Arm)   Pulse (!) 101   Temp 99.3 F (37.4 C) (Oral)   Resp 18   SpO2 99%  No data  found.   Physical Exam  Constitutional: She appears well-developed and well-nourished.  HENT:  Head: Normocephalic and atraumatic.  Left lower third molar tender.  Left jaw swollen and tender.  Eyes: Conjunctivae and EOM are normal. Pupils are equal, round, and reactive to light.  Neck: Normal range of motion. Neck supple.  Cardiovascular: Normal rate, regular rhythm and normal heart sounds.   Pulmonary/Chest: Effort normal and breath sounds normal.  Musculoskeletal: Normal range of motion.  Nursing note and vitals reviewed.   Urgent Care Course     Procedures (including critical care time)  Labs Review Labs Reviewed - No data to display  Imaging Review No results found.   Visual Acuity Review  Right Eye Distance:   Left Eye Distance:   Bilateral Distance:    Right Eye Near:   Left Eye Near:    Bilateral Near:         MDM   1. Dental infection   2. Tooth pain    Amoxicillin 875mg  one po bid x 10 days #20 Motrin 800mg  one po tid prn pain     Lysbeth Penner, FNP 07/10/16 1317

## 2016-07-10 NOTE — ED Triage Notes (Signed)
The patient presented to the [facility] with a complaint of dental pain x 3 days.

## 2016-07-21 ENCOUNTER — Encounter: Payer: Self-pay | Admitting: Internal Medicine

## 2016-07-21 NOTE — Progress Notes (Signed)
Subjective:    Patient ID: Kylie Wright, female    DOB: 1966/02/17, 51 y.o.   MRN: [id number]  HPI The patient is here for follow up.  Bipolar, anxiety, ADD:  She is following with psychiatry.  She feels her medication is at good doses.   IC:  She is taking hctz, vesicare and elmiron. She has seen urology since she was here and will follow up yearly.  She feels it is controlled, but has occasional flares.   Hip pain, lower back pain, feet pain: She takes meloxicam daily.  The medication does help and she needs a refill.    Hyperlipidemia: She is taking her medication daily. She is compliant with a low fat/cholesterol diet. She is not exercising regularly.    She is having a dental infection and will have a tooth removed today.   Medications and allergies reviewed with patient and updated if appropriate.  Patient Active Problem List   Diagnosis Date Noted  . ADD (attention deficit disorder) 01/01/2016  . Syncope 01/01/2016  . History of alcohol abuse 01/01/2016  . History of drug abuse 01/01/2016  . Bipolar disorder ([place]) 01/01/2016  . Hyperlipidemia 01/01/2016  . PTSD (post-traumatic stress disorder) 01/01/2016  . Osteoarthritis of both feet 01/01/2016  . Bilateral hip pain 01/01/2016  . IC (interstitial cystitis) 07/25/2012    Current Outpatient Prescriptions on File Prior to Visit  Medication Sig Dispense Refill  . hydrochlorothiazide (HYDRODIURIL) 50 MG tablet Take 1 tablet (50 mg total) by mouth every morning. 30 tablet 5  . ibuprofen (ADVIL,MOTRIN) 800 MG tablet One po three times a day prn pain 21 tablet 0  . loratadine (CLARITIN) 10 MG tablet Take 10 mg by mouth daily as needed for allergies.    . meloxicam (MOBIC) 7.5 MG tablet Take 1 tablet (7.5 mg total) by mouth 2 (two) times daily as needed. For pain 60 tablet 5  . OLANZapine (ZYPREXA) 5 MG tablet Take 5 mg by mouth at bedtime.    . Olopatadine HCl 0.7 % SOLN Apply 1 drop to eye daily. 2.5 mL 5  . pentosan  polysulfate (ELMIRON) 100 MG capsule Take 2 capsules (200 mg total) by mouth 2 (two) times daily. 120 capsule 5  . rosuvastatin (CRESTOR) 10 MG tablet Take 1 tablet (10 mg total) by mouth daily. 30 tablet 5  . solifenacin (VESICARE) 5 MG tablet Take 1 tablet (5 mg total) by mouth at bedtime. 30 tablet 5   No current facility-administered medications on file prior to visit.     Past Medical History:  Diagnosis Date  . ADHD (attention deficit hyperactivity disorder)   . Bipolar 1 disorder ([place])   . History of alcohol abuse   . History of drug abuse   . Interstitial cystitis     Past Surgical History:  Procedure Laterality Date  . ADENOIDECTOMY  1971  . APPENDECTOMY    . SALPINGECTOMY  1993  . vaginal surgery      Social History   Social History  . Marital status: Single    Spouse name: N/A  . Number of children: 1  . Years of education: N/A   Occupational History  .  [employer]   Social History Main Topics  . Smoking status: Current Every Day Smoker    Packs/day: 0.50    Types: Cigarettes  . Smokeless tobacco: Never Used  . Alcohol use No  . Drug use: No     Comment: h/o abuse hasn't used  since 2002  . Sexual activity: Not Currently    Partners: Male    Birth control/ protection: None   Other Topics Concern  . Not on file   Social History Narrative   Exercise:  Walks a lot, tries to stretch     Family History  Problem Relation Age of Onset  . Arthritis Mother   . Hyperlipidemia Mother   . Heart disease Mother   . Stroke Mother   . Hypertension Mother   . Diabetes Mother   . Mental illness Mother   . Alcohol abuse Father   . Hypertension    . Diabetes    . Mental illness    . Cancer    . Cancer Sister 52    small intestine   . Alcoholism      Review of Systems  Constitutional: Negative for chills and fever.  Respiratory: Negative for cough, shortness of breath and wheezing.   Cardiovascular: Negative for chest pain, palpitations and  leg swelling.  Neurological: Positive for light-headedness (occ). Negative for headaches.       Objective:   Vitals:   07/22/16 0851  BP: 134/86  Pulse: 92  Resp: 16  Temp: 98 F (36.7 C)   Wt Readings from Last 3 Encounters:  07/22/16 190 lb (86.2 kg)  01/01/16 195 lb (88.5 kg)  10/05/15 196 lb (88.9 kg)   Body mass index is 33.66 kg/m.   Physical Exam    Constitutional: Appears well-developed and well-nourished. No distress.  HENT:  Head: Normocephalic and atraumatic.  Neck: Neck supple. No tracheal deviation present. No thyromegaly present.  No cervical lymphadenopathy Cardiovascular: Normal rate, regular rhythm and normal heart sounds.   No murmur heard. No carotid bruit .  No edema Pulmonary/Chest: Effort normal and breath sounds normal. No respiratory distress. No has no wheezes. No rales.  Skin: Skin is warm and dry. Not diaphoretic.  Psychiatric: Normal mood and affect. Behavior is normal.      Assessment & Plan:   Advised smoking cessation  She looked into cologuard and it is not covered by her insurance.  She does not want to have a colonoscopy.   tdap today   See Problem List for Assessment and Plan of chronic medical problems.   FU annually

## 2016-07-22 ENCOUNTER — Ambulatory Visit (INDEPENDENT_AMBULATORY_CARE_PROVIDER_SITE_OTHER): Payer: BLUE CROSS/BLUE SHIELD | Admitting: Internal Medicine

## 2016-07-22 ENCOUNTER — Encounter: Payer: Self-pay | Admitting: Internal Medicine

## 2016-07-22 VITALS — BP 134/86 | HR 92 | Temp 98.0°F | Resp 16 | Wt 190.0 lb

## 2016-07-22 DIAGNOSIS — F319 Bipolar disorder, unspecified: Secondary | ICD-10-CM | POA: Diagnosis not present

## 2016-07-22 DIAGNOSIS — M25552 Pain in left hip: Secondary | ICD-10-CM

## 2016-07-22 DIAGNOSIS — M25551 Pain in right hip: Secondary | ICD-10-CM | POA: Diagnosis not present

## 2016-07-22 DIAGNOSIS — Z23 Encounter for immunization: Secondary | ICD-10-CM | POA: Diagnosis not present

## 2016-07-22 DIAGNOSIS — E785 Hyperlipidemia, unspecified: Secondary | ICD-10-CM

## 2016-07-22 DIAGNOSIS — N3010 Interstitial cystitis (chronic) without hematuria: Secondary | ICD-10-CM | POA: Diagnosis not present

## 2016-07-22 MED ORDER — MELOXICAM 7.5 MG PO TABS: 7.5000 mg | ORAL_TABLET | Freq: Two times a day (BID) | ORAL | 5 refills | Status: DC | PRN

## 2016-07-22 MED ORDER — ROSUVASTATIN CALCIUM 10 MG PO TABS: 10.0000 mg | ORAL_TABLET | Freq: Every day | ORAL | 5 refills | Status: DC

## 2016-07-22 NOTE — Addendum Note (Signed)
Addended by: Terence Lux B on: 07/22/2016 11:38 AM   Modules accepted: Orders

## 2016-07-22 NOTE — Assessment & Plan Note (Signed)
Check lipid panel  Continue daily statin Regular exercise and healthy diet encouraged  

## 2016-07-22 NOTE — Assessment & Plan Note (Signed)
Continue meloxicam daily

## 2016-07-22 NOTE — Patient Instructions (Addendum)
Try to start exercising regularly.  Work on weight loss.  Test(s) ordered today. Your results will be released to [facility] (or called to you) after review, usually within 72hours after test completion. If any changes need to be made, you will be notified at that same time.  All other Health Maintenance issues reviewed.   All recommended immunizations and age-appropriate screenings are up-to-date or discussed.  tdap immunization administered today.   Medications reviewed and updated.  No changes recommended at this time.  Your prescription(s) have been submitted to your pharmacy. Please take as directed and contact our office if you believe you are having problem(s) with the medication(s).   Please followup in one year for a physical

## 2016-07-22 NOTE — Assessment & Plan Note (Signed)
Following with psychiatry Stable per patient

## 2016-07-22 NOTE — Assessment & Plan Note (Signed)
Stable, occasional flares Sees urology annually

## 2016-07-22 NOTE — Progress Notes (Signed)
Pre visit review using our clinic review tool, if applicable. No additional management support is needed unless otherwise documented below in the visit note. 

## 2016-07-29 ENCOUNTER — Telehealth: Payer: Self-pay | Admitting: Internal Medicine

## 2016-07-29 ENCOUNTER — Encounter: Payer: Self-pay | Admitting: Internal Medicine

## 2016-07-29 ENCOUNTER — Other Ambulatory Visit (INDEPENDENT_AMBULATORY_CARE_PROVIDER_SITE_OTHER): Payer: BLUE CROSS/BLUE SHIELD

## 2016-07-29 DIAGNOSIS — E785 Hyperlipidemia, unspecified: Secondary | ICD-10-CM

## 2016-07-29 DIAGNOSIS — N3010 Interstitial cystitis (chronic) without hematuria: Secondary | ICD-10-CM | POA: Diagnosis not present

## 2016-07-29 DIAGNOSIS — F319 Bipolar disorder, unspecified: Secondary | ICD-10-CM

## 2016-07-29 DIAGNOSIS — R7303 Prediabetes: Secondary | ICD-10-CM | POA: Insufficient documentation

## 2016-07-29 LAB — LIPID PANEL
Cholesterol: 144 mg/dL (ref 0–200)
HDL: 40.7 mg/dL (ref >39.00)
LDL Cholesterol: 72 mg/dL (ref 0–99)
NonHDL: 103.51
Total CHOL/HDL Ratio: 4
Triglycerides: 157 mg/dL — ABNORMAL HIGH (ref 0.0–149.0)
VLDL: 31.4 mg/dL (ref 0.0–40.0)

## 2016-07-29 LAB — CBC WITH DIFFERENTIAL/PLATELET
Basophils Absolute: 0 10*3/uL (ref 0.0–0.1)
Basophils Relative: 0.2 % (ref 0.0–3.0)
Eosinophils Absolute: 0.1 10*3/uL (ref 0.0–0.7)
Eosinophils Relative: 0.8 % (ref 0.0–5.0)
HCT: 37.9 % (ref 36.0–46.0)
Hemoglobin: 12.9 g/dL (ref 12.0–15.0)
Lymphocytes Relative: 42.3 % (ref 12.0–46.0)
Lymphs Abs: 5.6 10*3/uL — ABNORMAL HIGH (ref 0.7–4.0)
MCHC: 34.1 g/dL (ref 30.0–36.0)
MCV: 91.3 fl (ref 78.0–100.0)
Monocytes Absolute: 0.8 10*3/uL (ref 0.1–1.0)
Monocytes Relative: 6.1 % (ref 3.0–12.0)
Neutro Abs: 6.7 10*3/uL (ref 1.4–7.7)
Neutrophils Relative %: 50.6 % (ref 43.0–77.0)
Platelets: 250 10*3/uL (ref 150.0–400.0)
RBC: 4.15 Mil/uL (ref 3.87–5.11)
RDW: 13.7 % (ref 11.5–15.5)
WBC: 13.3 10*3/uL — ABNORMAL HIGH (ref 4.0–10.5)

## 2016-07-29 LAB — COMPREHENSIVE METABOLIC PANEL
ALT: 25 U/L (ref 0–35)
AST: 26 U/L (ref 0–37)
Albumin: 4.3 g/dL (ref 3.5–5.2)
Alkaline Phosphatase: 94 U/L (ref 39–117)
BUN: 14 mg/dL (ref 6–23)
CO2: 29 mEq/L (ref 19–32)
Calcium: 9.5 mg/dL (ref 8.4–10.5)
Chloride: 101 mEq/L (ref 96–112)
Creatinine, Ser: 0.86 mg/dL (ref 0.40–1.20)
GFR: 73.74 mL/min (ref >60.00)
Glucose, Bld: 110 mg/dL — ABNORMAL HIGH (ref 70–99)
Potassium: 3.6 mEq/L (ref 3.5–5.1)
Sodium: 136 mEq/L (ref 135–145)
Total Bilirubin: 0.6 mg/dL (ref 0.2–1.2)
Total Protein: 8 g/dL (ref 6.0–8.3)

## 2016-07-29 LAB — TSH: TSH: 1.64 u[IU]/mL (ref 0.35–4.50)

## 2016-07-29 LAB — HEMOGLOBIN A1C: Hgb A1c MFr Bld: 5.9 % (ref 4.6–6.5)

## 2016-07-29 NOTE — Telephone Encounter (Signed)
Labs faxed to [facility]

## 2016-07-29 NOTE — Telephone Encounter (Signed)
Pt was here to have blood work done for Dr Quay Burow and also Eino Farber, [place] PA-C ([facility] and [facility], [place] [address] CJARW:[phone] EIH:[phone]). She said that they ordered the same thing so they only did Dr Quay Burow labs and she would like for those results to be sent to Eino Farber. The pt signed a release form requesting lab results to be sent and also any other information needed in the future.  Pt saw an oral surgeon and she prescribed Acetaminophen-Cod #3 to be used after surgery. They extracted her bottom left molar and the oral surgeon said the abscess was abnormal. She is going this Tuesday to find out the results.

## 2017-03-24 ENCOUNTER — Other Ambulatory Visit: Payer: Self-pay | Admitting: Internal Medicine

## 2017-03-24 DIAGNOSIS — Z139 Encounter for screening, unspecified: Secondary | ICD-10-CM

## 2017-04-14 ENCOUNTER — Ambulatory Visit
Admission: RE | Admit: 2017-04-14 | Discharge: 2017-04-14 | Disposition: A | Discharge status: Home / Self Care | Payer: BLUE CROSS/BLUE SHIELD | Source: Ambulatory Visit | Attending: Internal Medicine | Admitting: Internal Medicine

## 2017-04-14 DIAGNOSIS — Z139 Encounter for screening, unspecified: Secondary | ICD-10-CM

## 2017-04-29 ENCOUNTER — Other Ambulatory Visit: Payer: Self-pay | Admitting: Internal Medicine

## 2017-05-01 ENCOUNTER — Encounter: Payer: Self-pay | Admitting: Obstetrics

## 2017-05-01 ENCOUNTER — Ambulatory Visit (INDEPENDENT_AMBULATORY_CARE_PROVIDER_SITE_OTHER): Payer: BLUE CROSS/BLUE SHIELD | Admitting: Obstetrics

## 2017-05-01 VITALS — BP 179/110 | HR 78 | Temp 98.2°F | Resp 18 | Wt 197.6 lb

## 2017-05-01 DIAGNOSIS — Z113 Encounter for screening for infections with a predominantly sexual mode of transmission: Secondary | ICD-10-CM

## 2017-05-01 DIAGNOSIS — E669 Obesity, unspecified: Secondary | ICD-10-CM

## 2017-05-01 DIAGNOSIS — Z124 Encounter for screening for malignant neoplasm of cervix: Secondary | ICD-10-CM

## 2017-05-01 DIAGNOSIS — Z1151 Encounter for screening for human papillomavirus (HPV): Secondary | ICD-10-CM

## 2017-05-01 DIAGNOSIS — Z01419 Encounter for gynecological examination (general) (routine) without abnormal findings: Secondary | ICD-10-CM | POA: Diagnosis not present

## 2017-05-01 DIAGNOSIS — Z780 Asymptomatic menopausal state: Secondary | ICD-10-CM

## 2017-05-01 NOTE — Progress Notes (Signed)
Subjective:        Kylie Wright is a 52 y.o. female here for a routine exam.  Current complaints: None.    Personal health questionnaire:  Is patient Kylie Wright, have a family history of breast and/or ovarian cancer: no Is there a family history of uterine cancer diagnosed at age < 37, gastrointestinal cancer, urinary tract cancer, family member who is a Field seismologist syndrome-associated carrier: no Is the patient overweight and hypertensive, family history of diabetes, personal history of gestational diabetes, preeclampsia or PCOS: no Is patient over 99, have PCOS,  family history of premature CHD under age 60, diabetes, smoke, have hypertension or peripheral artery disease:  no At any time, has a partner hit, kicked or otherwise hurt or frightened you?: no Over the past 2 weeks, have you felt down, depressed or hopeless?: no Over the past 2 weeks, have you felt little interest or pleasure in doing things?:no   Gynecologic History No LMP recorded. Patient is not currently having periods (Reason: Perimenopausal). Contraception: post menopausal status Last Pap: 2017. Results were: normal Last mammogram: 2019. Results were: normal  Obstetric History OB History  Gravida Para Term Preterm AB Living  2 1 1   1 1   SAB TAB Ectopic Multiple Live Births      1   1    # Outcome Date GA Lbr Len/2nd Weight Sex Delivery Anes PTL Lv  2 Term 2003 [redacted]w[redacted]d   M Vag-Spont EPI N LIV  1 Ectopic 1993              Past Medical History:  Diagnosis Date  . ADHD (attention deficit hyperactivity disorder)   . Bipolar 1 disorder ([place])   . History of alcohol abuse   . History of drug abuse   . Interstitial cystitis     Past Surgical History:  Procedure Laterality Date  . ADENOIDECTOMY  1971  . APPENDECTOMY    . SALPINGECTOMY  1993  . vaginal surgery       Current Outpatient Medications:  .  hydrochlorothiazide (HYDRODIURIL) 50 MG tablet, Take 1 tablet (50 mg total) by mouth every morning.,  Disp: 30 tablet, Rfl: 5 .  loratadine (CLARITIN) 10 MG tablet, Take 10 mg by mouth daily as needed for allergies., Disp: , Rfl:  .  Melatonin 10 MG TABS, Take by mouth., Disp: , Rfl:  .  meloxicam (MOBIC) 7.5 MG tablet, TAKE 1 TABLET (7.5 MG TOTAL) BY MOUTH 2 (TWO) TIMES DAILY AS NEEDED. FOR PAIN, Disp: 60 tablet, Rfl: 5 .  OLANZapine (ZYPREXA) 5 MG tablet, Take 5 mg by mouth at bedtime., Disp: , Rfl:  .  Olopatadine HCl 0.7 % SOLN, Apply 1 drop to eye daily., Disp: 2.5 mL, Rfl: 5 .  pentosan polysulfate (ELMIRON) 100 MG capsule, Take 2 capsules (200 mg total) by mouth 2 (two) times daily., Disp: 120 capsule, Rfl: 5 .  rosuvastatin (CRESTOR) 10 MG tablet, Take 1 tablet (10 mg total) by mouth daily., Disp: 30 tablet, Rfl: 5 .  solifenacin (VESICARE) 5 MG tablet, Take 1 tablet (5 mg total) by mouth at bedtime. (Patient taking differently: Take 10 mg by mouth at bedtime. ), Disp: 30 tablet, Rfl: 5 .  amphetamine-dextroamphetamine (ADDERALL XR) 20 MG 24 hr capsule, Take 20 mg by mouth every morning., Disp: , Rfl: 0 No Known Allergies  Social History   Tobacco Use  . Smoking status: Current Every Day Smoker    Packs/day: 0.50    Types: Cigarettes  .  Smokeless tobacco: Never Used  Substance Use Topics  . Alcohol use: No    Alcohol/week: 0.0 oz    Family History  Problem Relation Age of Onset  . Arthritis Mother   . Hyperlipidemia Mother   . Heart disease Mother   . Stroke Mother   . Hypertension Mother   . Diabetes Mother   . Mental illness Mother   . Alcohol abuse Father   . Hypertension Unknown   . Diabetes Unknown   . Mental illness Unknown   . Cancer Unknown   . Cancer Sister 77       small intestine   . Alcoholism Unknown       Review of Systems  Constitutional: negative for fatigue and weight loss Respiratory: negative for cough and wheezing Cardiovascular: negative for chest pain, fatigue and palpitations Gastrointestinal: negative for abdominal pain and change in  bowel habits Musculoskeletal:negative for myalgias Neurological: negative for gait problems and tremors Behavioral/Psych: negative for abusive relationship, depression Endocrine: negative for temperature intolerance    Genitourinary:negative for abnormal menstrual periods, genital lesions, hot flashes, sexual problems and vaginal discharge Integument/breast: negative for breast lump, breast tenderness, nipple discharge and skin lesion(s)    Objective:       BP (!) 179/110 (BP Location: Right Arm, Patient Position: Sitting, Cuff Size: Large)   Pulse 78   Temp 98.2 F (36.8 C) (Oral)   Resp 18   Wt 197 lb 9.6 oz (89.6 kg)   BMI 35.00 kg/m  General:   alert  Skin:   no rash or abnormalities  Lungs:   clear to auscultation bilaterally  Heart:   regular rate and rhythm, S1, S2 normal, no murmur, click, rub or gallop  Breasts:   normal without suspicious masses, skin or nipple changes or axillary nodes  Abdomen:  normal findings: no organomegaly, soft, non-tender and no hernia  Pelvis:  External genitalia: normal general appearance Urinary system: urethral meatus normal and bladder without fullness, nontender Vaginal: normal without tenderness, induration or masses Cervix: normal appearance Adnexa: normal bimanual exam Uterus: anteverted and non-tender, normal size   Lab Review Urine pregnancy test Labs reviewed yes Radiologic studies reviewed yes  50% of 20 min visit spent on counseling and coordination of care.   Assessment:   1. Encounter for annual routine gynecological examination Rx: - Cytology - PAP - Cervicovaginal ancillary only  2. Menopause - doing well  3. Obesity (BMI 35.0-39.9 without comorbidity) - program of caloric reduction, exercise and behavioral modification recommended    Plan:    Education reviewed: calcium supplements, depression evaluation, low fat, low cholesterol diet, safe sex/STD prevention, self breast exams, skin cancer screening and  weight bearing exercise. Follow up in: 1 year.   No orders of the defined types were placed in this encounter.  No orders of the defined types were placed in this encounter.   Shelly Bombard MD

## 2017-05-02 LAB — CYTOLOGY - PAP
Diagnosis: NEGATIVE
HPV: NOT DETECTED

## 2017-05-02 LAB — CERVICOVAGINAL ANCILLARY ONLY
Bacterial vaginitis: POSITIVE — AB
Candida vaginitis: NEGATIVE
Chlamydia: NEGATIVE
Neisseria Gonorrhea: NEGATIVE
Trichomonas: NEGATIVE

## 2017-05-03 ENCOUNTER — Other Ambulatory Visit: Payer: Self-pay | Admitting: Obstetrics

## 2017-05-03 ENCOUNTER — Ambulatory Visit (HOSPITAL_COMMUNITY)
Admission: EM | Admit: 2017-05-03 | Discharge: 2017-05-03 | Disposition: A | Discharge status: Home / Self Care | Payer: BLUE CROSS/BLUE SHIELD | Attending: Family Medicine | Admitting: Family Medicine

## 2017-05-03 ENCOUNTER — Encounter (HOSPITAL_COMMUNITY): Payer: Self-pay | Admitting: Family Medicine

## 2017-05-03 ENCOUNTER — Other Ambulatory Visit: Payer: Self-pay

## 2017-05-03 DIAGNOSIS — M542 Cervicalgia: Secondary | ICD-10-CM | POA: Diagnosis not present

## 2017-05-03 DIAGNOSIS — B9689 Other specified bacterial agents as the cause of diseases classified elsewhere: Secondary | ICD-10-CM

## 2017-05-03 DIAGNOSIS — M546 Pain in thoracic spine: Secondary | ICD-10-CM | POA: Diagnosis not present

## 2017-05-03 DIAGNOSIS — S161XXA Strain of muscle, fascia and tendon at neck level, initial encounter: Secondary | ICD-10-CM

## 2017-05-03 DIAGNOSIS — N760 Acute vaginitis: Secondary | ICD-10-CM

## 2017-05-03 DIAGNOSIS — S39012A Strain of muscle, fascia and tendon of lower back, initial encounter: Secondary | ICD-10-CM

## 2017-05-03 MED ORDER — METRONIDAZOLE 500 MG PO TABS: 500.0000 mg | ORAL_TABLET | Freq: Two times a day (BID) | ORAL | 2 refills | Status: DC

## 2017-05-03 MED ORDER — CYCLOBENZAPRINE HCL 10 MG PO TABS: 10.0000 mg | ORAL_TABLET | Freq: Three times a day (TID) | ORAL | 0 refills | Status: DC

## 2017-05-03 NOTE — Discharge Instructions (Signed)
Please follow up with your primary doctor if you are not steadily improving over the next week.
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
## 2017-05-10 ENCOUNTER — Other Ambulatory Visit: Payer: Self-pay

## 2017-05-10 DIAGNOSIS — B9689 Other specified bacterial agents as the cause of diseases classified elsewhere: Secondary | ICD-10-CM

## 2017-05-10 DIAGNOSIS — N760 Acute vaginitis: Secondary | ICD-10-CM

## 2017-05-10 MED ORDER — METRONIDAZOLE 500 MG PO TABS: 500.0000 mg | ORAL_TABLET | Freq: Two times a day (BID) | ORAL | 0 refills | Status: AC

## 2017-06-21 ENCOUNTER — Encounter: Payer: Self-pay | Admitting: Internal Medicine

## 2017-07-27 NOTE — Patient Instructions (Addendum)
  Test(s) ordered today. Your results will be released to MyChart (or called to you) after review, usually within 72hours after test completion. If any changes need to be made, you will be notified at that same time.   Medications reviewed and updated.  Changes include starting amlodipine 5 mg daily.   Your prescription(s) have been submitted to your pharmacy. Please take as directed and contact our office if you believe you are having problem(s) with the medication(s).   Please followup in 6 months   

## 2017-07-27 NOTE — Progress Notes (Signed)
Subjective:    Patient ID: Kylie Wright, female    DOB: 01-06-1966, 52 y.o.   MRN: [id number]  HPI The patient is here for follow up.  Hypertension:  Her BP has been elevated when she has it checked at her psychiatrists office or gyn.  She tries to watch her sodium intake.  She is exercising some.  She denies chest pain, palpitations, edema and headaches.    Prediabetes:  She is somewhat compliant with a low sugar/carbohydrate diet.  She is exercising regularly - walking at lunch, pokemon.  Hyperlipidemia: She is taking her medication daily. She is compliant with a low fat/cholesterol diet. She is exercising regularly - some walking. She denies myalgias.   Bilateral hip pain, arthritis, back pain:  She is taking mobic once-twice daily.  She is seeing a Restaurant manager, fast food.  It has helped.    Medications and allergies reviewed with patient and updated if appropriate.  Patient Active Problem List   Diagnosis Date Noted  . Hypertension 07/28/2017  . Prediabetes 07/29/2016  . ADD (attention deficit disorder) 01/01/2016  . Syncope 01/01/2016  . History of alcohol abuse 01/01/2016  . History of drug abuse 01/01/2016  . Bipolar disorder ([place]) 01/01/2016  . Hyperlipidemia 01/01/2016  . PTSD (post-traumatic stress disorder) 01/01/2016  . Osteoarthritis of both feet 01/01/2016  . Bilateral hip pain 01/01/2016  . IC (interstitial cystitis) 07/25/2012    Current Outpatient Medications on File Prior to Visit  Medication Sig Dispense Refill  . amphetamine-dextroamphetamine (ADDERALL XR) 20 MG 24 hr capsule Take 20 mg by mouth every morning.  0  . ELMIRON 100 MG capsule     . hydrochlorothiazide (HYDRODIURIL) 50 MG tablet Take 1 tablet (50 mg total) by mouth every morning. 30 tablet 5  . loratadine (CLARITIN) 10 MG tablet Take 10 mg by mouth daily as needed for allergies.    . Melatonin 10 MG TABS Take by mouth.    . meloxicam (MOBIC) 7.5 MG tablet TAKE 1 TABLET (7.5 MG TOTAL) BY MOUTH 2 (TWO)  TIMES DAILY AS NEEDED. FOR PAIN 60 tablet 5  . OLANZapine (ZYPREXA) 5 MG tablet Take 5 mg by mouth at bedtime.    . Olopatadine HCl 0.7 % SOLN Apply 1 drop to eye daily. 2.5 mL 5  . rosuvastatin (CRESTOR) 10 MG tablet Take 1 tablet (10 mg total) by mouth daily. 30 tablet 5  . VESICARE 10 MG tablet Take 10 mg by mouth daily.  3   No current facility-administered medications on file prior to visit.     Past Medical History:  Diagnosis Date  . ADHD (attention deficit hyperactivity disorder)   . Bipolar 1 disorder ([place])   . History of alcohol abuse   . History of drug abuse   . Interstitial cystitis     Past Surgical History:  Procedure Laterality Date  . ADENOIDECTOMY  1971  . APPENDECTOMY    . SALPINGECTOMY  1993  . vaginal surgery      Social History   Socioeconomic History  . Marital status: Single    Spouse name: Not on file  . Number of children: 1  . Years of education: Not on file  . Highest education level: Not on file  Occupational History    Employer: [employer]  Social Needs  . Financial resource strain: Not on file  . Food insecurity:    Worry: Not on file    Inability: Not on file  . Transportation needs:  Medical: Not on file    Non-medical: Not on file  Tobacco Use  . Smoking status: Current Every Day Smoker    Packs/day: 0.50    Types: Cigarettes  . Smokeless tobacco: Never Used  Substance and Sexual Activity  . Alcohol use: No    Alcohol/week: 0.0 oz  . Drug use: No    Comment: h/o abuse hasn't used since 2002  . Sexual activity: Not Currently    Partners: Male    Birth control/protection: None  Lifestyle  . Physical activity:    Days per week: Not on file    Minutes per session: Not on file  . Stress: Not on file  Relationships  . Social connections:    Talks on phone: Not on file    Gets together: Not on file    Attends religious service: Not on file    Active member of club or organization: Not on file    Attends  meetings of clubs or organizations: Not on file    Relationship status: Not on file  Other Topics Concern  . Not on file  Social History Narrative   Exercise:  Walks a lot, tries to stretch     Family History  Problem Relation Age of Onset  . Arthritis Mother   . Hyperlipidemia Mother   . Heart disease Mother   . Stroke Mother   . Hypertension Mother   . Diabetes Mother   . Mental illness Mother   . Alcohol abuse Father   . Hypertension Unknown   . Diabetes Unknown   . Mental illness Unknown   . Cancer Unknown   . Cancer Sister 91       small intestine   . Alcoholism Unknown     Review of Systems  Constitutional: Negative for chills and fever.  Respiratory: Negative for cough, shortness of breath and wheezing.   Cardiovascular: Negative for chest pain and palpitations.  Neurological: Negative for light-headedness and headaches.       Objective:   Vitals:   07/28/17 0859  BP: 138/82  Pulse: 85  Resp: 16  Temp: 98.3 F (36.8 C)  SpO2: 98%   BP Readings from Last 3 Encounters:  07/28/17 138/82  05/03/17 (!) 157/109  05/01/17 (!) 179/110   Wt Readings from Last 3 Encounters:  07/28/17 198 lb (89.8 kg)  05/01/17 197 lb 9.6 oz (89.6 kg)  07/22/16 190 lb (86.2 kg)   Body mass index is 35.07 kg/m.   Physical Exam    Constitutional: Appears well-developed and well-nourished. No distress.  HENT:  Head: Normocephalic and atraumatic.  Neck: Neck supple. No tracheal deviation present. No thyromegaly present.  No cervical lymphadenopathy Cardiovascular: Normal rate, regular rhythm and normal heart sounds.   No murmur heard. No carotid bruit .  No edema Pulmonary/Chest: Effort normal and breath sounds normal. No respiratory distress. No has no wheezes. No rales.  Skin: Skin is warm and dry. Not diaphoretic.  Psychiatric: Normal mood and affect. Behavior is normal.      Assessment & Plan:    See Problem List for Assessment and Plan of chronic medical  problems.

## 2017-07-28 ENCOUNTER — Ambulatory Visit: Payer: BLUE CROSS/BLUE SHIELD | Admitting: Internal Medicine

## 2017-07-28 ENCOUNTER — Encounter: Payer: Self-pay | Admitting: Internal Medicine

## 2017-07-28 ENCOUNTER — Other Ambulatory Visit (INDEPENDENT_AMBULATORY_CARE_PROVIDER_SITE_OTHER): Payer: BLUE CROSS/BLUE SHIELD

## 2017-07-28 VITALS — BP 138/82 | HR 85 | Temp 98.3°F | Resp 16 | Wt 198.0 lb

## 2017-07-28 DIAGNOSIS — M25551 Pain in right hip: Secondary | ICD-10-CM

## 2017-07-28 DIAGNOSIS — R7303 Prediabetes: Secondary | ICD-10-CM

## 2017-07-28 DIAGNOSIS — M19071 Primary osteoarthritis, right ankle and foot: Secondary | ICD-10-CM | POA: Diagnosis not present

## 2017-07-28 DIAGNOSIS — M25552 Pain in left hip: Secondary | ICD-10-CM

## 2017-07-28 DIAGNOSIS — I10 Essential (primary) hypertension: Secondary | ICD-10-CM | POA: Insufficient documentation

## 2017-07-28 DIAGNOSIS — N3010 Interstitial cystitis (chronic) without hematuria: Secondary | ICD-10-CM

## 2017-07-28 DIAGNOSIS — M19072 Primary osteoarthritis, left ankle and foot: Secondary | ICD-10-CM

## 2017-07-28 DIAGNOSIS — E785 Hyperlipidemia, unspecified: Secondary | ICD-10-CM

## 2017-07-28 LAB — COMPREHENSIVE METABOLIC PANEL
ALT: 18 U/L (ref 0–35)
AST: 20 U/L (ref 0–37)
Albumin: 4.3 g/dL (ref 3.5–5.2)
Alkaline Phosphatase: 70 U/L (ref 39–117)
BUN: 13 mg/dL (ref 6–23)
CO2: 28 mEq/L (ref 19–32)
Calcium: 9.3 mg/dL (ref 8.4–10.5)
Chloride: 102 mEq/L (ref 96–112)
Creatinine, Ser: 0.84 mg/dL (ref 0.40–1.20)
GFR: 75.47 mL/min (ref >60.00)
Glucose, Bld: 108 mg/dL — ABNORMAL HIGH (ref 70–99)
Potassium: 3.4 mEq/L — ABNORMAL LOW (ref 3.5–5.1)
Sodium: 137 mEq/L (ref 135–145)
Total Bilirubin: 0.6 mg/dL (ref 0.2–1.2)
Total Protein: 7.6 g/dL (ref 6.0–8.3)

## 2017-07-28 LAB — LIPID PANEL
Cholesterol: 143 mg/dL (ref 0–200)
HDL: 47.3 mg/dL (ref >39.00)
LDL Cholesterol: 68 mg/dL (ref 0–99)
NonHDL: 96.17
Total CHOL/HDL Ratio: 3
Triglycerides: 141 mg/dL (ref 0.0–149.0)
VLDL: 28.2 mg/dL (ref 0.0–40.0)

## 2017-07-28 LAB — CBC WITH DIFFERENTIAL/PLATELET
Basophils Absolute: 0.1 10*3/uL (ref 0.0–0.1)
Basophils Relative: 0.7 % (ref 0.0–3.0)
Eosinophils Absolute: 0.1 10*3/uL (ref 0.0–0.7)
Eosinophils Relative: 0.6 % (ref 0.0–5.0)
HCT: 41 % (ref 36.0–46.0)
Hemoglobin: 14.1 g/dL (ref 12.0–15.0)
Lymphocytes Relative: 37.8 % (ref 12.0–46.0)
Lymphs Abs: 4.8 10*3/uL — ABNORMAL HIGH (ref 0.7–4.0)
MCHC: 34.5 g/dL (ref 30.0–36.0)
MCV: 93.1 fl (ref 78.0–100.0)
Monocytes Absolute: 0.7 10*3/uL (ref 0.1–1.0)
Monocytes Relative: 5.7 % (ref 3.0–12.0)
Neutro Abs: 7 10*3/uL (ref 1.4–7.7)
Neutrophils Relative %: 55.2 % (ref 43.0–77.0)
Platelets: 211 10*3/uL (ref 150.0–400.0)
RBC: 4.4 Mil/uL (ref 3.87–5.11)
RDW: 14.1 % (ref 11.5–15.5)
WBC: 12.7 10*3/uL — ABNORMAL HIGH (ref 4.0–10.5)

## 2017-07-28 LAB — HEMOGLOBIN A1C: Hgb A1c MFr Bld: 5.7 % (ref 4.6–6.5)

## 2017-07-28 MED ORDER — AMLODIPINE BESYLATE 5 MG PO TABS: 5.0000 mg | ORAL_TABLET | Freq: Every day | ORAL | 3 refills | Status: DC

## 2017-07-28 NOTE — Assessment & Plan Note (Addendum)
New, BP high on multiple occasions Discussed consequences of uncontrolled BP She is on hctz for her IC Start amlodipine 5 mg daily Cmp, tsh,cbc

## 2017-07-28 NOTE — Assessment & Plan Note (Signed)
Following with urology   management per urology

## 2017-07-28 NOTE — Assessment & Plan Note (Addendum)
Taking mobic daily  For OA Helping Will continue- discussed possible side effects - keep medication to a minimum cmp

## 2017-07-28 NOTE — Assessment & Plan Note (Signed)
Check a1c Low sugar / carb diet Stressed regular exercise, weight loss  

## 2017-07-28 NOTE — Assessment & Plan Note (Signed)
Taking mobic daily for OA of feet, hips, back Discussed keeping medication to a minimum due to possible side effects Ok to continue

## 2017-07-28 NOTE — Assessment & Plan Note (Signed)
Check lipid panel  Continue daily statin Regular exercise and healthy diet encouraged  

## 2017-09-25 ENCOUNTER — Other Ambulatory Visit: Payer: Self-pay | Admitting: Internal Medicine

## 2018-01-25 NOTE — Progress Notes (Signed)
Subjective:    Patient ID: Kylie Wright, female    DOB: 1966/03/17, 52 y.o.   MRN: [id number]  HPI The patient is here for follow up.  Obesity:  She is doing weight watchers and has a fit bit.  She does poke man go.  She has lost some weight and feels better.  Hypertension: She is taking her medication daily. She is compliant with a low sodium diet.  She denies chest pain, palpitations, edema, shortness of breath and regular headaches. She is exercising regularly.  She does not monitor her blood pressure at home.    Prediabetes:  She is compliant with a low sugar/carbohydrate diet.  She is doing weight watchers.  She is exercising regularly - walking.  Hyperlipidemia: She is taking her medication daily. She is compliant with a low fat/cholesterol diet. She is exercising regularly. She denies myalgias.   Osteoarthritis, feet, bilateral hips, back pain: She takes Mobic 1-2 times a day.  She is seeing a Restaurant manager, fast food as needed.  ADD, bipolar:  She is following with psychiatry.    Medications and allergies reviewed with patient and updated if appropriate.  Patient Active Problem List   Diagnosis Date Noted  . Hypertension 07/28/2017  . Prediabetes 07/29/2016  . ADD (attention deficit disorder) 01/01/2016  . Syncope 01/01/2016  . History of alcohol abuse 01/01/2016  . History of drug abuse ([place]) 01/01/2016  . Bipolar disorder ([place]) 01/01/2016  . Hyperlipidemia 01/01/2016  . PTSD (post-traumatic stress disorder) 01/01/2016  . Osteoarthritis of both feet 01/01/2016  . Bilateral hip pain 01/01/2016  . IC (interstitial cystitis) 07/25/2012    Current Outpatient Medications on File Prior to Visit  Medication Sig Dispense Refill  . amLODipine (NORVASC) 5 MG tablet Take 1 tablet (5 mg total) by mouth daily. 90 tablet 3  . amphetamine-dextroamphetamine (ADDERALL XR) 20 MG 24 hr capsule Take 20 mg by mouth every morning.  0  . ELMIRON 100 MG capsule     . hydrochlorothiazide  (HYDRODIURIL) 50 MG tablet Take 1 tablet (50 mg total) by mouth every morning. 30 tablet 5  . loratadine (CLARITIN) 10 MG tablet Take 10 mg by mouth daily as needed for allergies.    . Melatonin 10 MG TABS Take by mouth.    . meloxicam (MOBIC) 7.5 MG tablet TAKE 1 TABLET (7.5 MG TOTAL) BY MOUTH 2 (TWO) TIMES DAILY AS NEEDED. FOR PAIN 60 tablet 5  . OLANZapine (ZYPREXA) 5 MG tablet Take 5 mg by mouth at bedtime.    . Olopatadine HCl 0.7 % SOLN Apply 1 drop to eye daily. 2.5 mL 5  . rosuvastatin (CRESTOR) 10 MG tablet TAKE 1 TABLET BY MOUTH EVERY DAY 30 tablet 5  . VESICARE 10 MG tablet Take 10 mg by mouth daily.  3   No current facility-administered medications on file prior to visit.     Past Medical History:  Diagnosis Date  . ADHD (attention deficit hyperactivity disorder)   . Bipolar 1 disorder ([place])   . History of alcohol abuse   . History of drug abuse ([place])   . Interstitial cystitis     Past Surgical History:  Procedure Laterality Date  . ADENOIDECTOMY  1971  . APPENDECTOMY    . SALPINGECTOMY  1993  . vaginal surgery      Social History   Socioeconomic History  . Marital status: Single    Spouse name: Not on file  . Number of children: 1  . Years of education: Not  on file  . Highest education level: Not on file  Occupational History    Employer: [employer]  Social Needs  . Financial resource strain: Not on file  . Food insecurity:    Worry: Not on file    Inability: Not on file  . Transportation needs:    Medical: Not on file    Non-medical: Not on file  Tobacco Use  . Smoking status: Current Every Day Smoker    Packs/day: 0.50    Types: Cigarettes  . Smokeless tobacco: Never Used  Substance and Sexual Activity  . Alcohol use: No    Alcohol/week: 0.0 standard drinks  . Drug use: No    Comment: h/o abuse hasn't used since 2002  . Sexual activity: Not Currently    Partners: Male    Birth control/protection: None  Lifestyle  . Physical  activity:    Days per week: Not on file    Minutes per session: Not on file  . Stress: Not on file  Relationships  . Social connections:    Talks on phone: Not on file    Gets together: Not on file    Attends religious service: Not on file    Active member of club or organization: Not on file    Attends meetings of clubs or organizations: Not on file    Relationship status: Not on file  Other Topics Concern  . Not on file  Social History Narrative   Exercise:  Walks a lot, tries to stretch     Family History  Problem Relation Age of Onset  . Arthritis Mother   . Hyperlipidemia Mother   . Heart disease Mother   . Stroke Mother   . Hypertension Mother   . Diabetes Mother   . Mental illness Mother   . Alcohol abuse Father   . Hypertension Unknown   . Diabetes Unknown   . Mental illness Unknown   . Cancer Unknown   . Cancer Sister 8       small intestine   . Alcoholism Unknown     Review of Systems  Constitutional: Negative for chills and fever.  Respiratory: Negative for cough, shortness of breath and wheezing.   Cardiovascular: Positive for chest pain (with anxiety). Negative for palpitations and leg swelling.  Neurological: Negative for light-headedness and headaches.  Psychiatric/Behavioral: Positive for dysphoric mood. The patient is nervous/anxious.        Objective:   Vitals:   01/26/18 0906  BP: (!) 158/84  Pulse: 75  Resp: 16  Temp: 98.4 F (36.9 C)  SpO2: 98%   BP Readings from Last 3 Encounters:  01/26/18 (!) 158/84  07/28/17 138/82  05/03/17 (!) 157/109   Wt Readings from Last 3 Encounters:  01/26/18 187 lb (84.8 kg)  07/28/17 198 lb (89.8 kg)  05/01/17 197 lb 9.6 oz (89.6 kg)   Body mass index is 33.13 kg/m.   Physical Exam    Constitutional: Appears well-developed and well-nourished. No distress.  HENT:  Head: Normocephalic and atraumatic.  Neck: Neck supple. No tracheal deviation present. No thyromegaly present.  No cervical  lymphadenopathy Cardiovascular: Normal rate, regular rhythm and normal heart sounds.   No murmur heard. No carotid bruit .  No edema Pulmonary/Chest: Effort normal and breath sounds normal. No respiratory distress. No has no wheezes. No rales.  Skin: Skin is warm and dry. Not diaphoretic.  Psychiatric: Normal mood and affect. Behavior is normal.      Assessment & Plan:  See Problem List for Assessment and Plan of chronic medical problems.

## 2018-01-25 NOTE — Patient Instructions (Addendum)
BP should be less than 140/90.   Tests ordered today. Your results will be released to [facility] (or called to you) after review, usually within 72hours after test completion. If any changes need to be made, you will be notified at that same time.  Flu immunization administered today.    Medications reviewed and updated.  Changes include :   New BP medication - bystolic 5 mg daily  Your prescription(s) have been submitted to your pharmacy. Please take as directed and contact our office if you believe you are having problem(s) with the medication(s).   Please followup in 6 months

## 2018-01-26 ENCOUNTER — Other Ambulatory Visit (INDEPENDENT_AMBULATORY_CARE_PROVIDER_SITE_OTHER): Payer: BLUE CROSS/BLUE SHIELD

## 2018-01-26 ENCOUNTER — Ambulatory Visit: Payer: BLUE CROSS/BLUE SHIELD | Admitting: Internal Medicine

## 2018-01-26 ENCOUNTER — Encounter: Payer: Self-pay | Admitting: Internal Medicine

## 2018-01-26 VITALS — BP 158/84 | HR 75 | Temp 98.4°F | Resp 16 | Ht 63.0 in | Wt 187.0 lb

## 2018-01-26 DIAGNOSIS — E785 Hyperlipidemia, unspecified: Secondary | ICD-10-CM

## 2018-01-26 DIAGNOSIS — M19071 Primary osteoarthritis, right ankle and foot: Secondary | ICD-10-CM

## 2018-01-26 DIAGNOSIS — I10 Essential (primary) hypertension: Secondary | ICD-10-CM

## 2018-01-26 DIAGNOSIS — R7303 Prediabetes: Secondary | ICD-10-CM

## 2018-01-26 DIAGNOSIS — Z23 Encounter for immunization: Secondary | ICD-10-CM | POA: Diagnosis not present

## 2018-01-26 DIAGNOSIS — M19072 Primary osteoarthritis, left ankle and foot: Secondary | ICD-10-CM

## 2018-01-26 DIAGNOSIS — N3010 Interstitial cystitis (chronic) without hematuria: Secondary | ICD-10-CM

## 2018-01-26 DIAGNOSIS — M25551 Pain in right hip: Secondary | ICD-10-CM

## 2018-01-26 DIAGNOSIS — M25552 Pain in left hip: Secondary | ICD-10-CM

## 2018-01-26 LAB — COMPREHENSIVE METABOLIC PANEL
ALT: 22 U/L (ref 0–35)
AST: 25 U/L (ref 0–37)
Albumin: 4.7 g/dL (ref 3.5–5.2)
Alkaline Phosphatase: 87 U/L (ref 39–117)
BUN: 10 mg/dL (ref 6–23)
CO2: 29 mEq/L (ref 19–32)
Calcium: 9.6 mg/dL (ref 8.4–10.5)
Chloride: 102 mEq/L (ref 96–112)
Creatinine, Ser: 0.86 mg/dL (ref 0.40–1.20)
GFR: 73.31 mL/min (ref >60.00)
Glucose, Bld: 107 mg/dL — ABNORMAL HIGH (ref 70–99)
Potassium: 3.4 mEq/L — ABNORMAL LOW (ref 3.5–5.1)
Sodium: 138 mEq/L (ref 135–145)
Total Bilirubin: 0.5 mg/dL (ref 0.2–1.2)
Total Protein: 8.4 g/dL — ABNORMAL HIGH (ref 6.0–8.3)

## 2018-01-26 LAB — LIPID PANEL
Cholesterol: 173 mg/dL (ref 0–200)
HDL: 56.8 mg/dL (ref >39.00)
LDL Cholesterol: 93 mg/dL (ref 0–99)
NonHDL: 116
Total CHOL/HDL Ratio: 3
Triglycerides: 113 mg/dL (ref 0.0–149.0)
VLDL: 22.6 mg/dL (ref 0.0–40.0)

## 2018-01-26 LAB — HEMOGLOBIN A1C: Hgb A1c MFr Bld: 5.5 % (ref 4.6–6.5)

## 2018-01-26 MED ORDER — OLOPATADINE HCL 0.7 % OP SOLN: 1.0000 [drp] | Freq: Every day | OPHTHALMIC | 5 refills | Status: DC

## 2018-01-26 MED ORDER — NEBIVOLOL HCL 5 MG PO TABS: 5.0000 mg | ORAL_TABLET | Freq: Every day | ORAL | 5 refills | Status: DC

## 2018-01-26 MED ORDER — ROSUVASTATIN CALCIUM 10 MG PO TABS: 10.0000 mg | ORAL_TABLET | Freq: Every day | ORAL | 1 refills | Status: DC

## 2018-01-26 NOTE — Assessment & Plan Note (Signed)
Check lipid panel  Continue daily statin Regular exercise and healthy diet encouraged  

## 2018-01-26 NOTE — Assessment & Plan Note (Signed)
Taking meloxicam 1-2 times a day, which is helpful Will continue CMP

## 2018-01-26 NOTE — Assessment & Plan Note (Signed)
Taking meloxicam 1-2 times a day, which is helpful Check CMP

## 2018-01-26 NOTE — Assessment & Plan Note (Addendum)
BP not ideally controlled Continue amlodipine 5 mg daily Continue hydrochlorothiazide 50 mg daily, which she takes for IC Start Bystolic 5 mg daily Advised her to monitor her blood pressure at home and the me know if it is not consistently less than 140/90 CMP

## 2018-01-26 NOTE — Assessment & Plan Note (Signed)
Taking hctz daily Following with urology

## 2018-01-26 NOTE — Assessment & Plan Note (Signed)
Check a1c Doing weight watchers and has lost weight Stressed low sugar/carb diet -- doing better

## 2018-01-29 ENCOUNTER — Telehealth: Payer: Self-pay | Admitting: Internal Medicine

## 2018-01-29 NOTE — Telephone Encounter (Signed)
Copied from [place] [id number]. Topic: General - Other >> Jan 29, 2018  9:32 AM Leward Quan A wrote: Reason for CRM: Patient called to say that she need the generic for nebivolol (BYSTOLIC) 5 MG tablet , and that eye drop prescribed on 01/26/18 is too expensive but per insurance Azelastine HCL Drops is covered at a lower cost.

## 2018-01-29 NOTE — Telephone Encounter (Signed)
There is no generic for Bystolic.Marland Kitchenwill hold for MD to change to address change on both meds.Marland KitchenJohny Chess

## 2018-01-30 NOTE — Telephone Encounter (Signed)
There is no generic for bystolic.  Is she will to take lisinopril?    She should call her insurance for an alternative to the eye drops - not sure what is cheaper.

## 2018-01-31 NOTE — Telephone Encounter (Signed)
Pt is going to do the bystolic for now. Was able to get a coupon to help make with cheaper.

## 2018-02-01 MED ORDER — AZELASTINE HCL 0.05 % OP SOLN: 1.0000 [drp] | Freq: Two times a day (BID) | OPHTHALMIC | 12 refills | Status: DC

## 2018-02-01 MED ORDER — LISINOPRIL 10 MG PO TABS: 10.0000 mg | ORAL_TABLET | Freq: Every day | ORAL | 5 refills | Status: DC

## 2018-02-01 NOTE — Telephone Encounter (Signed)
Pt does want an alternative after she is finished with this round of bystolic. Also an alternative eye drop was Azelastine HCL drops.

## 2018-08-03 ENCOUNTER — Encounter: Payer: BLUE CROSS/BLUE SHIELD | Admitting: Internal Medicine

## 2018-08-08 ENCOUNTER — Other Ambulatory Visit: Payer: Self-pay | Admitting: Internal Medicine

## 2018-08-16 ENCOUNTER — Other Ambulatory Visit: Payer: Self-pay | Admitting: Internal Medicine

## 2018-08-16 NOTE — Patient Instructions (Addendum)
Tests ordered today. Your results will be released to MyChart (or called to you) after review, usually within 72hours after test completion. If any changes need to be made, you will be notified at that same time.  All other Health Maintenance issues reviewed.   All recommended immunizations and age-appropriate screenings are up-to-date or discussed.  No immunizations administered today.   Medications reviewed and updated.  Changes include :   none  Your prescription(s) have been submitted to your pharmacy. Please take as directed and contact our office if you believe you are having problem(s) with the medication(s).   Please followup in 6 months   Health Maintenance, Female Adopting a healthy lifestyle and getting preventive care can go a long way to promote health and wellness. Talk with your health care provider about what schedule of regular examinations is right for you. This is a good chance for you to check in with your provider about disease prevention and staying healthy. In between checkups, there are plenty of things you can do on your own. Experts have done a lot of research about which lifestyle changes and preventive measures are most likely to keep you healthy. Ask your health care provider for more information. Weight and diet Eat a healthy diet  Be sure to include plenty of vegetables, fruits, low-fat dairy products, and lean protein.  Do not eat a lot of foods high in solid fats, added sugars, or salt.  Get regular exercise. This is one of the most important things you can do for your health. ? Most adults should exercise for at least 150 minutes each week. The exercise should increase your heart rate and make you sweat (moderate-intensity exercise). ? Most adults should also do strengthening exercises at least twice a week. This is in addition to the moderate-intensity exercise. Maintain a healthy weight  Body mass index (BMI) is a measurement that can be used to  identify possible weight problems. It estimates body fat based on height and weight. Your health care provider can help determine your BMI and help you achieve or maintain a healthy weight.  For females 20 years of age and older: ? A BMI below 18.5 is considered underweight. ? A BMI of 18.5 to 24.9 is normal. ? A BMI of 25 to 29.9 is considered overweight. ? A BMI of 30 and above is considered obese. Watch levels of cholesterol and blood lipids  You should start having your blood tested for lipids and cholesterol at 54 years of age, then have this test every 5 years.  You may need to have your cholesterol levels checked more often if: ? Your lipid or cholesterol levels are high. ? You are older than 54 years of age. ? You are at high risk for heart disease. Cancer screening Lung Cancer  Lung cancer screening is recommended for adults 55-80 years old who are at high risk for lung cancer because of a history of smoking.  A yearly low-dose CT scan of the lungs is recommended for people who: ? Currently smoke. ? Have quit within the past 15 years. ? Have at least a 30-pack-year history of smoking. A pack year is smoking an average of one pack of cigarettes a day for 1 year.  Yearly screening should continue until it has been 15 years since you quit.  Yearly screening should stop if you develop a health problem that would prevent you from having lung cancer treatment. Breast Cancer  Practice breast self-awareness. This means understanding how   your breasts normally appear and feel.  It also means doing regular breast self-exams. Let your health care provider know about any changes, no matter how small.  If you are in your 20s or 30s, you should have a clinical breast exam (CBE) by a health care provider every 1-3 years as part of a regular health exam.  If you are 40 or older, have a CBE every year. Also consider having a breast X-ray (mammogram) every year.  If you have a family  history of breast cancer, talk to your health care provider about genetic screening.  If you are at high risk for breast cancer, talk to your health care provider about having an MRI and a mammogram every year.  Breast cancer gene (BRCA) assessment is recommended for women who have family members with BRCA-related cancers. BRCA-related cancers include: ? Breast. ? Ovarian. ? Tubal. ? Peritoneal cancers.  Results of the assessment will determine the need for genetic counseling and BRCA1 and BRCA2 testing. Cervical Cancer Your health care provider may recommend that you be screened regularly for cancer of the pelvic organs (ovaries, uterus, and vagina). This screening involves a pelvic examination, including checking for microscopic changes to the surface of your cervix (Pap test). You may be encouraged to have this screening done every 3 years, beginning at age 21.  For women ages 30-65, health care providers may recommend pelvic exams and Pap testing every 3 years, or they may recommend the Pap and pelvic exam, combined with testing for human papilloma virus (HPV), every 5 years. Some types of HPV increase your risk of cervical cancer. Testing for HPV may also be done on women of any age with unclear Pap test results.  Other health care providers may not recommend any screening for nonpregnant women who are considered low risk for pelvic cancer and who do not have symptoms. Ask your health care provider if a screening pelvic exam is right for you.  If you have had past treatment for cervical cancer or a condition that could lead to cancer, you need Pap tests and screening for cancer for at least 20 years after your treatment. If Pap tests have been discontinued, your risk factors (such as having a new sexual partner) need to be reassessed to determine if screening should resume. Some women have medical problems that increase the chance of getting cervical cancer. In these cases, your health care  provider may recommend more frequent screening and Pap tests. Colorectal Cancer  This type of cancer can be detected and often prevented.  Routine colorectal cancer screening usually begins at 54 years of age and continues through 54 years of age.  Your health care provider may recommend screening at an earlier age if you have risk factors for colon cancer.  Your health care provider may also recommend using home test kits to check for hidden blood in the stool.  A small camera at the end of a tube can be used to examine your colon directly (sigmoidoscopy or colonoscopy). This is done to check for the earliest forms of colorectal cancer.  Routine screening usually begins at age 50.  Direct examination of the colon should be repeated every 5-10 years through 54 years of age. However, you may need to be screened more often if early forms of precancerous polyps or small growths are found. Skin Cancer  Check your skin from head to toe regularly.  Tell your health care provider about any new moles or changes in moles, especially   if there is a change in a mole's shape or color.  Also tell your health care provider if you have a mole that is larger than the size of a pencil eraser.  Always use sunscreen. Apply sunscreen liberally and repeatedly throughout the day.  Protect yourself by wearing long sleeves, pants, a wide-brimmed hat, and sunglasses whenever you are outside. Heart disease, diabetes, and high blood pressure  High blood pressure causes heart disease and increases the risk of stroke. High blood pressure is more likely to develop in: ? People who have blood pressure in the high end of the normal range (130-139/85-89 mm Hg). ? People who are overweight or obese. ? People who are African American.  If you are 18-39 years of age, have your blood pressure checked every 3-5 years. If you are 40 years of age or older, have your blood pressure checked every year. You should have your  blood pressure measured twice-once when you are at a hospital or clinic, and once when you are not at a hospital or clinic. Record the average of the two measurements. To check your blood pressure when you are not at a hospital or clinic, you can use: ? An automated blood pressure machine at a pharmacy. ? A home blood pressure monitor.  If you are between 55 years and 79 years old, ask your health care provider if you should take aspirin to prevent strokes.  Have regular diabetes screenings. This involves taking a blood sample to check your fasting blood sugar level. ? If you are at a normal weight and have a low risk for diabetes, have this test once every three years after 54 years of age. ? If you are overweight and have a high risk for diabetes, consider being tested at a younger age or more often. Preventing infection Hepatitis B  If you have a higher risk for hepatitis B, you should be screened for this virus. You are considered at high risk for hepatitis B if: ? You were born in a country where hepatitis B is common. Ask your health care provider which countries are considered high risk. ? Your parents were born in a high-risk country, and you have not been immunized against hepatitis B (hepatitis B vaccine). ? You have HIV or AIDS. ? You use needles to inject street drugs. ? You live with someone who has hepatitis B. ? You have had sex with someone who has hepatitis B. ? You get hemodialysis treatment. ? You take certain medicines for conditions, including cancer, organ transplantation, and autoimmune conditions. Hepatitis C  Blood testing is recommended for: ? Everyone born from 1945 through 1965. ? Anyone with known risk factors for hepatitis C. Sexually transmitted infections (STIs)  You should be screened for sexually transmitted infections (STIs) including gonorrhea and chlamydia if: ? You are sexually active and are younger than 54 years of age. ? You are older than 54  years of age and your health care provider tells you that you are at risk for this type of infection. ? Your sexual activity has changed since you were last screened and you are at an increased risk for chlamydia or gonorrhea. Ask your health care provider if you are at risk.  If you do not have HIV, but are at risk, it may be recommended that you take a prescription medicine daily to prevent HIV infection. This is called pre-exposure prophylaxis (PrEP). You are considered at risk if: ? You are sexually active and do not   regularly use condoms or know the HIV status of your partner(s). ? You take drugs by injection. ? You are sexually active with a partner who has HIV. Talk with your health care provider about whether you are at high risk of being infected with HIV. If you choose to begin PrEP, you should first be tested for HIV. You should then be tested every 3 months for as long as you are taking PrEP. Pregnancy  If you are premenopausal and you may become pregnant, ask your health care provider about preconception counseling.  If you may become pregnant, take 400 to 800 micrograms (mcg) of folic acid every day.  If you want to prevent pregnancy, talk to your health care provider about birth control (contraception). Osteoporosis and menopause  Osteoporosis is a disease in which the bones lose minerals and strength with aging. This can result in serious bone fractures. Your risk for osteoporosis can be identified using a bone density scan.  If you are 65 years of age or older, or if you are at risk for osteoporosis and fractures, ask your health care provider if you should be screened.  Ask your health care provider whether you should take a calcium or vitamin D supplement to lower your risk for osteoporosis.  Menopause may have certain physical symptoms and risks.  Hormone replacement therapy may reduce some of these symptoms and risks. Talk to your health care provider about whether  hormone replacement therapy is right for you. Follow these instructions at home:  Schedule regular health, dental, and eye exams.  Stay current with your immunizations.  Do not use any tobacco products including cigarettes, chewing tobacco, or electronic cigarettes.  If you are pregnant, do not drink alcohol.  If you are breastfeeding, limit how much and how often you drink alcohol.  Limit alcohol intake to no more than 1 drink per day for nonpregnant women. One drink equals 12 ounces of beer, 5 ounces of wine, or 1 ounces of hard liquor.  Do not use street drugs.  Do not share needles.  Ask your health care provider for help if you need support or information about quitting drugs.  Tell your health care provider if you often feel depressed.  Tell your health care provider if you have ever been abused or do not feel safe at home. This information is not intended to replace advice given to you by your health care provider. Make sure you discuss any questions you have with your health care provider. Document Released: 09/20/2010 Document Revised: 08/13/2015 Document Reviewed: 12/09/2014 Elsevier Interactive Patient Education  2019 Else[employer] Inc.  

## 2018-08-16 NOTE — Progress Notes (Signed)
Subjective:    Patient ID: Kylie Wright, female    DOB: 23-Jun-1965, 53 y.o.   MRN: [id number]  HPI She is here for a physical exam.   She checks her BP at home 126/80's.  It has been well controlled whenever she takes it.  She feels the Bystolic has been very helpful and effective for her blood pressure.  She is very happy about taking the medication.  She is still smoking on a daily basis.  She knows and wants to quit.  She did quit at one point for 3 years, but has not tried recently.  Medications and allergies reviewed with patient and updated if appropriate.  Patient Active Problem List   Diagnosis Date Noted  . Tobacco abuse 08/17/2018  . Hypertension 07/28/2017  . Prediabetes 07/29/2016  . ADD (attention deficit disorder) 01/01/2016  . Syncope 01/01/2016  . History of alcohol abuse 01/01/2016  . History of drug abuse ([place]) 01/01/2016  . Bipolar disorder ([place]) 01/01/2016  . Hyperlipidemia 01/01/2016  . PTSD (post-traumatic stress disorder) 01/01/2016  . Osteoarthritis of both feet 01/01/2016  . Bilateral hip pain 01/01/2016  . IC (interstitial cystitis) 07/25/2012    Current Outpatient Medications on File Prior to Visit  Medication Sig Dispense Refill  . amphetamine-dextroamphetamine (ADDERALL XR) 20 MG 24 hr capsule Take 20 mg by mouth every morning.  0  . azelastine (OPTIVAR) 0.05 % ophthalmic solution Place 1 drop into both eyes 2 (two) times daily. 6 mL 12  . ELMIRON 100 MG capsule     . hydrochlorothiazide (HYDRODIURIL) 50 MG tablet Take 1 tablet (50 mg total) by mouth every morning. 30 tablet 5  . loratadine (CLARITIN) 10 MG tablet Take 10 mg by mouth daily as needed for allergies.    . Melatonin 10 MG TABS Take by mouth.    . meloxicam (MOBIC) 7.5 MG tablet TAKE 1 TABLET (7.5 MG TOTAL) BY MOUTH 2 (TWO) TIMES DAILY AS NEEDED. FOR PAIN 60 tablet 5  . VESICARE 10 MG tablet Take 10 mg by mouth daily.  3  . OLANZapine (ZYPREXA) 5 MG tablet Take 5 mg by mouth at  bedtime.     No current facility-administered medications on file prior to visit.     Past Medical History:  Diagnosis Date  . ADHD (attention deficit hyperactivity disorder)   . Bipolar 1 disorder ([place])   . History of alcohol abuse   . History of drug abuse ([employer])   . Interstitial cystitis     Past Surgical History:  Procedure Laterality Date  . ADENOIDECTOMY  1971  . APPENDECTOMY    . SALPINGECTOMY  1993  . vaginal surgery      Social History   Socioeconomic History  . Marital status: Single    Spouse name: Not on file  . Number of children: 1  . Years of education: Not on file  . Highest education level: Not on file  Occupational History    Employer: [employer]  Social Needs  . Financial resource strain: Not on file  . Food insecurity:    Worry: Not on file    Inability: Not on file  . Transportation needs:    Medical: Not on file    Non-medical: Not on file  Tobacco Use  . Smoking status: Current Every Day Smoker    Packs/day: 0.50    Types: Cigarettes  . Smokeless tobacco: Never Used  Substance and Sexual Activity  . Alcohol use: No  Alcohol/week: 0.0 standard drinks  . Drug use: No    Comment: h/o abuse hasn't used since 2002  . Sexual activity: Not Currently    Partners: Male    Birth control/protection: None  Lifestyle  . Physical activity:    Days per week: Not on file    Minutes per session: Not on file  . Stress: Not on file  Relationships  . Social connections:    Talks on phone: Not on file    Gets together: Not on file    Attends religious service: Not on file    Active member of club or organization: Not on file    Attends meetings of clubs or organizations: Not on file    Relationship status: Not on file  Other Topics Concern  . Not on file  Social History Narrative   Exercise:  Walks a lot, tries to stretch     Family History  Problem Relation Age of Onset  . Arthritis Mother   . Hyperlipidemia Mother   . Heart  disease Mother   . Stroke Mother   . Hypertension Mother   . Diabetes Mother   . Mental illness Mother   . Alcohol abuse Father   . Hypertension Other   . Diabetes Other   . Mental illness Other   . Cancer Other   . Cancer Sister 38       small intestine   . Alcoholism Other     Review of Systems  Constitutional: Negative for chills and fever.  Eyes: Negative for visual disturbance.  Respiratory: Negative for cough, shortness of breath and wheezing.   Cardiovascular: Positive for leg swelling (chronic, mild). Negative for chest pain and palpitations.  Gastrointestinal: Negative for blood in stool, constipation, diarrhea and nausea.       No gerd  Genitourinary: Positive for pelvic pain (pelvic pain intermittent). Negative for dysuria and hematuria.  Musculoskeletal: Positive for arthralgias and back pain.  Skin: Negative for color change and rash.  Neurological: Positive for dizziness (occ). Negative for light-headedness and headaches.  Psychiatric/Behavioral: Positive for dysphoric mood. The patient is nervous/anxious.        Objective:   Vitals:   08/17/18 0856 08/17/18 0949  BP: (!) 144/78 118/72  Pulse: 66   Resp: 16   Temp: 98.5 F (36.9 C)   SpO2: 98%    Filed Weights   08/17/18 0856  Weight: 200 lb 6.4 oz (90.9 kg)   Body mass index is 35.5 kg/m.  BP Readings from Last 3 Encounters:  08/17/18 118/72  01/26/18 (!) 158/84  07/28/17 138/82    Wt Readings from Last 3 Encounters:  08/17/18 200 lb 6.4 oz (90.9 kg)  01/26/18 187 lb (84.8 kg)  07/28/17 198 lb (89.8 kg)     Physical Exam Constitutional: She appears well-developed and well-nourished. No distress.  HENT:  Head: Normocephalic and atraumatic.  Right Ear: External ear normal. Normal ear canal and TM Left Ear: External ear normal.  Normal ear canal and TM Mouth/Throat: Oropharynx is clear and moist.  Eyes: Conjunctivae and EOM are normal.  Neck: Neck supple. No tracheal deviation present.  No thyromegaly present.  No carotid bruit  Cardiovascular: Normal rate, regular rhythm and normal heart sounds.   No murmur heard.  No edema. Pulmonary/Chest: Effort normal and breath sounds normal. No respiratory distress. She has no wheezes. She has no rales.  Breast: deferred   Abdominal: Soft. She exhibits no distension. There is no tenderness.  Lymphadenopathy: She  has no cervical adenopathy.  Skin: Skin is warm and dry. She is not diaphoretic.  Psychiatric: She has a normal mood and affect. Her behavior is normal.        Assessment & Plan:   Physical exam: Screening blood work ordered Immunizations  Discussed shingrix, others up to date Colonoscopy  Never had one - she understands the pros and cons of the test.  She does not want to have one done.  Discussed Cologuard-asked her to ask her insurance company if covered.  Discussed this is not as good, but better than nothing Mammogram   Up to date  Gyn   Up to date  Eye exams   Not up to date - will schedule Exercise  Occasionally walks, but not regularly -- stressed regular exercise  Weight  Obese - Advised weight loss.  Discussed the importance of smaller portions, healthy diet and regular exercise. Skin  No concerns Substance abuse  Smoking daily, no other abuse  See Problem List for Assessment and Plan of chronic medical problems.   Follow-up in 6 months

## 2018-08-17 ENCOUNTER — Other Ambulatory Visit (INDEPENDENT_AMBULATORY_CARE_PROVIDER_SITE_OTHER): Payer: BLUE CROSS/BLUE SHIELD

## 2018-08-17 ENCOUNTER — Other Ambulatory Visit: Payer: Self-pay

## 2018-08-17 ENCOUNTER — Encounter: Payer: Self-pay | Admitting: Internal Medicine

## 2018-08-17 ENCOUNTER — Ambulatory Visit (INDEPENDENT_AMBULATORY_CARE_PROVIDER_SITE_OTHER): Payer: BLUE CROSS/BLUE SHIELD | Admitting: Internal Medicine

## 2018-08-17 VITALS — BP 118/72 | HR 66 | Temp 98.5°F | Resp 16 | Ht 63.0 in | Wt 200.4 lb

## 2018-08-17 DIAGNOSIS — I10 Essential (primary) hypertension: Secondary | ICD-10-CM | POA: Diagnosis not present

## 2018-08-17 DIAGNOSIS — Z0000 Encounter for general adult medical examination without abnormal findings: Secondary | ICD-10-CM

## 2018-08-17 DIAGNOSIS — Z720 Tobacco use: Secondary | ICD-10-CM

## 2018-08-17 DIAGNOSIS — E7849 Other hyperlipidemia: Secondary | ICD-10-CM | POA: Diagnosis not present

## 2018-08-17 DIAGNOSIS — R7303 Prediabetes: Secondary | ICD-10-CM | POA: Diagnosis not present

## 2018-08-17 DIAGNOSIS — F319 Bipolar disorder, unspecified: Secondary | ICD-10-CM

## 2018-08-17 DIAGNOSIS — F17210 Nicotine dependence, cigarettes, uncomplicated: Secondary | ICD-10-CM | POA: Insufficient documentation

## 2018-08-17 LAB — COMPREHENSIVE METABOLIC PANEL
ALT: 18 U/L (ref 0–35)
AST: 22 U/L (ref 0–37)
Albumin: 4.2 g/dL (ref 3.5–5.2)
Alkaline Phosphatase: 84 U/L (ref 39–117)
BUN: 16 mg/dL (ref 6–23)
CO2: 25 mEq/L (ref 19–32)
Calcium: 9.1 mg/dL (ref 8.4–10.5)
Chloride: 98 mEq/L (ref 96–112)
Creatinine, Ser: 0.98 mg/dL (ref 0.40–1.20)
GFR: 59.2 mL/min — ABNORMAL LOW (ref >60.00)
Glucose, Bld: 116 mg/dL — ABNORMAL HIGH (ref 70–99)
Potassium: 3.3 mEq/L — ABNORMAL LOW (ref 3.5–5.1)
Sodium: 132 mEq/L — ABNORMAL LOW (ref 135–145)
Total Bilirubin: 0.5 mg/dL (ref 0.2–1.2)
Total Protein: 8 g/dL (ref 6.0–8.3)

## 2018-08-17 LAB — CBC WITH DIFFERENTIAL/PLATELET
Basophils Absolute: 0.1 10*3/uL (ref 0.0–0.1)
Basophils Relative: 0.5 % (ref 0.0–3.0)
Eosinophils Absolute: 0.1 10*3/uL (ref 0.0–0.7)
Eosinophils Relative: 1 % (ref 0.0–5.0)
HCT: 39.1 % (ref 36.0–46.0)
Hemoglobin: 13.7 g/dL (ref 12.0–15.0)
Lymphocytes Relative: 40.5 % (ref 12.0–46.0)
Lymphs Abs: 5.2 10*3/uL — ABNORMAL HIGH (ref 0.7–4.0)
MCHC: 35.1 g/dL (ref 30.0–36.0)
MCV: 92.3 fl (ref 78.0–100.0)
Monocytes Absolute: 0.7 10*3/uL (ref 0.1–1.0)
Monocytes Relative: 5.4 % (ref 3.0–12.0)
Neutro Abs: 6.7 10*3/uL (ref 1.4–7.7)
Neutrophils Relative %: 52.6 % (ref 43.0–77.0)
Platelets: 200 10*3/uL (ref 150.0–400.0)
RBC: 4.24 Mil/uL (ref 3.87–5.11)
RDW: 14.4 % (ref 11.5–15.5)
WBC: 12.8 10*3/uL — ABNORMAL HIGH (ref 4.0–10.5)

## 2018-08-17 LAB — LIPID PANEL
Cholesterol: 154 mg/dL (ref 0–200)
HDL: 50.4 mg/dL (ref >39.00)
LDL Cholesterol: 83 mg/dL (ref 0–99)
NonHDL: 103.94
Total CHOL/HDL Ratio: 3
Triglycerides: 105 mg/dL (ref 0.0–149.0)
VLDL: 21 mg/dL (ref 0.0–40.0)

## 2018-08-17 LAB — HEMOGLOBIN A1C: Hgb A1c MFr Bld: 5.9 % (ref 4.6–6.5)

## 2018-08-17 LAB — TSH: TSH: 3.48 u[IU]/mL (ref 0.35–4.50)

## 2018-08-17 MED ORDER — NEBIVOLOL HCL 5 MG PO TABS: 5.0000 mg | ORAL_TABLET | Freq: Every day | ORAL | 1 refills | Status: DC

## 2018-08-17 MED ORDER — AMLODIPINE BESYLATE 5 MG PO TABS: 5.0000 mg | ORAL_TABLET | Freq: Every day | ORAL | 3 refills | Status: DC

## 2018-08-17 MED ORDER — ROSUVASTATIN CALCIUM 10 MG PO TABS: 10.0000 mg | ORAL_TABLET | Freq: Every day | ORAL | 1 refills | Status: DC

## 2018-08-17 NOTE — Assessment & Plan Note (Signed)
Check lipid panel TSH, CMP Continue daily statin Regular exercise and healthy diet encouraged

## 2018-08-17 NOTE — Assessment & Plan Note (Signed)
Smoking cessation was discussed for more than 3 minutes.  The patient was counseled on the dangers of tobacco use, and was advised to quit and reluctant to quit.  Reviewed ways of quitting smoking including nicotine replacement, vapping/e-cigarettes, cold turkey, weaning off cigarettes, and pharmacotherapy (wellbutrin and chantix).  She does want to quit, but is not 100% committed to quitting.   

## 2018-08-17 NOTE — Assessment & Plan Note (Signed)
Following with psychiatry-management per psych

## 2018-08-17 NOTE — Assessment & Plan Note (Signed)
BP well controlled Current regimen effective and well tolerated Continue current medications at current doses CMP 

## 2018-08-17 NOTE — Assessment & Plan Note (Signed)
Check a1c Low sugar / carb diet Stressed regular exercise Stressed the importance of weight loss

## 2018-10-16 ENCOUNTER — Other Ambulatory Visit: Payer: Self-pay | Admitting: Internal Medicine

## 2019-02-21 NOTE — Patient Instructions (Addendum)
  Tests ordered today. Your results will be released to MyChart (or called to you) after review.  If any changes need to be made, you will be notified at that same time.   Flu immunization administered today.    Medications reviewed and updated.  Changes include :   none  Your prescription(s) have been submitted to your pharmacy. Please take as directed and contact our office if you believe you are having problem(s) with the medication(s).    Please followup in 6 months   

## 2019-02-21 NOTE — Progress Notes (Signed)
Subjective:    Patient ID: Kylie Wright, female    DOB: 1966-03-10, 53 y.o.   MRN: [id number]  HPI The patient is here for follow up.  She is not exercising regularly.    Hypertension: She is taking her medication daily. She is compliant with a low sodium diet.  She denies chest pain, palpitations, edema, shortness of breath and regular headaches.    Hyperlipidemia: She is taking her medication daily. She is compliant with a low fat/cholesterol diet. She denies myalgias.   Prediabetes:  She is not compliant with a low sugar/carbohydrate diet.  She is not exercising regularly.  Tobacco abuse:  She is still smoking and is not ready to quit.      Medications and allergies reviewed with patient and updated if appropriate.  Patient Active Problem List   Diagnosis Date Noted  . Obese 02/22/2019  . Tobacco abuse 08/17/2018  . Hypertension 07/28/2017  . Prediabetes 07/29/2016  . ADD (attention deficit disorder) 01/01/2016  . Syncope 01/01/2016  . History of alcohol abuse 01/01/2016  . History of drug abuse ([place]) 01/01/2016  . Bipolar disorder ([place]) 01/01/2016  . Hyperlipidemia 01/01/2016  . PTSD (post-traumatic stress disorder) 01/01/2016  . Osteoarthritis of both feet 01/01/2016  . Bilateral hip pain 01/01/2016  . IC (interstitial cystitis) 07/25/2012    Current Outpatient Medications on File Prior to Visit  Medication Sig Dispense Refill  . amLODipine (NORVASC) 5 MG tablet Take 1 tablet (5 mg total) by mouth daily. 90 tablet 3  . amphetamine-dextroamphetamine (ADDERALL XR) 20 MG 24 hr capsule Take 20 mg by mouth every morning.  0  . azelastine (OPTIVAR) 0.05 % ophthalmic solution Place 1 drop into both eyes 2 (two) times daily. 6 mL 12  . hydrochlorothiazide (HYDRODIURIL) 50 MG tablet Take 1 tablet (50 mg total) by mouth every morning. 30 tablet 5  . loratadine (CLARITIN) 10 MG tablet Take 10 mg by mouth daily as needed for allergies.    . Melatonin 10 MG TABS Take by  mouth.    . meloxicam (MOBIC) 7.5 MG tablet TAKE 1 TABLET BY MOUTH TWICE A DAY AS NEEDED PAIN 60 tablet 1  . nebivolol (BYSTOLIC) 5 MG tablet Take 1 tablet (5 mg total) by mouth daily. 90 tablet 1  . OLANZapine (ZYPREXA) 5 MG tablet Take 5 mg by mouth at bedtime.    . rosuvastatin (CRESTOR) 10 MG tablet Take 1 tablet (10 mg total) by mouth daily. 90 tablet 1  . VESICARE 10 MG tablet Take 10 mg by mouth daily.  3   No current facility-administered medications on file prior to visit.     Past Medical History:  Diagnosis Date  . ADHD (attention deficit hyperactivity disorder)   . Bipolar 1 disorder ([place])   . History of alcohol abuse   . History of drug abuse ([place])   . Interstitial cystitis     Past Surgical History:  Procedure Laterality Date  . ADENOIDECTOMY  1971  . APPENDECTOMY    . SALPINGECTOMY  1993  . vaginal surgery      Social History   Socioeconomic History  . Marital status: Single    Spouse name: Not on file  . Number of children: 1  . Years of education: Not on file  . Highest education level: Not on file  Occupational History    Employer: [employer]  Social Needs  . Financial resource strain: Not on file  . Food insecurity  Worry: Not on file    Inability: Not on file  . Transportation needs    Medical: Not on file    Non-medical: Not on file  Tobacco Use  . Smoking status: Current Every Day Smoker    Packs/day: 0.50    Types: Cigarettes  . Smokeless tobacco: Never Used  Substance and Sexual Activity  . Alcohol use: No    Alcohol/week: 0.0 standard drinks  . Drug use: No    Comment: h/o abuse hasn't used since 2002  . Sexual activity: Not Currently    Partners: Male    Birth control/protection: None  Lifestyle  . Physical activity    Days per week: Not on file    Minutes per session: Not on file  . Stress: Not on file  Relationships  . Social Herbalist on phone: Not on file    Gets together: Not on file    Attends  religious service: Not on file    Active member of club or organization: Not on file    Attends meetings of clubs or organizations: Not on file    Relationship status: Not on file  Other Topics Concern  . Not on file  Social History Narrative   Exercise:  Walks a lot, tries to stretch     Family History  Problem Relation Age of Onset  . Arthritis Mother   . Hyperlipidemia Mother   . Heart disease Mother   . Stroke Mother   . Hypertension Mother   . Diabetes Mother   . Mental illness Mother   . Alcohol abuse Father   . Hypertension Other   . Diabetes Other   . Mental illness Other   . Cancer Other   . Cancer Sister 34       small intestine   . Alcoholism Other     Review of Systems  Constitutional: Negative for chills and fever.  Respiratory: Negative for cough, shortness of breath and wheezing.   Cardiovascular: Negative for chest pain, palpitations and leg swelling.  Neurological: Negative for light-headedness and headaches.       Objective:   Vitals:   02/22/19 0955  BP: 126/78  Pulse: 63  Resp: 16  Temp: 98.2 F (36.8 C)  SpO2: 98%   BP Readings from Last 3 Encounters:  02/22/19 126/78  08/17/18 118/72  01/26/18 (!) 158/84   Wt Readings from Last 3 Encounters:  02/22/19 206 lb 12.8 oz (93.8 kg)  08/17/18 200 lb 6.4 oz (90.9 kg)  01/26/18 187 lb (84.8 kg)   Body mass index is 36.63 kg/m.   Physical Exam    Constitutional: Appears well-developed and well-nourished. No distress.  HENT:  Head: Normocephalic and atraumatic.  Neck: Neck supple. No tracheal deviation present. No thyromegaly present.  No cervical lymphadenopathy Cardiovascular: Normal rate, regular rhythm and normal heart sounds.  No murmur heard. No carotid bruit .  No edema Pulmonary/Chest: Effort normal and breath sounds normal. No respiratory distress. No has no wheezes. No rales.  Skin: Skin is warm and dry. Not diaphoretic.  Psychiatric: Normal mood and affect. Behavior is  normal.      Assessment & Plan:    See Problem List for Assessment and Plan of chronic medical problems.     This visit occurred during the SARS-CoV-2 public health emergency.  Safety protocols were in place, including screening questions prior to the visit, additional usage of staff PPE, and extensive cleaning of exam room while observing appropriate  contact time as indicated for disinfecting solutions.

## 2019-02-22 ENCOUNTER — Other Ambulatory Visit: Payer: Self-pay

## 2019-02-22 ENCOUNTER — Encounter: Payer: Self-pay | Admitting: Internal Medicine

## 2019-02-22 ENCOUNTER — Ambulatory Visit (INDEPENDENT_AMBULATORY_CARE_PROVIDER_SITE_OTHER): Payer: BC Managed Care – PPO | Admitting: Internal Medicine

## 2019-02-22 ENCOUNTER — Other Ambulatory Visit (INDEPENDENT_AMBULATORY_CARE_PROVIDER_SITE_OTHER): Payer: BC Managed Care – PPO

## 2019-02-22 VITALS — BP 126/78 | HR 63 | Temp 98.2°F | Resp 16 | Ht 63.0 in | Wt 206.8 lb

## 2019-02-22 DIAGNOSIS — I10 Essential (primary) hypertension: Secondary | ICD-10-CM

## 2019-02-22 DIAGNOSIS — E7849 Other hyperlipidemia: Secondary | ICD-10-CM

## 2019-02-22 DIAGNOSIS — Z720 Tobacco use: Secondary | ICD-10-CM

## 2019-02-22 DIAGNOSIS — Z23 Encounter for immunization: Secondary | ICD-10-CM | POA: Diagnosis not present

## 2019-02-22 DIAGNOSIS — Z6836 Body mass index (BMI) 36.0-36.9, adult: Secondary | ICD-10-CM

## 2019-02-22 DIAGNOSIS — R7303 Prediabetes: Secondary | ICD-10-CM

## 2019-02-22 DIAGNOSIS — E669 Obesity, unspecified: Secondary | ICD-10-CM | POA: Insufficient documentation

## 2019-02-22 LAB — COMPREHENSIVE METABOLIC PANEL
ALT: 24 U/L (ref 0–35)
AST: 27 U/L (ref 0–37)
Albumin: 4.4 g/dL (ref 3.5–5.2)
Alkaline Phosphatase: 90 U/L (ref 39–117)
BUN: 17 mg/dL (ref 6–23)
CO2: 28 mEq/L (ref 19–32)
Calcium: 9.2 mg/dL (ref 8.4–10.5)
Chloride: 97 mEq/L (ref 96–112)
Creatinine, Ser: 0.94 mg/dL (ref 0.40–1.20)
GFR: 62 mL/min (ref >60.00)
Glucose, Bld: 107 mg/dL — ABNORMAL HIGH (ref 70–99)
Potassium: 3.8 mEq/L (ref 3.5–5.1)
Sodium: 133 mEq/L — ABNORMAL LOW (ref 135–145)
Total Bilirubin: 0.6 mg/dL (ref 0.2–1.2)
Total Protein: 8 g/dL (ref 6.0–8.3)

## 2019-02-22 LAB — CBC WITH DIFFERENTIAL/PLATELET
Basophils Absolute: 0.1 10*3/uL (ref 0.0–0.1)
Basophils Relative: 0.5 % (ref 0.0–3.0)
Eosinophils Absolute: 0.1 10*3/uL (ref 0.0–0.7)
Eosinophils Relative: 0.6 % (ref 0.0–5.0)
HCT: 43 % (ref 36.0–46.0)
Hemoglobin: 14.6 g/dL (ref 12.0–15.0)
Lymphocytes Relative: 35.3 % (ref 12.0–46.0)
Lymphs Abs: 4.8 10*3/uL — ABNORMAL HIGH (ref 0.7–4.0)
MCHC: 34 g/dL (ref 30.0–36.0)
MCV: 93.7 fl (ref 78.0–100.0)
Monocytes Absolute: 0.7 10*3/uL (ref 0.1–1.0)
Monocytes Relative: 5 % (ref 3.0–12.0)
Neutro Abs: 8 10*3/uL — ABNORMAL HIGH (ref 1.4–7.7)
Neutrophils Relative %: 58.6 % (ref 43.0–77.0)
Platelets: 204 10*3/uL (ref 150.0–400.0)
RBC: 4.59 Mil/uL (ref 3.87–5.11)
RDW: 14.1 % (ref 11.5–15.5)
WBC: 13.7 10*3/uL — ABNORMAL HIGH (ref 4.0–10.5)

## 2019-02-22 LAB — LIPID PANEL
Cholesterol: 180 mg/dL (ref 0–200)
HDL: 41.3 mg/dL (ref >39.00)
LDL Cholesterol: 115 mg/dL — ABNORMAL HIGH (ref 0–99)
NonHDL: 138.91
Total CHOL/HDL Ratio: 4
Triglycerides: 119 mg/dL (ref 0.0–149.0)
VLDL: 23.8 mg/dL (ref 0.0–40.0)

## 2019-02-22 LAB — HEMOGLOBIN A1C: Hgb A1c MFr Bld: 5.7 % (ref 4.6–6.5)

## 2019-02-22 MED ORDER — HYDROCHLOROTHIAZIDE 50 MG PO TABS: 50.0000 mg | ORAL_TABLET | Freq: Every morning | ORAL | 1 refills | Status: DC

## 2019-02-22 MED ORDER — MELOXICAM 7.5 MG PO TABS: ORAL_TABLET | ORAL | 1 refills | Status: DC

## 2019-02-22 MED ORDER — AMLODIPINE BESYLATE 5 MG PO TABS: 5.0000 mg | ORAL_TABLET | Freq: Every day | ORAL | 1 refills | Status: DC

## 2019-02-22 MED ORDER — NEBIVOLOL HCL 5 MG PO TABS: 5.0000 mg | ORAL_TABLET | Freq: Every day | ORAL | 1 refills | Status: DC

## 2019-02-22 MED ORDER — ROSUVASTATIN CALCIUM 10 MG PO TABS: 10.0000 mg | ORAL_TABLET | Freq: Every day | ORAL | 1 refills | Status: DC

## 2019-02-22 NOTE — Assessment & Plan Note (Signed)
Check a1c Low sugar / carb diet Stressed regular exercise   

## 2019-02-22 NOTE — Assessment & Plan Note (Signed)
Smoking, not ready to quit She understands she needs to quit

## 2019-02-22 NOTE — Assessment & Plan Note (Signed)
BP well controlled Current regimen effective and well tolerated Continue current medications at current doses cmp  

## 2019-02-22 NOTE — Assessment & Plan Note (Signed)
Check lipid panel  Continue daily statin Regular exercise and healthy diet encouraged  

## 2019-02-22 NOTE — Assessment & Plan Note (Addendum)
With prediabetes, hichol, htn Discussed importance of weight loss Stressed regular exercise - goal 30 minutes 5 days a week Discussed decreasing portions, decreasing sugars/carbs Increase veges, lean protin Consider nutrition referral F/u in 6 months

## 2019-02-28 ENCOUNTER — Other Ambulatory Visit: Payer: Self-pay | Admitting: Internal Medicine

## 2019-07-08 ENCOUNTER — Other Ambulatory Visit: Payer: Self-pay | Admitting: Internal Medicine

## 2019-08-13 ENCOUNTER — Other Ambulatory Visit: Payer: Self-pay | Admitting: Internal Medicine

## 2019-08-22 NOTE — Progress Notes (Signed)
Subjective:    Patient ID: Kylie Wright, female    DOB: Aug 10, 1965, 54 y.o.   MRN: [id number]  HPI The patient is here for follow up of their chronic medical problems, including htn, hyperlipidemia, prediabetes, feet OA, tobacco abuse.  She is not exercising regularly.     Her BP at home recently has been high, but this is due to increased stress.  Prior to that it was better.    Medications and allergies reviewed with patient and updated if appropriate.  Patient Active Problem List   Diagnosis Date Noted  . Obese 02/22/2019  . Tobacco abuse 08/17/2018  . Hypertension 07/28/2017  . Prediabetes 07/29/2016  . ADD (attention deficit disorder) 01/01/2016  . Syncope 01/01/2016  . History of alcohol abuse 01/01/2016  . History of drug abuse ([place]) 01/01/2016  . Bipolar disorder ([place]) 01/01/2016  . Hyperlipidemia 01/01/2016  . PTSD (post-traumatic stress disorder) 01/01/2016  . Osteoarthritis of both feet 01/01/2016  . Bilateral hip pain 01/01/2016  . IC (interstitial cystitis) 07/25/2012    Current Outpatient Medications on File Prior to Visit  Medication Sig Dispense Refill  . amLODipine (NORVASC) 5 MG tablet Take 1 tablet (5 mg total) by mouth daily. 90 tablet 1  . amphetamine-dextroamphetamine (ADDERALL XR) 20 MG 24 hr capsule Take 20 mg by mouth every morning.  0  . azelastine (OPTIVAR) 0.05 % ophthalmic solution Place 1 drop into both eyes 2 (two) times daily. 6 mL 12  . hydrochlorothiazide (HYDRODIURIL) 50 MG tablet Take 1 tablet (50 mg total) by mouth every morning. 90 tablet 1  . loratadine (CLARITIN) 10 MG tablet Take 10 mg by mouth daily as needed for allergies.    . Melatonin 10 MG TABS Take by mouth.    . meloxicam (MOBIC) 7.5 MG tablet TAKE 1 TABLET BY MOUTH TWICE A DAY AS NEEDED PAIN 60 tablet 1  . nebivolol (BYSTOLIC) 5 MG tablet Take 1 tablet (5 mg total) by mouth daily. 90 tablet 1  . OLANZapine (ZYPREXA) 5 MG tablet Take 5 mg by mouth at bedtime.    .  rosuvastatin (CRESTOR) 10 MG tablet TAKE 1 TABLET BY MOUTH DAILY 90 tablet 0  . VESICARE 10 MG tablet Take 10 mg by mouth daily.  3   No current facility-administered medications on file prior to visit.    Past Medical History:  Diagnosis Date  . ADHD (attention deficit hyperactivity disorder)   . Bipolar 1 disorder ([place])   . History of alcohol abuse   . History of drug abuse ([place])   . Interstitial cystitis     Past Surgical History:  Procedure Laterality Date  . ADENOIDECTOMY  1971  . APPENDECTOMY    . SALPINGECTOMY  1993  . vaginal surgery      Social History   Socioeconomic History  . Marital status: Single    Spouse name: Not on file  . Number of children: 1  . Years of education: Not on file  . Highest education level: Not on file  Occupational History    Employer: [employer]  Tobacco Use  . Smoking status: Current Every Day Smoker    Packs/day: 0.50    Types: Cigarettes  . Smokeless tobacco: Never Used  Substance and Sexual Activity  . Alcohol use: No    Alcohol/week: 0.0 standard drinks  . Drug use: No    Comment: h/o abuse hasn't used since 2002  . Sexual activity: Not Currently    Partners: Male  Birth control/protection: None  Other Topics Concern  . Not on file  Social History Narrative   Exercise:  Walks a lot, tries to [place]    Social Determinants of Health   Financial Resource Strain:   . Difficulty of Paying Living Expenses:   Food Insecurity:   . Worried About Charity fundraiser in the Last Year:   . Arboriculturist in the Last Year:   Transportation Needs:   . Film/video editor (Medical):   Marland Kitchen Lack of Transportation (Non-Medical):   Physical Activity:   . Days of Exercise per Week:   . Minutes of Exercise per Session:   Stress:   . Feeling of Stress :   Social Connections:   . Frequency of Communication with Friends and Family:   . Frequency of Social Gatherings with Friends and Family:   . Attends Religious  Services:   . Active Member of Clubs or Organizations:   . Attends Archivist Meetings:   Marland Kitchen Marital Status:     Family History  Problem Relation Age of Onset  . Arthritis Mother   . Hyperlipidemia Mother   . Heart disease Mother   . Stroke Mother   . Hypertension Mother   . Diabetes Mother   . Mental illness Mother   . Alcohol abuse Father   . Hypertension Other   . Diabetes Other   . Mental illness Other   . Cancer Other   . Cancer Sister 100       small intestine   . Alcoholism Other     Review of Systems  Constitutional: Negative for chills and fever.  Respiratory: Negative for cough, shortness of breath and wheezing.   Cardiovascular: Negative for chest pain, palpitations and leg swelling.  Neurological: Positive for headaches (rare). Negative for light-headedness.       Objective:   Vitals:   08/23/19 0859  BP: (!) 148/80  Pulse: 63  Resp: 16  Temp: 98.9 F (37.2 C)  SpO2: 97%   BP Readings from Last 3 Encounters:  08/23/19 (!) 148/80  02/22/19 126/78  08/17/18 118/72   Wt Readings from Last 3 Encounters:  08/23/19 206 lb 12.8 oz (93.8 kg)  02/22/19 206 lb 12.8 oz (93.8 kg)  08/17/18 200 lb 6.4 oz (90.9 kg)   Body mass index is 36.63 kg/m.   Physical Exam    Constitutional: Appears well-developed and well-nourished. No distress.  HENT:  Head: Normocephalic and atraumatic.  Neck: Neck supple. No tracheal deviation present. No thyromegaly present.  No cervical lymphadenopathy Cardiovascular: Normal rate, regular rhythm and normal heart sounds.   2/6 systolic murmur heard. No carotid bruit .  No edema Pulmonary/Chest: Effort normal and breath sounds normal. No respiratory distress. No has no wheezes. No rales.  Skin: Skin is warm and dry. Not diaphoretic.  Psychiatric: Normal mood and affect. Behavior is normal.      Assessment & Plan:    See Problem List for Assessment and Plan of chronic medical problems.    This visit  occurred during the SARS-CoV-2 public health emergency.  Safety protocols were in place, including screening questions prior to the visit, additional usage of staff PPE, and extensive cleaning of exam room while observing appropriate contact time as indicated for disinfecting solutions.

## 2019-08-22 NOTE — Patient Instructions (Addendum)
  Blood work was ordered.     Medications reviewed and updated.  Changes include :   none  Your prescription(s) have been submitted to your pharmacy. Please take as directed and contact our office if you believe you are having problem(s) with the medication(s).   Please followup in 6 months   

## 2019-08-23 ENCOUNTER — Other Ambulatory Visit: Payer: Self-pay

## 2019-08-23 ENCOUNTER — Encounter: Payer: Self-pay | Admitting: Internal Medicine

## 2019-08-23 ENCOUNTER — Ambulatory Visit: Payer: BC Managed Care – PPO | Admitting: Internal Medicine

## 2019-08-23 VITALS — BP 148/80 | HR 63 | Temp 98.9°F | Resp 16 | Ht 63.0 in | Wt 206.8 lb

## 2019-08-23 DIAGNOSIS — I10 Essential (primary) hypertension: Secondary | ICD-10-CM

## 2019-08-23 DIAGNOSIS — E7849 Other hyperlipidemia: Secondary | ICD-10-CM

## 2019-08-23 DIAGNOSIS — R7303 Prediabetes: Secondary | ICD-10-CM | POA: Diagnosis not present

## 2019-08-23 DIAGNOSIS — Z720 Tobacco use: Secondary | ICD-10-CM

## 2019-08-23 DIAGNOSIS — M19072 Primary osteoarthritis, left ankle and foot: Secondary | ICD-10-CM

## 2019-08-23 DIAGNOSIS — M19071 Primary osteoarthritis, right ankle and foot: Secondary | ICD-10-CM

## 2019-08-23 LAB — COMPREHENSIVE METABOLIC PANEL
ALT: 22 U/L (ref 0–35)
AST: 23 U/L (ref 0–37)
Albumin: 4.3 g/dL (ref 3.5–5.2)
Alkaline Phosphatase: 92 U/L (ref 39–117)
BUN: 14 mg/dL (ref 6–23)
CO2: 26 mEq/L (ref 19–32)
Calcium: 9.4 mg/dL (ref 8.4–10.5)
Chloride: 100 mEq/L (ref 96–112)
Creatinine, Ser: 0.92 mg/dL (ref 0.40–1.20)
GFR: 63.44 mL/min (ref >60.00)
Glucose, Bld: 124 mg/dL — ABNORMAL HIGH (ref 70–99)
Potassium: 3.7 mEq/L (ref 3.5–5.1)
Sodium: 131 mEq/L — ABNORMAL LOW (ref 135–145)
Total Bilirubin: 0.5 mg/dL (ref 0.2–1.2)
Total Protein: 8.1 g/dL (ref 6.0–8.3)

## 2019-08-23 LAB — CBC WITH DIFFERENTIAL/PLATELET
Basophils Absolute: 0 10*3/uL (ref 0.0–0.1)
Basophils Relative: 0.3 % (ref 0.0–3.0)
Eosinophils Absolute: 0.1 10*3/uL (ref 0.0–0.7)
Eosinophils Relative: 0.9 % (ref 0.0–5.0)
HCT: 41.2 % (ref 36.0–46.0)
Hemoglobin: 13.9 g/dL (ref 12.0–15.0)
Lymphocytes Relative: 28.9 % (ref 12.0–46.0)
Lymphs Abs: 3.9 10*3/uL (ref 0.7–4.0)
MCHC: 33.8 g/dL (ref 30.0–36.0)
MCV: 94 fl (ref 78.0–100.0)
Monocytes Absolute: 0.6 10*3/uL (ref 0.1–1.0)
Monocytes Relative: 4.4 % (ref 3.0–12.0)
Neutro Abs: 8.9 10*3/uL — ABNORMAL HIGH (ref 1.4–7.7)
Neutrophils Relative %: 65.5 % (ref 43.0–77.0)
Platelets: 235 10*3/uL (ref 150.0–400.0)
RBC: 4.38 Mil/uL (ref 3.87–5.11)
RDW: 14.8 % (ref 11.5–15.5)
WBC: 13.6 10*3/uL — ABNORMAL HIGH (ref 4.0–10.5)

## 2019-08-23 LAB — LIPID PANEL
Cholesterol: 190 mg/dL (ref 0–200)
HDL: 46.9 mg/dL (ref >39.00)
LDL Cholesterol: 105 mg/dL — ABNORMAL HIGH (ref 0–99)
NonHDL: 142.98
Total CHOL/HDL Ratio: 4
Triglycerides: 188 mg/dL — ABNORMAL HIGH (ref 0.0–149.0)
VLDL: 37.6 mg/dL (ref 0.0–40.0)

## 2019-08-23 LAB — HEMOGLOBIN A1C: Hgb A1c MFr Bld: 5.7 % (ref 4.6–6.5)

## 2019-08-23 MED ORDER — NEBIVOLOL HCL 5 MG PO TABS: 5.0000 mg | ORAL_TABLET | Freq: Every day | ORAL | 1 refills | Status: DC

## 2019-08-23 MED ORDER — AMLODIPINE BESYLATE 5 MG PO TABS: 5.0000 mg | ORAL_TABLET | Freq: Every day | ORAL | 1 refills | Status: DC

## 2019-08-23 MED ORDER — ROSUVASTATIN CALCIUM 10 MG PO TABS: 10.0000 mg | ORAL_TABLET | Freq: Every day | ORAL | 1 refills | Status: DC

## 2019-08-23 MED ORDER — MELOXICAM 7.5 MG PO TABS: ORAL_TABLET | ORAL | 1 refills | Status: DC

## 2019-08-23 NOTE — Assessment & Plan Note (Signed)
Chronic Check a1c Low sugar / carb diet Stressed regular exercise  

## 2019-08-23 NOTE — Assessment & Plan Note (Signed)
Chronic Taking meloxicam daily - helps Will continue cmp

## 2019-08-23 NOTE — Assessment & Plan Note (Signed)
Chronic Check lipid panel, cmp Continue daily statin Regular exercise and healthy diet encouraged  

## 2019-08-23 NOTE — Assessment & Plan Note (Signed)
Still smoking Not ready to quit

## 2019-08-23 NOTE — Assessment & Plan Note (Signed)
Chronic With comorbidities of htn and prediabetes Discussed importance of weight loss Stressed regular exercise - goal 30 minutes 5 days a week Discussed decreasing portions, decreasing sugars/carbs Increase veges, lean protin F/u in 6 months

## 2019-08-23 NOTE — Assessment & Plan Note (Signed)
BP Readings from Last 3 Encounters:  08/23/19 (!) 148/80  02/22/19 126/78  08/17/18 118/72   Chronic BP well controlled Current regimen effective and well tolerated Continue current medications at current doses cmp

## 2019-11-06 ENCOUNTER — Other Ambulatory Visit: Payer: Self-pay | Admitting: Internal Medicine

## 2020-02-27 NOTE — Progress Notes (Signed)
Subjective:    Patient ID: Kylie Wright, female    DOB: October 21, 1965, 54 y.o.   MRN: [id number]   This visit occurred during the SARS-CoV-2 public health emergency.  Safety protocols were in place, including screening questions prior to the visit, additional usage of staff PPE, and extensive cleaning of exam room while observing appropriate contact time as indicated for disinfecting solutions.    HPI She is here for a physical exam.   She has been having hip pain.  She has started seeing a chiropractor and that is helping.    She started a new psych med and started having tremors - she has not get talked to her psychiatrist about this.    She has a spot on her face that will not go away.  It has been there a couple of months.    Medications and allergies reviewed with patient and updated if appropriate.  Patient Active Problem List   Diagnosis Date Noted  . Morbid obesity ([place]) 02/22/2019  . Tobacco dependence due to cigarettes 08/17/2018  . Hypertension 07/28/2017  . Prediabetes 07/29/2016  . ADD (attention deficit disorder) 01/01/2016  . Syncope 01/01/2016  . History of alcohol abuse 01/01/2016  . History of drug abuse ([place]) 01/01/2016  . Bipolar disorder ([place]) 01/01/2016  . Hyperlipidemia 01/01/2016  . PTSD (post-traumatic stress disorder) 01/01/2016  . Osteoarthritis of both feet 01/01/2016  . Bilateral hip pain 01/01/2016  . IC (interstitial cystitis) 07/25/2012    Current Outpatient Medications on File Prior to Visit  Medication Sig Dispense Refill  . amLODipine (NORVASC) 5 MG tablet Take 1 tablet (5 mg total) by mouth daily. 90 tablet 1  . amphetamine-dextroamphetamine (ADDERALL XR) 20 MG 24 hr capsule Take 20 mg by mouth every morning.  0  . azelastine (OPTIVAR) 0.05 % ophthalmic solution Place 1 drop into both eyes 2 (two) times daily. 6 mL 12  . hydrochlorothiazide (HYDRODIURIL) 50 MG tablet Take 1 tablet (50 mg total) by mouth every morning. 90 tablet 1  .  loratadine (CLARITIN) 10 MG tablet Take 10 mg by mouth daily as needed for allergies.    . Melatonin 10 MG TABS Take by mouth.    . meloxicam (MOBIC) 7.5 MG tablet TAKE 1 TABLET BY MOUTH TWICE A DAY AS NEEDED PAIN 180 tablet 1  . nebivolol (BYSTOLIC) 5 MG tablet Take 1 tablet (5 mg total) by mouth daily. 90 tablet 1  . rosuvastatin (CRESTOR) 10 MG tablet TAKE 1 TABLET BY MOUTH DAILY 90 tablet 1  . VESICARE 10 MG tablet Take 10 mg by mouth daily.  3  . OLANZapine-Samidorphan 5-10 MG TABS Take by mouth.     No current facility-administered medications on file prior to visit.    Past Medical History:  Diagnosis Date  . ADHD (attention deficit hyperactivity disorder)   . Bipolar 1 disorder ([place])   . History of alcohol abuse   . History of drug abuse ([place])   . Interstitial cystitis     Past Surgical History:  Procedure Laterality Date  . ADENOIDECTOMY  1971  . APPENDECTOMY    . SALPINGECTOMY  1993  . vaginal surgery      Social History   Socioeconomic History  . Marital status: Single    Spouse name: Not on file  . Number of children: 1  . Years of education: Not on file  . Highest education level: Not on file  Occupational History    Employer: [employer]  Tobacco Use  . Smoking status: Current Every Day Smoker    Packs/day: 0.50    Types: Cigarettes  . Smokeless tobacco: Never Used  Substance and Sexual Activity  . Alcohol use: No    Alcohol/week: 0.0 standard drinks  . Drug use: No    Comment: h/o abuse hasn't used since 2002  . Sexual activity: Not Currently    Partners: Male    Birth control/protection: None  Other Topics Concern  . Not on file  Social History Narrative   Exercise:  Walks a lot, tries to [place]    Social Determinants of Health   Financial Resource Strain: Not on file  Food Insecurity: Not on file  Transportation Needs: Not on file  Physical Activity: Not on file  Stress: Not on file  Social Connections: Not on file     Family History  Problem Relation Age of Onset  . Arthritis Mother   . Hyperlipidemia Mother   . Heart disease Mother   . Stroke Mother   . Hypertension Mother   . Diabetes Mother   . Mental illness Mother   . Alcohol abuse Father   . Hypertension Other   . Diabetes Other   . Mental illness Other   . Cancer Other   . Cancer Sister 26       small intestine   . Alcoholism Other     Review of Systems  Constitutional: Negative for chills and fever.  Eyes: Negative for visual disturbance.  Respiratory: Positive for shortness of breath (if walking long periods of time). Negative for cough and wheezing.   Cardiovascular: Negative for chest pain, palpitations and leg swelling.  Gastrointestinal: Positive for constipation (takes metamucil). Negative for abdominal pain, blood in stool, diarrhea and nausea.       No gerd  Genitourinary: Negative for dysuria and hematuria.  Musculoskeletal: Positive for arthralgias (diffuse OA).  Skin: Negative for rash.       Lesion on face  Neurological: Positive for tremors. Negative for light-headedness and headaches.  Psychiatric/Behavioral: Positive for dysphoric mood. The patient is nervous/anxious.        Objective:   Vitals:   02/28/20 0910  BP: (!) 148/84  Pulse: 74  Temp: 98.6 F (37 C)  SpO2: 98%   Filed Weights   02/28/20 0910  Weight: 203 lb 3.2 oz (92.2 kg)   Body mass index is 36 kg/m.  BP Readings from Last 3 Encounters:  02/28/20 (!) 148/84  08/23/19 (!) 148/80  02/22/19 126/78    Wt Readings from Last 3 Encounters:  02/28/20 203 lb 3.2 oz (92.2 kg)  08/23/19 206 lb 12.8 oz (93.8 kg)  02/22/19 206 lb 12.8 oz (93.8 kg)     Physical Exam Constitutional: She appears well-developed and well-nourished. No distress.  HENT:  Head: Normocephalic and atraumatic.  Right Ear: External ear normal. Normal ear canal and TM Left Ear: External ear normal.  Normal ear canal and TM Mouth/Throat: Oropharynx is clear and  moist.  Eyes: Conjunctivae and EOM are normal.  Neck: Neck supple. No tracheal deviation present. No thyromegaly present.  No carotid bruit  Cardiovascular: Normal rate, regular rhythm and normal heart sounds.   No murmur heard.  No edema. Pulmonary/Chest: Effort normal and breath sounds normal. No respiratory distress. She has no wheezes. She has no rales.  Breast: deferred   Abdominal: Soft. She exhibits no distension. There is no tenderness.  Lymphadenopathy: She has no cervical adenopathy.  Skin: Skin is warm and  dry. She is not diaphoretic.  Psychiatric: She has a normal mood and affect. Her behavior is normal.        Assessment & Plan:     Health Maintenance  Topic Date Due  . Hepatitis C Screening  Never done  . MAMMOGRAM  04/15/2019  . COLONOSCOPY  08/17/2023 (Originally 11/22/2014)  . PAP SMEAR-Modifier  05/01/2020  . TETANUS/TDAP  07/23/2026  . INFLUENZA VACCINE  Completed  . COVID-19 Vaccine  Completed  . HIV Screening  Completed      Screening blood work    ordered Immunizations  Advised shingrix Colonoscopy  Deferred - advised her to look into cologuard Mammogram  Not up to date  - will schedule Eye exams  Due now - will schedule Exercise  None right now Weight  Advised weight loss Substance abuse  Smokes - stressed smoking cessation      See Problem List for Assessment and Plan of chronic medical problems.

## 2020-02-27 NOTE — Patient Instructions (Addendum)
Blood work was ordered.     No immunization administered today.  Consider getting the shingles vaccines.    Medications changes include :   Increase bystolic to 10 mg daily  Your prescription(s) have been submitted to your pharmacy.    Please followup in 6 months    Health Maintenance, Female Adopting a healthy lifestyle and getting preventive care are important in promoting health and wellness. Ask your health care provider about:  The right schedule for you to have regular tests and exams.  Things you can do on your own to prevent diseases and keep yourself healthy. What should I know about diet, weight, and exercise? Eat a healthy diet   Eat a diet that includes plenty of vegetables, fruits, low-fat dairy products, and lean protein.  Do not eat a lot of foods that are high in solid fats, added sugars, or sodium. Maintain a healthy weight Body mass index (BMI) is used to identify weight problems. It estimates body fat based on height and weight. Your health care provider can help determine your BMI and help you achieve or maintain a healthy weight. Get regular exercise Get regular exercise. This is one of the most important things you can do for your health. Most adults should:  Exercise for at least 150 minutes each week. The exercise should increase your heart rate and make you sweat (moderate-intensity exercise).  Do strengthening exercises at least twice a week. This is in addition to the moderate-intensity exercise.  Spend less time sitting. Even light physical activity can be beneficial. Watch cholesterol and blood lipids Have your blood tested for lipids and cholesterol at 55 years of age, then have this test every 5 years. Have your cholesterol levels checked more often if:  Your lipid or cholesterol levels are high.  You are older than 55 years of age.  You are at high risk for heart disease. What should I know about cancer screening? Depending on your  health history and family history, you may need to have cancer screening at various ages. This may include screening for:  Breast cancer.  Cervical cancer.  Colorectal cancer.  Skin cancer.  Lung cancer. What should I know about heart disease, diabetes, and high blood pressure? Blood pressure and heart disease  High blood pressure causes heart disease and increases the risk of stroke. This is more likely to develop in people who have high blood pressure readings, are of African descent, or are overweight.  Have your blood pressure checked: ? Every 3-5 years if you are 28-26 years of age. ? Every year if you are 73 years old or older. Diabetes Have regular diabetes screenings. This checks your fasting blood sugar level. Have the screening done:  Once every three years after age 54 if you are at a normal weight and have a low risk for diabetes.  More often and at a younger age if you are overweight or have a high risk for diabetes. What should I know about preventing infection? Hepatitis B If you have a higher risk for hepatitis B, you should be screened for this virus. Talk with your health care provider to find out if you are at risk for hepatitis B infection. Hepatitis C Testing is recommended for:  Everyone born from [birth year] through 1966.  Anyone with known risk factors for hepatitis C. Sexually transmitted infections (STIs)  Get screened for STIs, including gonorrhea and chlamydia, if: ? You are sexually active and are younger than 55 years of age. ?  You are older than 55 years of age and your health care provider tells you that you are at risk for this type of infection. ? Your sexual activity has changed since you were last screened, and you are at increased risk for chlamydia or gonorrhea. Ask your health care provider if you are at risk.  Ask your health care provider about whether you are at high risk for HIV. Your health care provider may recommend a prescription  medicine to help prevent HIV infection. If you choose to take medicine to prevent HIV, you should first get tested for HIV. You should then be tested every 3 months for as long as you are taking the medicine. Pregnancy  If you are about to stop having your period (premenopausal) and you may become pregnant, seek counseling before you get pregnant.  Take 400 to 800 micrograms (mcg) of folic acid every day if you become pregnant.  Ask for birth control (contraception) if you want to prevent pregnancy. Osteoporosis and menopause Osteoporosis is a disease in which the bones lose minerals and strength with aging. This can result in bone fractures. If you are 51 years old or older, or if you are at risk for osteoporosis and fractures, ask your health care provider if you should:  Be screened for bone loss.  Take a calcium or vitamin D supplement to lower your risk of fractures.  Be given hormone replacement therapy (HRT) to treat symptoms of menopause. Follow these instructions at home: Lifestyle  Do not use any products that contain nicotine or tobacco, such as cigarettes, e-cigarettes, and chewing tobacco. If you need help quitting, ask your health care provider.  Do not use street drugs.  Do not share needles.  Ask your health care provider for help if you need support or information about quitting drugs. Alcohol use  Do not drink alcohol if: ? Your health care provider tells you not to drink. ? You are pregnant, may be pregnant, or are planning to become pregnant.  If you drink alcohol: ? Limit how much you use to 0-1 drink a day. ? Limit intake if you are breastfeeding.  Be aware of how much alcohol is in your drink. In the [place] one drink equals one 12 oz bottle of beer (355 mL), one 5 oz glass of wine (148 mL), or one 1 oz glass of hard liquor (44 mL). General instructions  Schedule regular health, dental, and eye exams.  Stay current with your vaccines.  Tell your health  care provider if: ? You often feel depressed. ? You have ever been abused or do not feel safe at home. Summary  Adopting a healthy lifestyle and getting preventive care are important in promoting health and wellness.  Follow your health care provider's instructions about healthy diet, exercising, and getting tested or screened for diseases.  Follow your health care provider's instructions on monitoring your cholesterol and blood pressure. This information is not intended to replace advice given to you by your health care provider. Make sure you discuss any questions you have with your health care provider. Document Revised: 02/28/2018 Document Reviewed: 02/28/2018 Elsevier Patient Education  2020 [employer].

## 2020-02-28 ENCOUNTER — Ambulatory Visit (INDEPENDENT_AMBULATORY_CARE_PROVIDER_SITE_OTHER): Payer: BC Managed Care – PPO | Admitting: Internal Medicine

## 2020-02-28 ENCOUNTER — Telehealth: Payer: Self-pay

## 2020-02-28 ENCOUNTER — Other Ambulatory Visit: Payer: Self-pay

## 2020-02-28 ENCOUNTER — Encounter: Payer: Self-pay | Admitting: Internal Medicine

## 2020-02-28 VITALS — BP 148/84 | HR 74 | Temp 98.6°F | Ht 63.0 in | Wt 203.2 lb

## 2020-02-28 DIAGNOSIS — F17210 Nicotine dependence, cigarettes, uncomplicated: Secondary | ICD-10-CM | POA: Diagnosis not present

## 2020-02-28 DIAGNOSIS — I10 Essential (primary) hypertension: Secondary | ICD-10-CM

## 2020-02-28 DIAGNOSIS — N3010 Interstitial cystitis (chronic) without hematuria: Secondary | ICD-10-CM

## 2020-02-28 DIAGNOSIS — Z0000 Encounter for general adult medical examination without abnormal findings: Secondary | ICD-10-CM | POA: Diagnosis not present

## 2020-02-28 DIAGNOSIS — E7849 Other hyperlipidemia: Secondary | ICD-10-CM | POA: Diagnosis not present

## 2020-02-28 DIAGNOSIS — R7303 Prediabetes: Secondary | ICD-10-CM

## 2020-02-28 DIAGNOSIS — F319 Bipolar disorder, unspecified: Secondary | ICD-10-CM

## 2020-02-28 LAB — COMPREHENSIVE METABOLIC PANEL
ALT: 19 U/L (ref 0–35)
AST: 25 U/L (ref 0–37)
Albumin: 4.3 g/dL (ref 3.5–5.2)
Alkaline Phosphatase: 82 U/L (ref 39–117)
BUN: 14 mg/dL (ref 6–23)
CO2: 27 mEq/L (ref 19–32)
Calcium: 9.1 mg/dL (ref 8.4–10.5)
Chloride: 101 mEq/L (ref 96–112)
Creatinine, Ser: 1.03 mg/dL (ref 0.40–1.20)
GFR: 61.32 mL/min (ref >60.00)
Glucose, Bld: 111 mg/dL — ABNORMAL HIGH (ref 70–99)
Potassium: 3.9 mEq/L (ref 3.5–5.1)
Sodium: 136 mEq/L (ref 135–145)
Total Bilirubin: 0.5 mg/dL (ref 0.2–1.2)
Total Protein: 8.2 g/dL (ref 6.0–8.3)

## 2020-02-28 LAB — CBC WITH DIFFERENTIAL/PLATELET
Basophils Absolute: 0.1 10*3/uL (ref 0.0–0.1)
Basophils Relative: 0.5 % (ref 0.0–3.0)
Eosinophils Absolute: 0.1 10*3/uL (ref 0.0–0.7)
Eosinophils Relative: 0.7 % (ref 0.0–5.0)
HCT: 41.2 % (ref 36.0–46.0)
Hemoglobin: 13.8 g/dL (ref 12.0–15.0)
Lymphocytes Relative: 30.6 % (ref 12.0–46.0)
Lymphs Abs: 4.2 10*3/uL — ABNORMAL HIGH (ref 0.7–4.0)
MCHC: 33.5 g/dL (ref 30.0–36.0)
MCV: 93.6 fl (ref 78.0–100.0)
Monocytes Absolute: 0.8 10*3/uL (ref 0.1–1.0)
Monocytes Relative: 5.5 % (ref 3.0–12.0)
Neutro Abs: 8.7 10*3/uL — ABNORMAL HIGH (ref 1.4–7.7)
Neutrophils Relative %: 62.7 % (ref 43.0–77.0)
Platelets: 189 10*3/uL (ref 150.0–400.0)
RBC: 4.4 Mil/uL (ref 3.87–5.11)
RDW: 14.6 % (ref 11.5–15.5)
WBC: 13.9 10*3/uL — ABNORMAL HIGH (ref 4.0–10.5)

## 2020-02-28 LAB — LIPID PANEL
Cholesterol: 157 mg/dL (ref 0–200)
HDL: 45.1 mg/dL (ref >39.00)
LDL Cholesterol: 89 mg/dL (ref 0–99)
NonHDL: 112.22
Total CHOL/HDL Ratio: 3
Triglycerides: 118 mg/dL (ref 0.0–149.0)
VLDL: 23.6 mg/dL (ref 0.0–40.0)

## 2020-02-28 LAB — HEMOGLOBIN A1C: Hgb A1c MFr Bld: 5.7 % (ref 4.6–6.5)

## 2020-02-28 MED ORDER — MELOXICAM 7.5 MG PO TABS: ORAL_TABLET | ORAL | 1 refills | Status: DC

## 2020-02-28 MED ORDER — NEBIVOLOL HCL 10 MG PO TABS: 10.0000 mg | ORAL_TABLET | Freq: Every day | ORAL | 3 refills | Status: DC

## 2020-02-28 MED ORDER — AMLODIPINE BESYLATE 5 MG PO TABS: 5.0000 mg | ORAL_TABLET | Freq: Every day | ORAL | 1 refills | Status: DC

## 2020-02-28 MED ORDER — ROSUVASTATIN CALCIUM 10 MG PO TABS: 10.0000 mg | ORAL_TABLET | Freq: Every day | ORAL | 1 refills | Status: DC

## 2020-02-28 MED ORDER — SHINGRIX 50 MCG/0.5ML IM SUSR: 0.5000 mL | Freq: Once | INTRAMUSCULAR | 1 refills | Status: AC

## 2020-02-28 NOTE — Assessment & Plan Note (Signed)
Chronic Check lipid panel  Continue crestor 10 mg daily Regular exercise and healthy diet encouraged  

## 2020-02-28 NOTE — Addendum Note (Signed)
Addended by: Trenda Moots on: 82/99/3716 10:41 AM   Modules accepted: Orders

## 2020-02-28 NOTE — Assessment & Plan Note (Signed)
Smoking cessation was discussed for more than 3 minutes.  The patient was counseled on the dangers of tobacco use, and was advised to quit and reluctant to quit.  Reviewed ways of quitting smoking including nicotine replacement, vapping/e-cigarettes, cold [place], weaning off cigarettes, and pharmacotherapy (wellbutrin and chantix).  She does want to quit, but is not 100% committed to quitting.

## 2020-02-28 NOTE — Assessment & Plan Note (Addendum)
Chronic BMI 36 with htn, hyperlipidemia Discussed importance of weight loss Stressed regular exercise - goal 30 minutes 5 days a week Discussed decreasing portions, decreasing sugars/carbs Increase veges, lean protin Consider nutrition referral

## 2020-02-28 NOTE — Telephone Encounter (Signed)
[place], [place], [place] [address] [phone] (ph) Fax # [phone]

## 2020-02-28 NOTE — Assessment & Plan Note (Signed)
Chronic Check a1c Low sugar / carb diet Stressed regular exercise  

## 2020-02-28 NOTE — Assessment & Plan Note (Addendum)
Chronic BP not controlled Continue amlodipine 5 mg daily, hctz 50 mg qd, and increase bystolic to 10 mg qd cmp

## 2020-02-28 NOTE — Assessment & Plan Note (Signed)
Chronic Taking hctz 50 mg daily  Following with urology

## 2020-02-28 NOTE — Addendum Note (Signed)
Addended by: Binnie Rail on: 02/28/2020 10:28 AM   Modules accepted: Orders

## 2020-02-28 NOTE — Assessment & Plan Note (Signed)
Chronic Management per psychiatry 

## 2020-03-02 NOTE — Telephone Encounter (Signed)
Faxed today

## 2020-04-24 ENCOUNTER — Other Ambulatory Visit: Payer: Self-pay | Admitting: Internal Medicine

## 2020-04-24 DIAGNOSIS — Z1231 Encounter for screening mammogram for malignant neoplasm of breast: Secondary | ICD-10-CM

## 2020-06-12 ENCOUNTER — Ambulatory Visit
Admission: RE | Admit: 2020-06-12 | Discharge: 2020-06-12 | Disposition: A | Discharge status: Home / Self Care | Payer: BC Managed Care – PPO | Source: Ambulatory Visit | Attending: Internal Medicine | Admitting: Internal Medicine

## 2020-06-12 ENCOUNTER — Other Ambulatory Visit: Payer: Self-pay

## 2020-06-12 DIAGNOSIS — Z1231 Encounter for screening mammogram for malignant neoplasm of breast: Secondary | ICD-10-CM

## 2020-07-23 ENCOUNTER — Telehealth: Payer: Self-pay | Admitting: Internal Medicine

## 2020-07-23 NOTE — Telephone Encounter (Signed)
Ok to order 

## 2020-07-23 NOTE — Telephone Encounter (Signed)
    Patient requesting order for Cologuard for screening. Patient states she has already checked Cologuard benefit with her insurance  Appointment rescheduled from 6/17 to 6/24

## 2020-07-24 ENCOUNTER — Other Ambulatory Visit: Payer: Self-pay

## 2020-07-24 DIAGNOSIS — Z1211 Encounter for screening for malignant neoplasm of colon: Secondary | ICD-10-CM

## 2020-07-24 NOTE — Telephone Encounter (Signed)
Ordered today 

## 2020-08-28 ENCOUNTER — Ambulatory Visit: Payer: BC Managed Care – PPO | Admitting: Internal Medicine

## 2020-08-28 LAB — COLOGUARD: Cologuard: NEGATIVE

## 2020-09-03 LAB — COLOGUARD: Cologuard: NEGATIVE

## 2020-09-04 ENCOUNTER — Ambulatory Visit: Payer: BC Managed Care – PPO | Admitting: Internal Medicine

## 2020-09-10 NOTE — Progress Notes (Signed)
Subjective:    Patient ID: Kylie Wright, female    DOB: 1965/12/22, 55 y.o.   MRN: [id number]  HPI The patient is here for follow up of their chronic medical problems, including htn, hld, prediabetes, feet OA, tobacco abuse  She has lost some weight.    Sees psych regularly   she sees chiropractor regularly.    BP at home 126/80 at times, sometimes higher  Medications and allergies reviewed with patient and updated if appropriate.  Patient Active Problem List   Diagnosis Date Noted   Morbid obesity ([facility]) 02/22/2019   Tobacco dependence due to cigarettes 08/17/2018   Hypertension 07/28/2017   Prediabetes 07/29/2016   ADD (attention deficit disorder) 01/01/2016   Syncope 01/01/2016   History of alcohol abuse 01/01/2016   History of drug abuse ([place]) 01/01/2016   Bipolar disorder ([place]) 01/01/2016   Hyperlipidemia 01/01/2016   PTSD (post-traumatic stress disorder) 01/01/2016   Osteoarthritis of both feet 01/01/2016   Bilateral hip pain 01/01/2016   IC (interstitial cystitis) 07/25/2012    Current Outpatient Medications on File Prior to Visit  Medication Sig Dispense Refill   amLODipine (NORVASC) 5 MG tablet Take 1 tablet (5 mg total) by mouth daily. 90 tablet 1   amphetamine-dextroamphetamine (ADDERALL XR) 20 MG 24 hr capsule Take 20 mg by mouth every morning.  0   azelastine (OPTIVAR) 0.05 % ophthalmic solution Place 1 drop into both eyes 2 (two) times daily. 6 mL 12   hydrochlorothiazide (HYDRODIURIL) 50 MG tablet Take 1 tablet (50 mg total) by mouth every morning. 90 tablet 1   loratadine (CLARITIN) 10 MG tablet Take 10 mg by mouth daily as needed for allergies.     Melatonin 10 MG TABS Take by mouth.     meloxicam (MOBIC) 7.5 MG tablet TAKE 1 TABLET BY MOUTH TWICE A DAY AS NEEDED PAIN 180 tablet 1   nebivolol (BYSTOLIC) 10 MG tablet Take 1 tablet (10 mg total) by mouth daily. 90 tablet 3   OLANZapine-Samidorphan 5-10 MG TABS Take by mouth.     Potassium 99 MG TABS Take  by mouth.     rosuvastatin (CRESTOR) 10 MG tablet Take 1 tablet (10 mg total) by mouth daily. 90 tablet 1   VESICARE 10 MG tablet Take 10 mg by mouth daily.  3   No current facility-administered medications on file prior to visit.    Past Medical History:  Diagnosis Date   ADHD (attention deficit hyperactivity disorder)    Bipolar 1 disorder ([facility])    History of alcohol abuse    History of drug abuse ([place])    Interstitial cystitis     Past Surgical History:  Procedure Laterality Date   ADENOIDECTOMY  1971   APPENDECTOMY     SALPINGECTOMY  1993   vaginal surgery      Social History   Socioeconomic History   Marital status: Single    Spouse name: Not on file   Number of children: 1   Years of education: Not on file   Highest education level: Not on file  Occupational History    Employer: [employer]  Tobacco Use   Smoking status: Every Day    Packs/day: 0.50    Pack years: 0.00    Types: Cigarettes   Smokeless tobacco: Never  Substance and Sexual Activity   Alcohol use: No    Alcohol/week: 0.0 standard drinks   Drug use: No    Comment: h/o abuse hasn't used since  2002   Sexual activity: Not Currently    Partners: Male    Birth control/protection: None  Other Topics Concern   Not on file  Social History Narrative   Exercise:  Walks a lot, tries to [place]    Social Determinants of Health   Financial Resource Strain: Not on file  Food Insecurity: Not on file  Transportation Needs: Not on file  Physical Activity: Not on file  Stress: Not on file  Social Connections: Not on file    Family History  Problem Relation Age of Onset   Arthritis Mother    Hyperlipidemia Mother    Heart disease Mother    Stroke Mother    Hypertension Mother    Diabetes Mother    Mental illness Mother    Alcohol abuse Father    Hypertension Other    Diabetes Other    Mental illness Other    Cancer Other    Cancer Sister 49       small intestine    Alcoholism  Other     Review of Systems  Constitutional:  Negative for fever.  Respiratory:  Negative for cough, shortness of breath and wheezing.   Cardiovascular:  Positive for chest pain (anxiety related). Negative for palpitations and leg swelling.  Neurological:  Positive for headaches (occ). Negative for light-headedness.      Objective:   Vitals:   09/11/20 0906  BP: 140/78  Pulse: 60  Temp: 98 F (36.7 C)  SpO2: 98%   BP Readings from Last 3 Encounters:  09/11/20 140/78  02/28/20 (!) 148/84  08/23/19 (!) 148/80   Wt Readings from Last 3 Encounters:  09/11/20 195 lb (88.5 kg)  02/28/20 203 lb 3.2 oz (92.2 kg)  08/23/19 206 lb 12.8 oz (93.8 kg)   Body mass index is 34.54 kg/m.   Physical Exam    Constitutional: Appears well-developed and well-nourished. No distress.  HENT:  Head: Normocephalic and atraumatic.  Neck: Neck supple. No tracheal deviation present. No thyromegaly present.  No cervical lymphadenopathy Cardiovascular: Normal rate, regular rhythm and normal heart sounds.   2/6 sys murmur heard, more pronounced at RSB. No carotid bruit .  No edema Pulmonary/Chest: Effort normal and breath sounds normal. No respiratory distress. No has no wheezes. No rales.  Skin: Skin is warm and dry. Not diaphoretic.  Psychiatric: Normal mood and affect. Behavior is normal.      Assessment & Plan:    See Problem List for Assessment and Plan of chronic medical problems.    This visit occurred during the SARS-CoV-2 public health emergency.  Safety protocols were in place, including screening questions prior to the visit, additional usage of staff PPE, and extensive cleaning of exam room while observing appropriate contact time as indicated for disinfecting solutions.

## 2020-09-10 NOTE — Patient Instructions (Addendum)
  Blood work was ordered.     Medications changes include :   none    An echocardiogram was ordered.     Please followup in 6 months

## 2020-09-11 ENCOUNTER — Ambulatory Visit (INDEPENDENT_AMBULATORY_CARE_PROVIDER_SITE_OTHER): Payer: BC Managed Care – PPO | Admitting: Internal Medicine

## 2020-09-11 ENCOUNTER — Encounter: Payer: Self-pay | Admitting: Internal Medicine

## 2020-09-11 ENCOUNTER — Other Ambulatory Visit: Payer: Self-pay

## 2020-09-11 VITALS — BP 140/78 | HR 60 | Temp 98.0°F | Ht 63.0 in | Wt 195.0 lb

## 2020-09-11 DIAGNOSIS — F17210 Nicotine dependence, cigarettes, uncomplicated: Secondary | ICD-10-CM

## 2020-09-11 DIAGNOSIS — M19071 Primary osteoarthritis, right ankle and foot: Secondary | ICD-10-CM

## 2020-09-11 DIAGNOSIS — E7849 Other hyperlipidemia: Secondary | ICD-10-CM | POA: Diagnosis not present

## 2020-09-11 DIAGNOSIS — I10 Essential (primary) hypertension: Secondary | ICD-10-CM | POA: Diagnosis not present

## 2020-09-11 DIAGNOSIS — R7303 Prediabetes: Secondary | ICD-10-CM | POA: Diagnosis not present

## 2020-09-11 DIAGNOSIS — F319 Bipolar disorder, unspecified: Secondary | ICD-10-CM

## 2020-09-11 DIAGNOSIS — Z1159 Encounter for screening for other viral diseases: Secondary | ICD-10-CM

## 2020-09-11 DIAGNOSIS — M19072 Primary osteoarthritis, left ankle and foot: Secondary | ICD-10-CM

## 2020-09-11 DIAGNOSIS — E669 Obesity, unspecified: Secondary | ICD-10-CM

## 2020-09-11 DIAGNOSIS — R011 Cardiac murmur, unspecified: Secondary | ICD-10-CM | POA: Insufficient documentation

## 2020-09-11 LAB — COMPREHENSIVE METABOLIC PANEL
ALT: 16 U/L (ref 0–35)
AST: 23 U/L (ref 0–37)
Albumin: 4.5 g/dL (ref 3.5–5.2)
Alkaline Phosphatase: 86 U/L (ref 39–117)
BUN: 17 mg/dL (ref 6–23)
CO2: 31 mEq/L (ref 19–32)
Calcium: 9.4 mg/dL (ref 8.4–10.5)
Chloride: 99 mEq/L (ref 96–112)
Creatinine, Ser: 1.14 mg/dL (ref 0.40–1.20)
GFR: 54.08 mL/min — ABNORMAL LOW (ref >60.00)
Glucose, Bld: 113 mg/dL — ABNORMAL HIGH (ref 70–99)
Potassium: 3.5 mEq/L (ref 3.5–5.1)
Sodium: 136 mEq/L (ref 135–145)
Total Bilirubin: 0.6 mg/dL (ref 0.2–1.2)
Total Protein: 8.4 g/dL — ABNORMAL HIGH (ref 6.0–8.3)

## 2020-09-11 LAB — HEMOGLOBIN A1C: Hgb A1c MFr Bld: 5.9 % (ref 4.6–6.5)

## 2020-09-11 LAB — LIPID PANEL
Cholesterol: 153 mg/dL (ref 0–200)
HDL: 44.6 mg/dL (ref >39.00)
LDL Cholesterol: 81 mg/dL (ref 0–99)
NonHDL: 108.55
Total CHOL/HDL Ratio: 3
Triglycerides: 140 mg/dL (ref 0.0–149.0)
VLDL: 28 mg/dL (ref 0.0–40.0)

## 2020-09-11 NOTE — Assessment & Plan Note (Signed)
Chronic Check lipid panel  Continue crestor 10 mg qd Regular exercise and healthy diet encouraged

## 2020-09-11 NOTE — Assessment & Plan Note (Signed)
Chronic Check a1c Low sugar / carb diet Stressed regular exercise  

## 2020-09-11 NOTE — Assessment & Plan Note (Signed)
Chronic management per pscy

## 2020-09-11 NOTE — Assessment & Plan Note (Addendum)
Chronic Will order echo to evaluate - asymptomatic  Need to keep BP well controlled Encouraged weight loss

## 2020-09-11 NOTE — Assessment & Plan Note (Addendum)
Chronic Continue meloxicam 7.5 mg qd prn Discussed risks, specifically negative impact on kidneys, stomach and heart.  We both agree the benefits likely outweigh the risks and we will monitor her symptoms closely

## 2020-09-11 NOTE — Assessment & Plan Note (Signed)
Chronic BP controlled Continue amlodipine 5 mg qd, hctz 50 mg qd, bystolic 10 mg qd cmp

## 2020-09-11 NOTE — Assessment & Plan Note (Signed)
Chronic She has lost some weight and encouraged her to continue her efforts to lose weight Healthy diet, decrease portions and regular exercise encouraged

## 2020-09-14 LAB — HEPATITIS C ANTIBODY
Hepatitis C Ab: NONREACTIVE
SIGNAL TO CUT-OFF: 0.01 (ref <1.00)

## 2020-10-16 ENCOUNTER — Other Ambulatory Visit: Payer: Self-pay

## 2020-10-16 ENCOUNTER — Ambulatory Visit (HOSPITAL_COMMUNITY): Payer: BC Managed Care – PPO | Attending: Cardiovascular Disease

## 2020-10-16 DIAGNOSIS — R011 Cardiac murmur, unspecified: Secondary | ICD-10-CM | POA: Diagnosis present

## 2020-10-16 LAB — ECHOCARDIOGRAM COMPLETE
AV Mean grad: 16 mmHg
AV Peak grad: 28.1 mmHg
Ao pk vel: 2.65 m/s
Area-P 1/2: 1.63 cm2
S' Lateral: 2.3 cm

## 2020-10-17 ENCOUNTER — Other Ambulatory Visit: Payer: Self-pay | Admitting: Internal Medicine

## 2020-10-17 DIAGNOSIS — I421 Obstructive hypertrophic cardiomyopathy: Secondary | ICD-10-CM | POA: Insufficient documentation

## 2020-11-03 ENCOUNTER — Other Ambulatory Visit: Payer: Self-pay | Admitting: Internal Medicine

## 2020-11-10 ENCOUNTER — Other Ambulatory Visit: Payer: Self-pay | Admitting: Internal Medicine

## 2020-11-27 ENCOUNTER — Other Ambulatory Visit (HOSPITAL_COMMUNITY)
Admission: RE | Admit: 2020-11-27 | Discharge: 2020-11-27 | Disposition: A | Discharge status: Home / Self Care | Payer: BC Managed Care – PPO | Source: Ambulatory Visit | Attending: Obstetrics | Admitting: Obstetrics

## 2020-11-27 ENCOUNTER — Encounter: Payer: Self-pay | Admitting: Obstetrics

## 2020-11-27 ENCOUNTER — Ambulatory Visit (INDEPENDENT_AMBULATORY_CARE_PROVIDER_SITE_OTHER): Payer: BC Managed Care – PPO | Admitting: Obstetrics

## 2020-11-27 ENCOUNTER — Other Ambulatory Visit: Payer: Self-pay

## 2020-11-27 VITALS — BP 137/80 | HR 54 | Wt 195.6 lb

## 2020-11-27 DIAGNOSIS — Z01419 Encounter for gynecological examination (general) (routine) without abnormal findings: Secondary | ICD-10-CM

## 2020-11-27 DIAGNOSIS — E669 Obesity, unspecified: Secondary | ICD-10-CM

## 2020-11-27 DIAGNOSIS — F17210 Nicotine dependence, cigarettes, uncomplicated: Secondary | ICD-10-CM | POA: Diagnosis not present

## 2020-11-27 DIAGNOSIS — Z780 Asymptomatic menopausal state: Secondary | ICD-10-CM | POA: Diagnosis not present

## 2020-11-27 NOTE — Progress Notes (Signed)
Subjective:        Kylie Wright is a 55 y.o. female here for a routine exam.  Current complaints: Npne .    Personal health questionnaire:  Is patient [name], have a family history of breast and/or ovarian cancer: no Is there a family history of uterine cancer diagnosed at age < 53, gastrointestinal cancer, urinary tract cancer, family member who is a Field seismologist syndrome-associated carrier: no Is the patient overweight and hypertensive, family history of diabetes, personal history of gestational diabetes, preeclampsia or PCOS: no Is patient over 78, have PCOS,  family history of premature CHD under age 21, diabetes, smoke, have hypertension or peripheral artery disease:  no At any time, has a partner hit, kicked or otherwise hurt or frightened you?: no Over the past 2 weeks, have you felt down, depressed or hopeless?: no Over the past 2 weeks, have you felt little interest or pleasure in doing things?:no   Gynecologic History No LMP recorded. (Menstrual status: Perimenopausal). Contraception: post menopausal status Last Pap: 2019. Results were: normal Last mammogram: 2022. Results were: normal  Obstetric History OB History  Gravida Para Term Preterm AB Living  '2 1 1   1 1  '$ SAB IAB Ectopic Multiple Live Births      1   1    # Outcome Date GA Lbr Len/2nd Weight Sex Delivery Anes PTL Lv  2 Term 2003 [redacted]w[redacted]d  M Vag-Spont EPI N LIV  1 Ectopic 1993            Past Medical History:  Diagnosis Date   ADHD (attention deficit hyperactivity disorder)    Bipolar 1 disorder ([place]    History of alcohol abuse    History of drug abuse ([facility]    Interstitial cystitis     Past Surgical History:  Procedure Laterality Date   ADENOIDECTOMY  1971   APPENDECTOMY     SALPINGECTOMY  1993   vaginal surgery       Current Outpatient Medications:    amphetamine-dextroamphetamine (ADDERALL XR) 20 MG 24 hr capsule, Take 20 mg by mouth every morning., Disp: , Rfl: 0   azelastine (OPTIVAR)  0.05 % ophthalmic solution, Place 1 drop into both eyes 2 (two) times daily., Disp: 6 mL, Rfl: 12   hydrochlorothiazide (HYDRODIURIL) 50 MG tablet, Take 1 tablet (50 mg total) by mouth every morning., Disp: 90 tablet, Rfl: 1   loratadine (CLARITIN) 10 MG tablet, Take 10 mg by mouth daily as needed for allergies., Disp: , Rfl:    Melatonin 10 MG TABS, Take by mouth., Disp: , Rfl:    meloxicam (MOBIC) 7.5 MG tablet, TAKE 1 TABLET BY MOUTH TWICE A DAY AS NEEDED PAIN, Disp: 180 tablet, Rfl: 1   nebivolol (BYSTOLIC) 10 MG tablet, Take 1 tablet (10 mg total) by mouth daily., Disp: 90 tablet, Rfl: 3   OLANZapine-Samidorphan 5-10 MG TABS, Take by mouth., Disp: , Rfl:    Potassium 99 MG TABS, Take by mouth., Disp: , Rfl:    rosuvastatin (CRESTOR) 10 MG tablet, TAKE 1 TABLET (10 MG TOTAL) BY MOUTH DAILY., Disp: 30 tablet, Rfl: 2   VESICARE 10 MG tablet, Take 10 mg by mouth daily., Disp: , Rfl: 3   amLODipine (NORVASC) 5 MG tablet, TAKE 1 TABLET BY MOUTH DAILY, Disp: 30 tablet, Rfl: 2 No Known Allergies  Social History   Tobacco Use   Smoking status: Every Day    Packs/day: 0.50    Types: Cigarettes   Smokeless  tobacco: Never  Substance Use Topics   Alcohol use: No    Alcohol/week: 0.0 standard drinks    Family History  Problem Relation Age of Onset   Arthritis Mother    Hyperlipidemia Mother    Heart disease Mother    Stroke Mother    Hypertension Mother    Diabetes Mother    Mental illness Mother    Alcohol abuse Father    Hypertension Other    Diabetes Other    Mental illness Other    Cancer Other    Cancer Sister 57       small intestine    Alcoholism Other       Review of Systems  Constitutional: negative for fatigue and weight loss Respiratory: negative for cough and wheezing Cardiovascular: negative for chest pain, fatigue and palpitations Gastrointestinal: negative for abdominal pain and change in bowel habits Musculoskeletal:negative for myalgias Neurological: negative  for gait problems and tremors Behavioral/Psych: negative for abusive relationship, depression Endocrine: negative for temperature intolerance    Genitourinary:negative for abnormal menstrual periods, genital lesions, hot flashes, sexual problems and vaginal discharge Integument/breast: negative for breast lump, breast tenderness, nipple discharge and skin lesion(s)    Objective:       BP 137/80   Pulse (!) 54   Wt 195 lb 9.6 oz (88.7 kg)   BMI 34.65 kg/m  General:   Alert and no distress  Skin:   no rash or abnormalities  Lungs:   clear to auscultation bilaterally  Heart:   regular rate and rhythm, S1, S2 normal, no murmur, click, rub or gallop  Breasts:   normal without suspicious masses, skin or nipple changes or axillary nodes  Abdomen:  normal findings: no organomegaly, soft, non-tender and no hernia  Pelvis:  External genitalia: normal general appearance Urinary system: urethral meatus normal and bladder without fullness, nontender Vaginal: normal without tenderness, induration or masses Cervix: normal appearance Adnexa: normal bimanual exam Uterus: anteverted and non-tender, normal size   Lab Review Urine pregnancy test Labs reviewed yes Radiologic studies reviewed yes  I have spent a total of 20 minutes of face-to-face time, excluding clinical staff time, reviewing notes and preparing to see patient, ordering tests and/or medications, and counseling the patient.   Assessment:    1. Encounter for routine gynecological examination with Papanicolaou smear of cervix  2. Postmenopause - doing well  3. Obesity (BMI 30.0-34.9) - weight reduction with the aid of dietary changes, exercise and behavioral modification recommended  4. Tobacco dependence due to cigarettes - cessation with the aid of medication and behavioral modification recommended     Plan:    Education reviewed: calcium supplements, depression evaluation, low fat, low cholesterol diet, safe sex/STD  prevention, self breast exams, skin cancer screening, smoking cessation, and weight bearing exercise. Follow up in: 1 year.     Shelly Bombard, MD 11/27/2020 10:17 AM

## 2020-11-27 NOTE — Addendum Note (Signed)
Addended by: Lucianne Lei on: 11/27/2020 12:12 PM   Modules accepted: Orders

## 2020-11-27 NOTE — Progress Notes (Signed)
Last pap - ([facility]) Mammo- 06/12/2020-normal

## 2020-12-03 LAB — CYTOLOGY - PAP
Comment: NEGATIVE
Diagnosis: NEGATIVE
High risk HPV: NEGATIVE

## 2020-12-18 ENCOUNTER — Ambulatory Visit: Payer: BC Managed Care – PPO | Admitting: Cardiovascular Disease

## 2021-01-21 NOTE — Progress Notes (Signed)
Cardiology Office Note:    Date:  01/21/2021   ID:  Kylie Wright, DOB [birth year], MRN [id number]  PCP:  Binnie Rail, MD   [facility] Providers Cardiologist:  None     Referring MD: Binnie Rail, MD   No chief complaint on file. HOCM  History of Present Illness:    Kylie Wright is a 55 y.o. female with a hx of bipolar, PTSD, drug abuse, ADHD, cataracts, referral for HOCM  She saw her PCP who heard a murmur on exam 09/11/2020 and echo showed asymmetric basal septal hypertrophy, outflow tract obstruction, late peaking CW doppler. Peak gradient at rest noted to be 39 mmHg, SAM with possible subaortic membrane. She denies chest pain, sob. She has  LH  and dizziness. No palpitations. In 2016, she was presyncopal and she fell and lost consciousness.Marland Kitchen She was at an 8 am AA meeting, she was talking to someone in the parking lot. She felt like she was going to faint. She had another episode and she felt faint before a blood draw.She did not syncopize. She went to the ER and diagnosed with vertigo. She notes some chest pain and feels it is linked to anxiety with overwhelmed. It stays in the center of her chest and stays a long time.  She says happens 20-45 minutes, happens every 3-4 months.  She is an active smoking. Notes some sob with going uphill. 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 This can happen with driving. Her mother had heart dx. Father no heart diease. She has two sisters. They have hypertension She has no family members with SCD.   Past Medical History:  Diagnosis Date   ADHD (attention deficit hyperactivity disorder)    Bipolar 1 disorder ([place])    History of alcohol abuse    History of drug abuse ([place])    Interstitial cystitis     Past Surgical History:  Procedure Laterality Date   ADENOIDECTOMY  1971   APPENDECTOMY     SALPINGECTOMY  1993   vaginal surgery      Current Medications: No outpatient medications have been  marked as taking for the 01/22/21 encounter (Appointment) with Janina Mayo, MD.     Allergies:   Patient has no known allergies.   Social History   Socioeconomic History   Marital status: Single    Spouse name: Not on file   Number of children: 1   Years of education: Not on file   Highest education level: Not on file  Occupational History    Employer: [employer]  Tobacco Use   Smoking status: Every Day    Packs/day: 0.50    Types: Cigarettes   Smokeless tobacco: Never  Substance and Sexual Activity   Alcohol use: No    Alcohol/week: 0.0 standard drinks   Drug use: No    Comment: h/o abuse hasn't used since 2002   Sexual activity: Not Currently    Partners: Male    Birth control/protection: None  Other Topics Concern   Not on file  Social History Narrative   Exercise:  Walks a lot, tries to [place]    Social Determinants of Health   Financial Resource Strain: Not on file  Food Insecurity: Not on file  Transportation Needs: Not on file  Physical Activity: Not on file  Stress: Not on file  Social Connections: Not on file     Family History: The patient's family  history includes Alcohol abuse in her father; Alcoholism in an other family member; Arthritis in her mother; Cancer in an other family member; Cancer (age of onset: 82) in her sister; Diabetes in her mother and another family member; Heart disease in her mother; Hyperlipidemia in her mother; Hypertension in her mother and another family member; Mental illness in her mother and another family member; Stroke in her mother.  ROS:   Please see the history of present illness.     All other systems reviewed and are negative.  EKGs/Labs/Other Studies Reviewed:    The following studies were reviewed today:   EKG:  EKG is  ordered today.  The ekg ordered today demonstrates  Sinus bradycardia , LAE  Recent Labs: 02/28/2020: Hemoglobin 13.8; Platelets 189.0 09/11/2020: ALT 16; BUN 17; Creatinine, Ser  1.14; Potassium 3.5; Sodium 136  Recent Lipid Panel    Component Value Date/Time   CHOL 153 09/11/2020 1004   TRIG 140.0 09/11/2020 1004   HDL 44.60 09/11/2020 1004   CHOLHDL 3 09/11/2020 1004   VLDL 28.0 09/11/2020 1004   LDLCALC 81 09/11/2020 1004     Risk Assessment/Calculations:           Physical Exam:    VS:    Vitals:   01/22/21 0854  BP: 128/74  Pulse: (!) 51  Resp: 20  SpO2: 98%     Wt Readings from Last 3 Encounters:  11/27/20 195 lb 9.6 oz (88.7 kg)  09/11/20 195 lb (88.5 kg)  02/28/20 203 lb 3.2 oz (92.2 kg)     GEN:  Well nourished, well developed in no acute distress HEENT: Normal NECK: No JVD; No carotid bruits LYMPHATICS: No lymphadenopathy CARDIAC: RRR, + SEM at the midsternum louder with valsalva, rubs, gallops RESPIRATORY:  Clear to auscultation without rales, wheezing or rhonchi  ABDOMEN: Soft, non-tender, non-distended MUSCULOSKELETAL:  No edema; No deformity  SKIN: Warm and dry NEUROLOGIC:  Alert and oriented x 3 PSYCHIATRIC:  Normal affect   ASSESSMENT:    #HOCM: Peak gradient 39 mmHg. No high risk features. Her syncopal episodes are consistent with vasovagal syncope with a prodrome. She has no hx of sudden cardiac arrest or sustained VT. Will plan for a cardiac MRI to look for scar % for risk of SCD.  Will start verapamil and stop norvasc. I have no reservations for cataract surgery she may need; recommend hydration.  PLAN:    In order of problems listed above:  Cardiac MRI w/ and w/o contrast rest Cardiac event monitor 7 days Stop norvasc Start verapamil 120 ER  Handed out information to review Follow up 3 months       Medication Adjustments/Labs and Tests Ordered: Current medicines are reviewed at length with the patient today.  Concerns regarding medicines are outlined above.   Signed, Janina Mayo, MD  01/21/2021 8:50 PM    [facility]

## 2021-01-22 ENCOUNTER — Ambulatory Visit: Payer: BC Managed Care – PPO | Admitting: Internal Medicine

## 2021-01-22 ENCOUNTER — Encounter: Payer: Self-pay | Admitting: Internal Medicine

## 2021-01-22 ENCOUNTER — Other Ambulatory Visit: Payer: Self-pay

## 2021-01-22 ENCOUNTER — Ambulatory Visit (INDEPENDENT_AMBULATORY_CARE_PROVIDER_SITE_OTHER): Payer: BC Managed Care – PPO

## 2021-01-22 VITALS — BP 128/74 | HR 51 | Resp 20 | Ht 62.0 in | Wt 209.2 lb

## 2021-01-22 DIAGNOSIS — I422 Other hypertrophic cardiomyopathy: Secondary | ICD-10-CM

## 2021-01-22 DIAGNOSIS — Z01812 Encounter for preprocedural laboratory examination: Secondary | ICD-10-CM

## 2021-01-22 LAB — CBC
Hematocrit: 38.2 % (ref 34.0–46.6)
Hemoglobin: 13.2 g/dL (ref 11.1–15.9)
MCH: 32.2 pg (ref 26.6–33.0)
MCHC: 34.6 g/dL (ref 31.5–35.7)
MCV: 93 fL (ref 79–97)
Platelets: 194 10*3/uL (ref 150–450)
RBC: 4.1 x10E6/uL (ref 3.77–5.28)
RDW: 13.2 % (ref 11.7–15.4)
WBC: 12.8 10*3/uL — ABNORMAL HIGH (ref 3.4–10.8)

## 2021-01-22 LAB — BASIC METABOLIC PANEL
BUN/Creatinine Ratio: 11 (ref 9–23)
BUN: 14 mg/dL (ref 6–24)
CO2: 24 mmol/L (ref 20–29)
Calcium: 8.6 mg/dL — ABNORMAL LOW (ref 8.7–10.2)
Chloride: 100 mmol/L (ref 96–106)
Creatinine, Ser: 1.27 mg/dL — ABNORMAL HIGH (ref 0.57–1.00)
Glucose: 91 mg/dL (ref 70–99)
Potassium: 3.5 mmol/L (ref 3.5–5.2)
Sodium: 138 mmol/L (ref 134–144)
eGFR: 50 mL/min/{1.73_m2} — ABNORMAL LOW (ref >59)

## 2021-01-22 MED ORDER — VERAPAMIL HCL ER 120 MG PO TBCR: 120.0000 mg | EXTENDED_RELEASE_TABLET | Freq: Every day | ORAL | 3 refills | Status: DC

## 2021-01-22 NOTE — Patient Instructions (Addendum)
Medication Instructions:  STOP amlodipine START verapamil 120 mg once daily at bedtime  *If you need a refill on your cardiac medications before your next appointment, please call your pharmacy*   Lab Work: BMET, CBC today  If you have labs (blood work) drawn today and your tests are completely normal, you will receive your results only by: [place] (if you have MyChart) OR A paper copy in the mail If you have any lab test that is abnormal or we need to change your treatment, we will call you to review the results.   Testing/Procedures: Your physician has requested that you have a cardiac MRI. Cardiac MRI uses a computer to create images of your heart as its beating, producing both still and moving pictures of your heart and major blood vessels. For further information please visit [email or web address] Please follow the instruction sheet given to you today for more information.  ZIO XT- Long Term Monitor Instructions   Your physician has requested you wear a ZIO patch monitor for _7__ days.  This is a single patch monitor.   [employer] supplies one patch monitor per enrollment. Additional stickers are not available. Please do not apply patch if you will be having a Nuclear Stress Test, Echocardiogram, Cardiac CT, MRI, or Chest Xray during the period you would be wearing the monitor. The patch cannot be worn during these tests. You cannot remove and re-apply the ZIO XT patch monitor.  Your ZIO patch monitor will be sent Fed Ex from [employer] directly to your home address. It may take 3-5 days to receive your monitor after you have been enrolled.  Once you have received your monitor, please review the enclosed instructions. Your monitor has already been registered assigning a specific monitor serial # to you.  Billing and Patient Assistance Program Information   We have supplied [employer] with any of your insurance information on file for billing purposes. [employer] offers a  sliding scale Patient Assistance Program for patients that do not have insurance, or whose insurance does not completely cover the cost of the ZIO monitor.   You must apply for the Patient Assistance Program to qualify for this discounted rate.     To apply, please call [employer] at [phone], select option 4, then select option 2, and ask to apply for Patient Assistance Program.  Theodore Demark will ask your household income, and how many people are in your household.  They will quote your out-of-pocket cost based on that information.  [employer] will also be able to set up a 61-month, interest-free payment plan if needed.  Applying the monitor   Shave hair from upper left chest.  Hold abrader disc by orange tab. Rub abrader in 40 strokes over the upper left chest as indicated in your monitor instructions.  Clean area with 4 enclosed alcohol pads. Let dry.  Apply patch as indicated in monitor instructions. Patch will be placed under collarbone on left side of chest with arrow pointing upward.  Rub patch adhesive wings for 2 minutes. Remove white label marked "1". Remove the white label marked "2". Rub patch adhesive wings for 2 additional minutes.  While looking in a mirror, press and release button in center of patch. A small green light will flash 3-4 times. This will be your only indicator that the monitor has been turned on. ?  Do not shower for the first 24 hours. You may shower after the first 24 hours.  Press the button if you feel a symptom. You will  hear a small click. Record Date, Time and Symptom in the Patient Logbook.  When you are ready to remove the patch, follow instructions on the last 2 pages of the Patient Logbook. Stick patch monitor onto the last page of Patient Logbook.  Place Patient Logbook in the blue and white box.  Use locking tab on box and tape box closed securely.  The blue and white box has prepaid postage on it. Please place it in the mailbox as soon as possible. Your physician  should have your test results approximately 7 days after the monitor has been mailed back to [facility].  Call [place] at [phone] if you have questions regarding your ZIO XT patch monitor. Call them immediately if you see an orange light blinking on your monitor.  If your monitor falls off in less than 4 days, contact our Monitor department at [phone]. ?If your monitor becomes loose or falls off after 4 days call [employer] at [phone] for suggestions on securing your monitor.?  Follow-Up: At [facility], you and your health needs are our priority.  As part of our continuing mission to provide you with exceptional heart care, we have created designated Provider Care Teams.  These Care Teams include your primary Cardiologist (physician) and Advanced Practice Providers (APPs -  Physician Assistants and Nurse Practitioners) who all work together to provide you with the care you need, when you need it.  We recommend signing up for the patient portal called "MyChart".  Sign up information is provided on this After Visit Summary.  MyChart is used to connect with patients for Virtual Visits (Telemedicine).  Patients are able to view lab/test results, encounter notes, upcoming appointments, etc.  Non-urgent messages can be sent to your provider as well.   To learn more about what you can do with MyChart, go to [email or web address].    Your next appointment:   3 month(s)  The format for your next appointment:   In Person  Provider:   Dr. Harl Bowie

## 2021-01-22 NOTE — Progress Notes (Unsigned)
Enrolled patient for a 7 day Zio XT monitor to be mailed to patients home.  

## 2021-01-26 DIAGNOSIS — I422 Other hypertrophic cardiomyopathy: Secondary | ICD-10-CM

## 2021-01-26 DIAGNOSIS — R55 Syncope and collapse: Secondary | ICD-10-CM | POA: Diagnosis not present

## 2021-02-08 ENCOUNTER — Other Ambulatory Visit: Payer: Self-pay | Admitting: Internal Medicine

## 2021-02-10 ENCOUNTER — Telehealth: Payer: Self-pay | Admitting: Internal Medicine

## 2021-02-10 DIAGNOSIS — I422 Other hypertrophic cardiomyopathy: Secondary | ICD-10-CM

## 2021-02-10 DIAGNOSIS — R001 Bradycardia, unspecified: Secondary | ICD-10-CM

## 2021-02-10 NOTE — Telephone Encounter (Signed)
Pt is returning call for results 

## 2021-02-10 NOTE — Telephone Encounter (Signed)
Chriss Driver, RN  02/10/2021  8:51 AM EST     Attempted to call patient, left message for patient to call back to office.    Janina Mayo, MD  02/09/2021  6:55 PM EST     Hi Eliezer Lofts, please let Mrs. Dell know that her even monitor showed slow heart rhythms. I am going to have her a due an exercise tolerance test to see if she has an increase in her heart rates. However, with her symptoms I will refer her to electrophysiology.   Please order her ETT and referral to EP after her MRI and ETT. The MRI date is 02/26/2021.   Thank you!    Spoke with patient about monitor results and MD recommendations. She agreed w/plan.   ETT ordered - message to scheduling - MD to sign attestation   EP referral ordered - message to Gracy Bruins (patient wants appt before end of year d/t deductible)

## 2021-02-17 ENCOUNTER — Other Ambulatory Visit: Payer: Self-pay | Admitting: Internal Medicine

## 2021-02-25 ENCOUNTER — Telehealth (HOSPITAL_COMMUNITY): Payer: Self-pay | Admitting: *Deleted

## 2021-02-25 ENCOUNTER — Telehealth: Payer: Self-pay | Admitting: Internal Medicine

## 2021-02-25 NOTE — Telephone Encounter (Signed)
Patient called to say that she can't afford to do the MRI or the stress test, it will cost her out of pocket $2,000.  She wants to do what Dr. Harl Bowie recommends, but she can't go into medical debt.

## 2021-02-25 NOTE — Telephone Encounter (Signed)
Close encounter 

## 2021-02-25 NOTE — Telephone Encounter (Signed)
Reaching out to patient to offer assistance regarding upcoming cardiac imaging study; pt verbalizes understanding of appt date/time, parking situation and where to check in, and verified current allergies; name and call back number provided for further questions should they arise  Gordy Clement RN [place] [facility] [phone] office [phone] cell  Patient denies metal or claustrophobia.

## 2021-02-26 ENCOUNTER — Other Ambulatory Visit: Payer: Self-pay

## 2021-02-26 ENCOUNTER — Ambulatory Visit (HOSPITAL_COMMUNITY): Admission: RE | Admit: 2021-02-26 | Payer: BC Managed Care – PPO | Source: Ambulatory Visit

## 2021-02-26 ENCOUNTER — Ambulatory Visit (HOSPITAL_COMMUNITY)
Admission: RE | Admit: 2021-02-26 | Discharge: 2021-02-26 | Disposition: A | Discharge status: Home / Self Care | Payer: BC Managed Care – PPO | Source: Ambulatory Visit | Attending: Cardiovascular Disease | Admitting: Cardiovascular Disease

## 2021-02-26 DIAGNOSIS — I422 Other hypertrophic cardiomyopathy: Secondary | ICD-10-CM | POA: Diagnosis present

## 2021-02-26 DIAGNOSIS — R001 Bradycardia, unspecified: Secondary | ICD-10-CM | POA: Diagnosis present

## 2021-02-26 LAB — EXERCISE TOLERANCE TEST
Estimated workload: 4.9
Exercise duration (min): 3 min
Exercise duration (sec): 15 s
MPHR: 164 {beats}/min
Peak HR: 89 {beats}/min
Percent HR: 54 %
Rest HR: 52 {beats}/min
ST Depression (mm): 0 mm

## 2021-03-11 ENCOUNTER — Other Ambulatory Visit: Payer: Self-pay | Admitting: Internal Medicine

## 2021-03-16 DIAGNOSIS — R001 Bradycardia, unspecified: Secondary | ICD-10-CM | POA: Insufficient documentation

## 2021-03-19 ENCOUNTER — Other Ambulatory Visit: Payer: Self-pay

## 2021-03-19 ENCOUNTER — Ambulatory Visit: Payer: BC Managed Care – PPO | Admitting: Internal Medicine

## 2021-03-19 ENCOUNTER — Encounter: Payer: Self-pay | Admitting: Internal Medicine

## 2021-03-19 VITALS — BP 136/74 | HR 45 | Ht 62.0 in | Wt 193.0 lb

## 2021-03-19 DIAGNOSIS — I421 Obstructive hypertrophic cardiomyopathy: Secondary | ICD-10-CM | POA: Diagnosis not present

## 2021-03-19 DIAGNOSIS — R001 Bradycardia, unspecified: Secondary | ICD-10-CM | POA: Diagnosis not present

## 2021-03-19 NOTE — Patient Instructions (Addendum)
Medication Instructions:  Your physician has recommended you make the following change in your medication:   ** Stop Verapamil  *If you need a refill on your cardiac medications before your next appointment, please call your pharmacy*   Lab Work: None ordered.  If you have labs (blood work) drawn today and your tests are completely normal, you will receive your results only by: [place] (if you have MyChart) OR A paper copy in the mail If you have any lab test that is abnormal or we need to change your treatment, we will call you to review the results.   Testing/Procedures: Referral to Dr Lattie Corns, Genetics   Follow-Up: At [facility], you and your health needs are our priority.  As part of our continuing mission to provide you with exceptional heart care, we have created designated Provider Care Teams.  These Care Teams include your primary Cardiologist (physician) and Advanced Practice Providers (APPs -  Physician Assistants and Nurse Practitioners) who all work together to provide you with the care you need, when you need it.  We recommend signing up for the patient portal called "MyChart".  Sign up information is provided on this After Visit Summary.  MyChart is used to connect with patients for Virtual Visits (Telemedicine).  Patients are able to view lab/test results, encounter notes, upcoming appointments, etc.  Non-urgent messages can be sent to your provider as well.   To learn more about what you can do with MyChart, go to [email or web address].    Your next appointment:   Follow up with Dr Caryl Comes only as needed. :1}    Other Instructions Please schedule appointment with Dr Gasper Sells

## 2021-03-19 NOTE — Progress Notes (Signed)
ELECTROPHYSIOLOGY CONSULT NOTE  Patient ID: Kylie Wright, MRN: [id number], DOB/AGE: Jul 25, 1965 55 y.o. Admit date: (Not on file) Date of Consult: 03/19/2021  Primary Physician: Binnie Rail, MD Primary Cardiologist: MBr     Kylie Wright is a 55 y.o. female who is being seen today for the evaluation of HCM at the request of Br MBr .    HPI Kylie Wright is a 55 y.o. female with a history of bipolar disorder PTSD ADHD "drug abuse "referred having been diagnosed with hypertrophic cardiomyopathy.  Treated with a combination of verapamil 120 and nebivolol 10.  Underwent GXT >> terminated in stage I with a peak heart rate of 86.  Blood pressure response may have been a decrease from a "preexercise sitting--155/78--->> exercise 139/84 "  Event recorder 11/22 was reviewed-VT-nonsustained x1 episode-4 beats average heart rate 155  DATE TEST EF   6//22 Echo   60-65 % LAE -mod ( 4.4cm/ 37 mm/m2) LVH 85/02 mm Systolic ant motion-- grad rest 39 mm              Date Cr K Hgb  11/22 1.27 3.5 13.2         She had one episode of syncope in 2016. She suddenly felt like she would pass out, and fell down to the pavement of a parking lot. She continues to have near-syncopal spells daily, even when driving to work. Usually, the spells are sudden with no prodrome. During her near-syncope, she "feels like everything is going to shut down in her upper body". This may last up to a few minutes. When her episodes resolve, she feels fearful but otherwise normal. She notes they also occur after she bends over and stands back up. She is not aware of any associated palpitations.     She is able to walk up to a mile before becoming short of breath if she walks at a slower pace.   She was started on Bystolic 2 years ago, this was increased 6 months ago. Amlodipine was discontinued and switched to verapamil. Since that change she is feeling better. She checks her BP at work, and notices that it fluctuates  between normal and high.   The patient denies chest pain, nocturnal dyspnea, orthopnea or peripheral edema.  There has been no lightheadedness.  Complains of dyslexia.  She has 2 living older sisters with heart issues, and 1 67 yo son (not screened for cardiomyopathy). Her oldest sister has hypertension. Her mother had stroke, heart attack, diabetes, and brain surgery with shunt. Her father had stomach cancer.   She is a smoker. She does not drink alcohol.  Past Medical History:  Diagnosis Date   ADHD (attention deficit hyperactivity disorder)    Bipolar 1 disorder ([place])    History of alcohol abuse    History of drug abuse ([place])    Interstitial cystitis       Surgical History:  Past Surgical History:  Procedure Laterality Date   ADENOIDECTOMY  1971   APPENDECTOMY     SALPINGECTOMY  1993   vaginal surgery       Home Meds: Current Meds  Medication Sig   amphetamine-dextroamphetamine (ADDERALL XR) 20 MG 24 hr capsule Take 20 mg by mouth every morning.   hydrochlorothiazide (HYDRODIURIL) 50 MG tablet Take 1 tablet (50 mg total) by mouth every morning.   latanoprost (XALATAN) 0.005 % ophthalmic solution Place 0.024 drops into both eyes at bedtime.   loratadine (CLARITIN) 10 MG tablet Take 10  mg by mouth daily as needed for allergies.   Melatonin 10 MG TABS Take by mouth.   meloxicam (MOBIC) 7.5 MG tablet TAKE 1 TABLET BY MOUTH TWICE A DAY AS NEEDED PAIN   nebivolol (BYSTOLIC) 10 MG tablet TAKE 1 TABLET BY MOUTH DAILY   OLANZapine-Samidorphan 5-10 MG TABS Take by mouth.   Potassium 99 MG TABS Take by mouth.   rosuvastatin (CRESTOR) 10 MG tablet TAKE 1 TABLET DAILY   timolol (TIMOPTIC) 0.5 % ophthalmic solution SMARTSIG:In Eye(s)   verapamil (CALAN-SR) 120 MG CR tablet Take 1 tablet (120 mg total) by mouth at bedtime.   VESICARE 10 MG tablet Take 10 mg by mouth daily.    Allergies: No Known Allergies  Social History   Socioeconomic History   Marital status: Single    Spouse  name: Not on file   Number of children: 1   Years of education: Not on file   Highest education level: Not on file  Occupational History    Employer: [employer]  Tobacco Use   Smoking status: Every Day    Packs/day: 0.50    Types: Cigarettes   Smokeless tobacco: Never  Substance and Sexual Activity   Alcohol use: No    Alcohol/week: 0.0 standard drinks   Drug use: No    Comment: h/o abuse hasn't used since 2002   Sexual activity: Not Currently    Partners: Male    Birth control/protection: None  Other Topics Concern   Not on file  Social History Narrative   Exercise:  Walks a lot, tries to [place]    Social Determinants of Health   Financial Resource Strain: Not on file  Food Insecurity: Not on file  Transportation Needs: Not on file  Physical Activity: Not on file  Stress: Not on file  Social Connections: Not on file  Intimate Partner Violence: Not on file     Family History  Problem Relation Age of Onset   Arthritis Mother    Hyperlipidemia Mother    Heart disease Mother    Stroke Mother    Hypertension Mother    Diabetes Mother    Mental illness Mother    Alcohol abuse Father    Hypertension Other    Diabetes Other    Mental illness Other    Cancer Other    Cancer Sister 25       small intestine    Alcoholism Other      ROS:  Please see the history of present illness.  All other systems reviewed and negative.    Physical Exam: Blood pressure 136/74, pulse (!) 45, height 5\' 2"  (1.575 m), weight 193 lb (87.5 kg), SpO2 98 %. General: Well developed, well nourished female in no acute distress. Wright: Normocephalic, atraumatic, sclera non-icteric, no xanthomas, nares are without discharge. EENT: normal  Lymph Nodes:  none Neck: Negative for carotid bruits. JVD not elevated. Back:without scoliosis kyphosis Lungs: Clear bilaterally to auscultation without wheezes, rales, or rhonchi. Breathing is unlabored. Heart: RRR with S1 S2. No  2/6 systolic   murmur . No rubs, or gallops appreciated. Abdomen: Soft, non-tender, non-distended with normoactive bowel sounds. No hepatomegaly. No rebound/guarding. No obvious abdominal masses. Msk:  Strength and tone appear normal for age. Extremities: No clubbing or cyanosis. No edema.  Distal pedal pulses are 2+ and equal bilaterally. Skin: Warm and Dry Neuro: Alert and oriented X 3. CN III-XII intact Grossly normal sensory and motor function . Psych:  Responds to questions appropriately  with a normal affect.        EKG: sinus without ST-T changes   Assessment and Plan:  Hypertrophic obstructive cardiomyopathy  Congestive heart failure class III  Sinus bradycardia - iatrogenic  Morbid obesity  Dyslexia/ADD  Syncope-remote  VT-nonsustained  The patient has hypertrophic obstructive cardiomyopathy associated with significant dyspnea.  Efforts to ameliorate symptoms with beta-blockers and calcium blockers I think have resulted in excessive bradycardia.  We will discontinue the verapamil.  With her obstructive physiology, she is a candidate for mevacamten and will refer her to Dr. Ezra Sites  Restratification for an ICD would put her not at high risk.  There is no first-degree relatives with sudden death, wall thickness is less than 30 mm and her syncope is remote.  Nonsustained ventricular tachycardia is relatively short and slow and thus identified as not being very useful as a predictor.  Her blood pressure response to exercise is hard to assess given the brevity of the exercise and moreover it is more important in young people rather than older.  Given that she has no major risk factors, I do not know that cMRI with estimates of degree of LGE would justify an ICD anyway so would not pursue it given his formidable costs.  She will follow-up with Dr. Therisa Doyne [facility],acting as a scribe for Virl Axe, MD.,have documented all relevant documentation on  the behalf of Virl Axe, MD,as directed by  Virl Axe, MD while in the presence of Virl Axe, MD.  I, Virl Axe, MD, have reviewed all documentation for this visit. The documentation on 03/19/21 for the exam, diagnosis, procedures, and orders are all accurate and complete.

## 2021-03-24 ENCOUNTER — Other Ambulatory Visit: Payer: Self-pay | Admitting: Internal Medicine

## 2021-03-25 ENCOUNTER — Encounter: Payer: Self-pay | Admitting: Internal Medicine

## 2021-03-25 NOTE — Patient Instructions (Addendum)
Blood work was ordered.     Medications changes include :   none  Your prescription(s) have been submitted to your pharmacy. Please take as directed and contact our office if you believe you are having problem(s) with the medication(s).   Please followup in 6 months    Health Maintenance, Female Adopting a healthy lifestyle and getting preventive care are important in promoting health and wellness. Ask your health care provider about: The right schedule for you to have regular tests and exams. Things you can do on your own to prevent diseases and keep yourself healthy. What should I know about diet, weight, and exercise? Eat a healthy diet  Eat a diet that includes plenty of vegetables, fruits, low-fat dairy products, and lean protein. Do not eat a lot of foods that are high in solid fats, added sugars, or sodium. Maintain a healthy weight Body mass index (BMI) is used to identify weight problems. It estimates body fat based on height and weight. Your health care provider can help determine your BMI and help you achieve or maintain a healthy weight. Get regular exercise Get regular exercise. This is one of the most important things you can do for your health. Most adults should: Exercise for at least 150 minutes each week. The exercise should increase your heart rate and make you sweat (moderate-intensity exercise). Do strengthening exercises at least twice a week. This is in addition to the moderate-intensity exercise. Spend less time sitting. Even light physical activity can be beneficial. Watch cholesterol and blood lipids Have your blood tested for lipids and cholesterol at 57 years of age, then have this test every 5 years. Have your cholesterol levels checked more often if: Your lipid or cholesterol levels are high. You are older than 57 years of age. You are at high risk for heart disease. What should I know about cancer screening? Depending on your health history and  family history, you may need to have cancer screening at various ages. This may include screening for: Breast cancer. Cervical cancer. Colorectal cancer. Skin cancer. Lung cancer. What should I know about heart disease, diabetes, and high blood pressure? Blood pressure and heart disease High blood pressure causes heart disease and increases the risk of stroke. This is more likely to develop in people who have high blood pressure readings or are overweight. Have your blood pressure checked: Every 3-5 years if you are 74-52 years of age. Every year if you are 4 years old or older. Diabetes Have regular diabetes screenings. This checks your fasting blood sugar level. Have the screening done: Once every three years after age 49 if you are at a normal weight and have a low risk for diabetes. More often and at a younger age if you are overweight or have a high risk for diabetes. What should I know about preventing infection? Hepatitis B If you have a higher risk for hepatitis B, you should be screened for this virus. Talk with your health care provider to find out if you are at risk for hepatitis B infection. Hepatitis C Testing is recommended for: Everyone born from [birth year] through 1966. Anyone with known risk factors for hepatitis C. Sexually transmitted infections (STIs) Get screened for STIs, including gonorrhea and chlamydia, if: You are sexually active and are younger than 57 years of age. You are older than 57 years of age and your health care provider tells you that you are at risk for this type of infection. Your sexual activity  has changed since you were last screened, and you are at increased risk for chlamydia or gonorrhea. Ask your health care provider if you are at risk. Ask your health care provider about whether you are at high risk for HIV. Your health care provider may recommend a prescription medicine to help prevent HIV infection. If you choose to take medicine to prevent HIV,  you should first get tested for HIV. You should then be tested every 3 months for as long as you are taking the medicine. Pregnancy If you are about to stop having your period (premenopausal) and you may become pregnant, seek counseling before you get pregnant. Take 400 to 800 micrograms (mcg) of folic acid every day if you become pregnant. Ask for birth control (contraception) if you want to prevent pregnancy. Osteoporosis and menopause Osteoporosis is a disease in which the bones lose minerals and strength with aging. This can result in bone fractures. If you are 60 years old or older, or if you are at risk for osteoporosis and fractures, ask your health care provider if you should: Be screened for bone loss. Take a calcium or vitamin D supplement to lower your risk of fractures. Be given hormone replacement therapy (HRT) to treat symptoms of menopause. Follow these instructions at home: Alcohol use Do not drink alcohol if: Your health care provider tells you not to drink. You are pregnant, may be pregnant, or are planning to become pregnant. If you drink alcohol: Limit how much you have to: 0-1 drink a day. Know how much alcohol is in your drink. In the [place] one drink equals one 12 oz bottle of beer (355 mL), one 5 oz glass of wine (148 mL), or one 1 oz glass of hard liquor (44 mL). Lifestyle Do not use any products that contain nicotine or tobacco. These products include cigarettes, chewing tobacco, and vaping devices, such as e-cigarettes. If you need help quitting, ask your health care provider. Do not use street drugs. Do not share needles. Ask your health care provider for help if you need support or information about quitting drugs. General instructions Schedule regular health, dental, and eye exams. Stay current with your vaccines. Tell your health care provider if: You often feel depressed. You have ever been abused or do not feel safe at home. Summary Adopting a healthy  lifestyle and getting preventive care are important in promoting health and wellness. Follow your health care provider's instructions about healthy diet, exercising, and getting tested or screened for diseases. Follow your health care provider's instructions on monitoring your cholesterol and blood pressure. This information is not intended to replace advice given to you by your health care provider. Make sure you discuss any questions you have with your health care provider. Document Revised: 07/27/2020 Document Reviewed: 07/27/2020 Elsevier Patient Education  [place].

## 2021-03-25 NOTE — Progress Notes (Signed)
Subjective:    Patient ID: Kylie Wright, female    DOB: 1965/08/03, 56 y.o.   MRN: [id number]   This visit occurred during the SARS-CoV-2 public health emergency.  Safety protocols were in place, including screening questions prior to the visit, additional usage of staff PPE, and extensive cleaning of exam room while observing appropriate contact time as indicated for disinfecting solutions.    HPI She is here for a physical exam.   She has had and has multiple medication apps and is starting to accrue medical debt.   She has had bunion on her left first MTP base for a while and knows she needs to have surgery eventually.  She had a bunionectomy on the right years ago.  She can not do surgery now.    She is undergoing more evaluation of her vision and heart.    Medications and allergies reviewed with patient and updated if appropriate.  Patient Active Problem List   Diagnosis Date Noted   Glaucoma 03/26/2021   Cataract 03/26/2021   Bradycardia 03/16/2021   HOCM (hypertrophic obstructive cardiomyopathy) ([place]) 10/17/2020   Cardiac murmur 09/11/2020   Obesity (BMI 30.0-34.9) 02/22/2019   Tobacco dependence due to cigarettes 08/17/2018   Hypertension 07/28/2017   Prediabetes 07/29/2016   ADD (attention deficit disorder) 01/01/2016   Syncope 01/01/2016   History of alcohol abuse 01/01/2016   History of drug abuse ([place]) 01/01/2016   Bipolar disorder ([place]) 01/01/2016   Hyperlipidemia 01/01/2016   PTSD (post-traumatic stress disorder) 01/01/2016   Osteoarthritis of both feet 01/01/2016   Bilateral hip pain 01/01/2016   IC (interstitial cystitis) 07/25/2012    Current Outpatient Medications on File Prior to Visit  Medication Sig Dispense Refill   amphetamine-dextroamphetamine (ADDERALL XR) 20 MG 24 hr capsule Take 20 mg by mouth every morning.  0   latanoprost (XALATAN) 0.005 % ophthalmic solution Place 0.024 drops into both eyes at bedtime.     loratadine (CLARITIN) 10 MG  tablet Take 10 mg by mouth daily as needed for allergies.     Melatonin 10 MG TABS Take by mouth.     meloxicam (MOBIC) 7.5 MG tablet TAKE 1 TABLET BY MOUTH TWICE A DAY AS NEEDED PAIN 60 tablet 1   OLANZapine-Samidorphan 5-10 MG TABS Take by mouth.     Potassium 99 MG TABS Take by mouth.     timolol (TIMOPTIC) 0.5 % ophthalmic solution SMARTSIG:In Eye(s)     VESICARE 10 MG tablet Take 10 mg by mouth daily.  3   No current facility-administered medications on file prior to visit.    Past Medical History:  Diagnosis Date   ADHD (attention deficit hyperactivity disorder)    Bipolar 1 disorder (HCC)    History of alcohol abuse    History of drug abuse ([place])    Interstitial cystitis     Past Surgical History:  Procedure Laterality Date   ADENOIDECTOMY  1971   APPENDECTOMY     SALPINGECTOMY  1993   vaginal surgery      Social History   Socioeconomic History   Marital status: Single    Spouse name: Not on file   Number of children: 1   Years of education: Not on file   Highest education level: Not on file  Occupational History    Employer: [employer]  Tobacco Use   Smoking status: Every Day    Packs/day: 0.50    Types: Cigarettes   Smokeless tobacco: Never  Substance and  Sexual Activity   Alcohol use: No    Alcohol/week: 0.0 standard drinks   Drug use: No    Comment: h/o abuse hasn't used since 2002   Sexual activity: Not Currently    Partners: Male    Birth control/protection: None  Other Topics Concern   Not on file  Social History Narrative   Exercise:  Walks a lot, tries to [place]    Social Determinants of Health   Financial Resource Strain: Not on file  Food Insecurity: Not on file  Transportation Needs: Not on file  Physical Activity: Not on file  Stress: Not on file  Social Connections: Not on file    Family History  Problem Relation Age of Onset   Arthritis Mother    Hyperlipidemia Mother    Heart disease Mother    Stroke Mother     Hypertension Mother    Diabetes Mother    Mental illness Mother    Alcohol abuse Father    Hypertension Other    Diabetes Other    Mental illness Other    Cancer Other    Cancer Sister 90       small intestine    Alcoholism Other     Review of Systems  Constitutional:  Negative for chills and fever.  Eyes:  Positive for visual disturbance (seeing eye doctor - cataracts/glaucoma).  Respiratory:  Positive for cough, shortness of breath and wheezing.   Cardiovascular:  Positive for chest pain (chronic, intermittent). Negative for palpitations and leg swelling.  Gastrointestinal:  Positive for constipation. Negative for abdominal pain, blood in stool, diarrhea and nausea.       No gerd  Genitourinary:  Negative for hematuria.  Musculoskeletal:  Positive for arthralgias and back pain.  Skin:  Negative for rash.  Neurological:  Positive for light-headedness and headaches (occ - stress related).  Psychiatric/Behavioral:  Positive for dysphoric mood. The patient is nervous/anxious.       Objective:   Vitals:   03/26/21 1000  BP: (!) 158/88  Pulse: 83  Temp: 97.9 F (36.6 C)  SpO2: 98%   Filed Weights   03/26/21 1000  Weight: 193 lb (87.5 kg)   Body mass index is 35.3 kg/m.  BP Readings from Last 3 Encounters:  03/26/21 (!) 158/88  03/19/21 136/74  01/22/21 128/74    Wt Readings from Last 3 Encounters:  03/26/21 193 lb (87.5 kg)  03/19/21 193 lb (87.5 kg)  01/22/21 209 lb 3.2 oz (94.9 kg)    Depression screen PHQ 2/9 09/11/2020  Decreased Interest 0  Down, Depressed, Hopeless 0  PHQ - 2 Score 0     No flowsheet data found.     Physical Exam Constitutional: She appears well-developed and well-nourished. No distress.  HENT:  Head: Normocephalic and atraumatic.  Right Ear: External ear normal. Normal ear canal and TM Left Ear: External ear normal.  Normal ear canal and TM Mouth/Throat: Oropharynx is clear and moist.  Eyes: Conjunctivae and EOM are normal.   Neck: Neck supple. No tracheal deviation present. No thyromegaly present.  No carotid bruit  Cardiovascular: Normal rate, regular rhythm and normal heart sounds.   No murmur heard.  No edema. Pulmonary/Chest: Effort normal and breath sounds normal. No respiratory distress. She has no wheezes. She has no rales.  Breast: deferred   Abdominal: Soft. She exhibits no distension. There is no tenderness.  Lymphadenopathy: She has no cervical adenopathy.  Skin: Skin is warm and dry. She is not diaphoretic.  Psychiatric: She has a normal mood and affect. Her behavior is normal.     Lab Results  Component Value Date   WBC 13.3 (H) 03/26/2021   HGB 13.4 03/26/2021   HCT 40.2 03/26/2021   PLT 192.0 03/26/2021   GLUCOSE 108 (H) 03/26/2021   CHOL 149 03/26/2021   TRIG 158.0 (H) 03/26/2021   HDL 41.70 03/26/2021   LDLCALC 75 03/26/2021   ALT 15 03/26/2021   AST 22 03/26/2021   NA 134 (L) 03/26/2021   K 4.0 03/26/2021   CL 103 03/26/2021   CREATININE 1.00 03/26/2021   BUN 14 03/26/2021   CO2 24 03/26/2021   TSH 4.08 03/26/2021   HGBA1C 5.8 03/26/2021         Assessment & Plan:   Physical exam: Screening blood work  ordered Exercise  not regular  Weight  encouraged weight loss Substance abuse  none   Reviewed recommended immunizations.   Health Maintenance  Topic Date Due   Fecal DNA (Cologuard)  Never done   COVID-19 Vaccine (5 - Booster for Moderna series) 09/23/2020   MAMMOGRAM  06/13/2022   PAP SMEAR-Modifier  11/28/2023   TETANUS/TDAP  07/23/2026   Pneumococcal Vaccine 27-39 Years old (3 - PPSV23 if available, else PCV20) 11/21/2029   INFLUENZA VACCINE  Completed   Hepatitis C Screening  Completed   HIV Screening  Completed   Zoster Vaccines- Shingrix  Completed   HPV VACCINES  Aged Out          See Problem List for Assessment and Plan of chronic medical problems.

## 2021-03-26 ENCOUNTER — Other Ambulatory Visit: Payer: Self-pay

## 2021-03-26 ENCOUNTER — Ambulatory Visit (INDEPENDENT_AMBULATORY_CARE_PROVIDER_SITE_OTHER): Payer: BC Managed Care – PPO | Admitting: Internal Medicine

## 2021-03-26 VITALS — BP 158/88 | HR 83 | Temp 97.9°F | Ht 62.0 in | Wt 193.0 lb

## 2021-03-26 DIAGNOSIS — Z0000 Encounter for general adult medical examination without abnormal findings: Secondary | ICD-10-CM | POA: Diagnosis not present

## 2021-03-26 DIAGNOSIS — E7849 Other hyperlipidemia: Secondary | ICD-10-CM | POA: Diagnosis not present

## 2021-03-26 DIAGNOSIS — I421 Obstructive hypertrophic cardiomyopathy: Secondary | ICD-10-CM

## 2021-03-26 DIAGNOSIS — I10 Essential (primary) hypertension: Secondary | ICD-10-CM

## 2021-03-26 DIAGNOSIS — R7303 Prediabetes: Secondary | ICD-10-CM

## 2021-03-26 DIAGNOSIS — E669 Obesity, unspecified: Secondary | ICD-10-CM

## 2021-03-26 DIAGNOSIS — H409 Unspecified glaucoma: Secondary | ICD-10-CM | POA: Insufficient documentation

## 2021-03-26 DIAGNOSIS — F988 Other specified behavioral and emotional disorders with onset usually occurring in childhood and adolescence: Secondary | ICD-10-CM

## 2021-03-26 DIAGNOSIS — H269 Unspecified cataract: Secondary | ICD-10-CM | POA: Insufficient documentation

## 2021-03-26 DIAGNOSIS — F319 Bipolar disorder, unspecified: Secondary | ICD-10-CM

## 2021-03-26 LAB — LIPID PANEL
Cholesterol: 149 mg/dL (ref 0–200)
HDL: 41.7 mg/dL (ref >39.00)
LDL Cholesterol: 75 mg/dL (ref 0–99)
NonHDL: 106.95
Total CHOL/HDL Ratio: 4
Triglycerides: 158 mg/dL — ABNORMAL HIGH (ref 0.0–149.0)
VLDL: 31.6 mg/dL (ref 0.0–40.0)

## 2021-03-26 LAB — CBC WITH DIFFERENTIAL/PLATELET
Basophils Absolute: 0 10*3/uL (ref 0.0–0.1)
Basophils Relative: 0.3 % (ref 0.0–3.0)
Eosinophils Absolute: 0.2 10*3/uL (ref 0.0–0.7)
Eosinophils Relative: 1.3 % (ref 0.0–5.0)
HCT: 40.2 % (ref 36.0–46.0)
Hemoglobin: 13.4 g/dL (ref 12.0–15.0)
Lymphocytes Relative: 34.8 % (ref 12.0–46.0)
Lymphs Abs: 4.6 10*3/uL — ABNORMAL HIGH (ref 0.7–4.0)
MCHC: 33.4 g/dL (ref 30.0–36.0)
MCV: 94.6 fl (ref 78.0–100.0)
Monocytes Absolute: 0.8 10*3/uL (ref 0.1–1.0)
Monocytes Relative: 6.3 % (ref 3.0–12.0)
Neutro Abs: 7.6 10*3/uL (ref 1.4–7.7)
Neutrophils Relative %: 57.3 % (ref 43.0–77.0)
Platelets: 192 10*3/uL (ref 150.0–400.0)
RBC: 4.25 Mil/uL (ref 3.87–5.11)
RDW: 14.6 % (ref 11.5–15.5)
WBC: 13.3 10*3/uL — ABNORMAL HIGH (ref 4.0–10.5)

## 2021-03-26 LAB — COMPREHENSIVE METABOLIC PANEL
ALT: 15 U/L (ref 0–35)
AST: 22 U/L (ref 0–37)
Albumin: 4.2 g/dL (ref 3.5–5.2)
Alkaline Phosphatase: 81 U/L (ref 39–117)
BUN: 14 mg/dL (ref 6–23)
CO2: 24 mEq/L (ref 19–32)
Calcium: 9.6 mg/dL (ref 8.4–10.5)
Chloride: 103 mEq/L (ref 96–112)
Creatinine, Ser: 1 mg/dL (ref 0.40–1.20)
GFR: 63.05 mL/min (ref >60.00)
Glucose, Bld: 108 mg/dL — ABNORMAL HIGH (ref 70–99)
Potassium: 4 mEq/L (ref 3.5–5.1)
Sodium: 134 mEq/L — ABNORMAL LOW (ref 135–145)
Total Bilirubin: 0.8 mg/dL (ref 0.2–1.2)
Total Protein: 8 g/dL (ref 6.0–8.3)

## 2021-03-26 LAB — HEMOGLOBIN A1C: Hgb A1c MFr Bld: 5.8 % (ref 4.6–6.5)

## 2021-03-26 LAB — TSH: TSH: 4.08 u[IU]/mL (ref 0.35–5.50)

## 2021-03-26 MED ORDER — HYDROCHLOROTHIAZIDE 50 MG PO TABS
50.0000 mg | ORAL_TABLET | Freq: Every morning | ORAL | 1 refills | Status: DC
Start: 2021-03-26 — End: 2021-11-29

## 2021-03-26 MED ORDER — NEBIVOLOL HCL 10 MG PO TABS
10.0000 mg | ORAL_TABLET | Freq: Every day | ORAL | 1 refills | Status: DC
Start: 2021-03-26 — End: 2021-04-30

## 2021-03-26 MED ORDER — ROSUVASTATIN CALCIUM 10 MG PO TABS: 10.0000 mg | ORAL_TABLET | Freq: Every day | ORAL | 2 refills | Status: DC

## 2021-03-26 NOTE — Assessment & Plan Note (Signed)
Chronic Management per psychiatry 

## 2021-03-26 NOTE — Assessment & Plan Note (Addendum)
Chronic Check TSH Management per psychiatry

## 2021-03-26 NOTE — Assessment & Plan Note (Addendum)
Chronic Blood pressure not controlled CMP Continue HCTZ 50 mg daily, Bystolic 10 mg daily Seeing cardiology and they have changed medication so will let them adjust after cardac eval

## 2021-03-26 NOTE — Assessment & Plan Note (Addendum)
Chronic Regular exercise and healthy diet encouraged Check lipid panel  Continue rosuvastatin 10 mg daily 

## 2021-03-26 NOTE — Assessment & Plan Note (Signed)
Chronic Encouraged weight loss Discussed importance of regular exercise-30 minutes 5 times a week Decrease portions-high in protein, low sugar/carbohydrate diet

## 2021-03-26 NOTE — Assessment & Plan Note (Signed)
Chronic Check a1c Low sugar / carb diet Stressed regular exercise  

## 2021-03-26 NOTE — Assessment & Plan Note (Signed)
Has seen cardiology and going for a cardiac MRI

## 2021-04-15 NOTE — Progress Notes (Signed)
Cardiology Office Note:    Date:  04/16/2021   ID:  Kylie Wright, DOB [birth year], MRN [id number]  PCP:  Kylie Rail, MD   [facility] Providers Cardiologist:  None     Referring MD: Kylie Rail, MD   CC: Kylie Wright  History of Present Illness:    Kylie Wright is a 56 y.o. female with a hx of HOCM seen by Dr. Harl Wright and Kylie Wright who presents for initiation of mavacamten.  Patient notes that she is feeling poorly.  Notes that she had chest pain that Wright and goes.  Has SOB that is not controlled.  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  Feels dizzy and on bad days has near syncope.  Improves with hydration.  Was only on verapamil for 2 weeks.     Past Medical History:  Diagnosis Date   ADHD (attention deficit hyperactivity disorder)    Bipolar 1 disorder ([place])    History of alcohol abuse    History of drug abuse ([place])    Interstitial cystitis     Past Surgical History:  Procedure Laterality Date   ADENOIDECTOMY  1971   APPENDECTOMY     SALPINGECTOMY  1993   vaginal surgery      Current Medications: Current Meds  Medication Sig   amphetamine-dextroamphetamine (ADDERALL XR) 20 MG 24 hr capsule Take 20 mg by mouth every morning.   hydrochlorothiazide (HYDRODIURIL) 50 MG tablet Take 1 tablet (50 mg total) by mouth every morning.   latanoprost (XALATAN) 0.005 % ophthalmic solution Place 0.024 drops into both eyes at bedtime.   loratadine (CLARITIN) 10 MG tablet Take 10 mg by mouth daily as needed for allergies.   Melatonin 10 MG TABS Take by mouth.   meloxicam (MOBIC) 7.5 MG tablet TAKE 1 TABLET BY MOUTH TWICE A DAY AS NEEDED PAIN   nebivolol (BYSTOLIC) 10 MG tablet Take 1 tablet (10 mg total) by mouth daily.   OLANZapine-Samidorphan 5-10 MG TABS Take by mouth.   Potassium 99 MG TABS Take by mouth.   rosuvastatin (CRESTOR) 10 MG tablet Take 1 tablet (10 mg total) by mouth daily.   timolol (TIMOPTIC) 0.5 % ophthalmic solution SMARTSIG:In  Eye(s)   VESICARE 10 MG tablet Take 10 mg by mouth daily.     Allergies:   Patient has no known allergies.   Social History   Socioeconomic History   Marital status: Single    Spouse name: Not on file   Number of children: 1   Years of education: Not on file   Highest education level: Not on file  Occupational History    Employer: [employer]  Tobacco Use   Smoking status: Every Day    Packs/day: 0.50    Types: Cigarettes   Smokeless tobacco: Never  Substance and Sexual Activity   Alcohol use: No    Alcohol/week: 0.0 standard drinks   Drug use: No    Comment: h/o abuse hasn't used since 2002   Sexual activity: Not Currently    Partners: Male    Birth control/protection: None  Other Topics Concern   Not on file  Social History Narrative   Exercise:  Walks a lot, tries to [place]    Social Determinants of Health   Financial Resource Strain: Not on file  Food Insecurity: Not on file  Transportation Needs: Not on file  Physical Activity: Not on file  Stress: Not on file  Social Connections: Not on file  Social: Formerly very active- worked at [employer], now works in Human resources officer  Family History: The patient's family history includes Alcohol abuse in her father; Alcoholism in an other family member; Arthritis in her mother; Cancer in an other family member; Cancer (age of onset: 34) in her sister; Diabetes in her mother and another family member; Heart disease in her mother; Hyperlipidemia in her mother; Hypertension in her mother and another family member; Mental illness in her mother and another family member; Stroke in her mother.  ROS:   Please see the history of present illness.     All other systems reviewed and are negative.  EKGs/Labs/Other Studies Reviewed:    The following studies were reviewed today:  Transthoracic Echocardiogram: Date: 10/16/20 Results:  1. Left ventricular ejection fraction, by estimation, is 60 to 65%. The  left  ventricle has normal function. The left ventricle has no regional  wall motion abnormalities. There is moderate left ventricular hypertrophy.  Left ventricular diastolic  parameters are consistent with Grade I diastolic dysfunction (impaired  relaxation).   2. Right ventricular systolic function is normal. The right ventricular  size is normal.   3. Left atrial size was moderately dilated.   4. The mitral valve is abnormal. Trivial mitral valve regurgitation. No  evidence of mitral stenosis.   5. Appears to be SAM with bifid CW across AV suggesting HOCM with peak  gradient at rest 39 mmHg Consider f/u TTE to confirm sub valvular gradient  and mechanism . The aortic valve was not well visualized. There is mild  calcification of the aortic valve.  Aortic valve regurgitation is not visualized. Mild aortic valve sclerosis  is present, with no evidence of aortic valve stenosis.   6. The inferior vena cava is normal in size with greater than 50%  respiratory variability, suggesting right atrial pressure of 3 mmHg.     Recent Labs: 03/26/2021: ALT 15; BUN 14; Creatinine, Ser 1.00; Hemoglobin 13.4; Platelets 192.0; Potassium 4.0; Sodium 134; TSH 4.08  Recent Lipid Panel    Component Value Date/Time   CHOL 149 03/26/2021 1102   TRIG 158.0 (H) 03/26/2021 1102   HDL 41.70 03/26/2021 1102   CHOLHDL 4 03/26/2021 1102   VLDL 31.6 03/26/2021 1102   LDLCALC 75 03/26/2021 1102        Physical Exam:    VS:  BP 122/72    Pulse (!) 53    Ht 5\' 2"  (1.575 m)    Wt 87.5 kg    SpO2 97%    BMI 35.30 kg/m     Wt Readings from Last 3 Encounters:  04/16/21 87.5 kg  03/26/21 87.5 kg  03/19/21 87.5 kg     Gen: no distress,    Neck: No JVD,  Cardiac: No Rubs or Gallops, harsh systolic murmur worse with standing, RRR and +2 radial pulses Respiratory: Clear to auscultation bilaterally, normal effort, normal  respiratory rate GI: Soft, nontender, non-distended  MS: No  edema;  moves all  extremities Integument: Skin feels well Neuro:  At time of evaluation, alert and oriented to person/place/time/situation  Psych: Normal affect, patient feels dejected   ASSESSMENT:    1. HOCM (hypertrophic obstructive cardiomyopathy) (HCC)    PLAN:    Hypertrophic Cardiomyopathy - LVOT gradient at least - No Family history, discussed 1st degree family history screening - NYHA III - she is tolerating nebivolol 10 mg , given heart rate could not do dual AV nodal therapy - we have discussed at length  her financial situation:  I worry that the need for surveillence echos and the novel medication would not be financially viable - likewise, I think definitive surgery ([facility] or [facility]) would be prohibitive for her current financial status, though I offered referral. - we will price check her Camzyos; if feasible, will switch to veramapil 150, stop nebiviolol, do stress echo (discussed) and then Wright camzyos 5 - if not we will Wright norpace XL 100 mg PO BID  Time Spent Directly with Patient:   I have spent a total of 40 minutes with the patient reviewing notes, imaging, EKGs, labs and examining the patient as well as establishing an assessment and plan that was discussed personally with the patient.  > 50% of time was spent in direct patient care.         Medication Adjustments/Labs and Tests Ordered: Current medicines are reviewed at length with the patient today.  Concerns regarding medicines are outlined above.  No orders of the defined types were placed in this encounter.  No orders of the defined types were placed in this encounter.   Patient Instructions  Medication Instructions:  Your physician recommends that you continue on your current medications as directed. Please refer to the Current Medication list given to you today. You may potentially be started on mavacamten (Camzyos)  *If you need a refill on your cardiac medications before your next appointment, please call  your pharmacy*   Lab Work: NONE If you have labs (blood work) drawn today and your tests are completely normal, you will receive your results only by: [place] (if you have MyChart) OR A paper copy in the mail If you have any lab test that is abnormal or we need to change your treatment, we will call you to review the results.   Testing/Procedures: NONE   Follow-Up: At [facility], you and your health needs are our priority.  As part of our continuing mission to provide you with exceptional heart care, we have created designated Provider Care Teams.  These Care Teams include your primary Cardiologist (physician) and Advanced Practice Providers (APPs -  Physician Assistants and Nurse Practitioners) who all work together to provide you with the care you need, when you need it.  We recommend signing up for the patient portal called "MyChart".  Sign up information is provided on this After Visit Summary.  MyChart is used to connect with patients for Virtual Visits (Telemedicine).  Patients are able to view lab/test results, encounter notes, upcoming appointments, etc.  Non-urgent messages can be sent to your provider as well.   To learn more about what you can do with MyChart, go to [email or web address].    Your next appointment:   3 month(s)  The format for your next appointment:   In Person  Provider:   Rudean Haskell, MD      Signed, Werner Lean, MD  04/16/2021 11:01 AM    [place]

## 2021-04-16 ENCOUNTER — Encounter: Payer: Self-pay | Admitting: Internal Medicine

## 2021-04-16 ENCOUNTER — Other Ambulatory Visit: Payer: Self-pay

## 2021-04-16 ENCOUNTER — Ambulatory Visit: Payer: BC Managed Care – PPO | Admitting: Internal Medicine

## 2021-04-16 VITALS — BP 122/72 | HR 53 | Ht 62.0 in | Wt 193.0 lb

## 2021-04-16 DIAGNOSIS — I421 Obstructive hypertrophic cardiomyopathy: Secondary | ICD-10-CM | POA: Diagnosis not present

## 2021-04-16 NOTE — Patient Instructions (Signed)
Medication Instructions:  Your physician recommends that you continue on your current medications as directed. Please refer to the Current Medication list given to you today. You may potentially be started on mavacamten (Camzyos)  *If you need a refill on your cardiac medications before your next appointment, please call your pharmacy*   Lab Work: NONE If you have labs (blood work) drawn today and your tests are completely normal, you will receive your results only by: [place] (if you have MyChart) OR A paper copy in the mail If you have any lab test that is abnormal or we need to change your treatment, we will call you to review the results.   Testing/Procedures: NONE   Follow-Up: At [facility], you and your health needs are our priority.  As part of our continuing mission to provide you with exceptional heart care, we have created designated Provider Care Teams.  These Care Teams include your primary Cardiologist (physician) and Advanced Practice Providers (APPs -  Physician Assistants and Nurse Practitioners) who all work together to provide you with the care you need, when you need it.  We recommend signing up for the patient portal called "MyChart".  Sign up information is provided on this After Visit Summary.  MyChart is used to connect with patients for Virtual Visits (Telemedicine).  Patients are able to view lab/test results, encounter notes, upcoming appointments, etc.  Non-urgent messages can be sent to your provider as well.   To learn more about what you can do with MyChart, go to [email or web address].    Your next appointment:   3 month(s)  The format for your next appointment:   In Person  Provider:   Rudean Haskell, MD

## 2021-04-28 ENCOUNTER — Telehealth: Payer: Self-pay | Admitting: Pharmacist

## 2021-04-28 DIAGNOSIS — I421 Obstructive hypertrophic cardiomyopathy: Secondary | ICD-10-CM

## 2021-04-28 NOTE — Telephone Encounter (Signed)
Prior auth for [employer] has been submitted. Enrollment form has been faxed.

## 2021-04-30 ENCOUNTER — Other Ambulatory Visit: Payer: Self-pay

## 2021-04-30 ENCOUNTER — Encounter: Payer: Self-pay | Admitting: Internal Medicine

## 2021-04-30 ENCOUNTER — Ambulatory Visit: Payer: BC Managed Care – PPO | Admitting: Internal Medicine

## 2021-04-30 ENCOUNTER — Telehealth: Payer: Self-pay

## 2021-04-30 VITALS — BP 128/80 | HR 58 | Resp 20 | Ht 62.0 in | Wt 193.9 lb

## 2021-04-30 DIAGNOSIS — I422 Other hypertrophic cardiomyopathy: Secondary | ICD-10-CM

## 2021-04-30 MED ORDER — NEBIVOLOL HCL 10 MG PO TABS: 10.0000 mg | ORAL_TABLET | Freq: Every day | ORAL | 1 refills | Status: DC

## 2021-04-30 MED ORDER — MELOXICAM 7.5 MG PO TABS: ORAL_TABLET | ORAL | 1 refills | Status: DC

## 2021-04-30 MED ORDER — ROSUVASTATIN CALCIUM 10 MG PO TABS: 10.0000 mg | ORAL_TABLET | Freq: Every day | ORAL | 2 refills | Status: DC

## 2021-04-30 NOTE — Telephone Encounter (Signed)
Sent in today 

## 2021-04-30 NOTE — Progress Notes (Signed)
Cardiology Office Note:    Date:  04/30/2021   ID:  Kylie Wright, DOB 1966/01/17, MRN [id number]  PCP:  Binnie Rail, MD   [facility] Providers Cardiologist:  None     Referring MD: Binnie Rail, MD   No chief complaint on file. HOCM  History of Present Illness:   Initial presentation  Kylie Wright is a 56 y.o. female with a hx of bipolar, PTSD, drug abuse, ADHD, cataracts, referral for HOCM  She saw her PCP who heard a murmur on exam 09/11/2020 and echo showed asymmetric basal septal hypertrophy, outflow tract obstruction, late peaking CW doppler. Peak gradient at rest noted to be 39 mmHg, SAM with possible subaortic membrane. She denies chest pain, sob. She has  LH  and dizziness. No palpitations. In 2016, she was presyncopal and she fell and lost consciousness.Marland Kitchen She was at an 8 am AA meeting, she was talking to someone in the parking lot. She felt like she was going to faint. She had another episode and she felt faint before a blood draw.She did not syncopize. She went to the ER and diagnosed with vertigo. She notes some chest pain and feels it is linked to anxiety with overwhelmed. It stays in the center of her chest and stays a long time.  She says happens 20-45 minutes, happens every 3-4 months.  She is an active smoking. Notes some sob with going uphill. 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 This can happen with driving. Her mother had heart dx. Father no heart diease. She has two sisters. They have hypertension She has no family members with SCD.   Interim Hx: An MRI was ordered, and this was cost prohibitive. She had an ETT that appropriate rate response in the setting of sinus bradycardia. She was evaluated by EP as well. Plan was to stop  verapamil. She saw Dr. Gasper Sells for consideration of mevacamtem.  This may be cost prohibitive. She is planning on working part time and/or disability. She is applying to determine if she  meets criteria.   Today she notes that she can feels presyncopal at times, but no syncope. She can get SOB with activity , this is chronic. It has not progressed.  Otherwise she is asymptomatic.  Cardiology Studies  TTE 10/16/2020 1. Left ventricular ejection fraction, by estimation, is 60 to 65%. The  left ventricle has normal function. The left ventricle has no regional  wall motion abnormalities. There is moderate left ventricular hypertrophy.  Left ventricular diastolic  parameters are consistent with Grade I diastolic dysfunction (impaired  relaxation).   2. Right ventricular systolic function is normal. The right ventricular  size is normal.   3. Left atrial size was moderately dilated.   4. The mitral valve is abnormal. Trivial mitral valve regurgitation. No  evidence of mitral stenosis.   5. Appears to be SAM with bifid CW across AV suggesting HOCM with peak  gradient at rest 39 mmHg Consider f/u TTE to confirm sub valvular gradient  and mechanism . The aortic valve was not well visualized. There is mild  calcification of the aortic valve.  Aortic valve regurgitation is not visualized. Mild aortic valve sclerosis  is present, with no evidence of aortic valve stenosis.   6. The inferior vena cava is normal in size with greater than 50%  respiratory variability, suggesting right atrial pressure of 3 mmHg.       Past Medical  History:  Diagnosis Date   ADHD (attention deficit hyperactivity disorder)    Bipolar 1 disorder (HCC)    History of alcohol abuse    History of drug abuse ([place])    Interstitial cystitis     Past Surgical History:  Procedure Laterality Date   ADENOIDECTOMY  1971   APPENDECTOMY     SALPINGECTOMY  1993   vaginal surgery      Current Medications: Current Meds  Medication Sig   amphetamine-dextroamphetamine (ADDERALL XR) 20 MG 24 hr capsule Take 20 mg by mouth every morning.   hydrochlorothiazide (HYDRODIURIL) 50 MG tablet Take 1 tablet (50 mg  total) by mouth every morning.   latanoprost (XALATAN) 0.005 % ophthalmic solution Place 0.024 drops into both eyes at bedtime.   loratadine (CLARITIN) 10 MG tablet Take 10 mg by mouth daily as needed for allergies.   Melatonin 10 MG TABS Take by mouth.   meloxicam (MOBIC) 7.5 MG tablet TAKE 1 TABLET BY MOUTH TWICE A DAY AS NEEDED PAIN   nebivolol (BYSTOLIC) 10 MG tablet Take 1 tablet (10 mg total) by mouth daily.   OLANZapine-Samidorphan 5-10 MG TABS Take by mouth.   Potassium 99 MG TABS Take by mouth.   rosuvastatin (CRESTOR) 10 MG tablet Take 1 tablet (10 mg total) by mouth daily.   timolol (TIMOPTIC) 0.5 % ophthalmic solution SMARTSIG:In Eye(s)   VESICARE 10 MG tablet Take 10 mg by mouth daily.     Allergies:   Patient has no known allergies.   Social History   Socioeconomic History   Marital status: Single    Spouse name: Not on file   Number of children: 1   Years of education: Not on file   Highest education level: Not on file  Occupational History    Employer: [employer]  Tobacco Use   Smoking status: Every Day    Packs/day: 0.50    Types: Cigarettes   Smokeless tobacco: Never  Substance and Sexual Activity   Alcohol use: No    Alcohol/week: 0.0 standard drinks   Drug use: No    Comment: h/o abuse hasn't used since 2002   Sexual activity: Not Currently    Partners: Male    Birth control/protection: None  Other Topics Concern   Not on file  Social History Narrative   Exercise:  Walks a lot, tries to [place]    Social Determinants of Health   Financial Resource Strain: Not on file  Food Insecurity: Not on file  Transportation Needs: Not on file  Physical Activity: Not on file  Stress: Not on file  Social Connections: Not on file     Family History: The patient's family history includes Alcohol abuse in her father; Alcoholism in an other family member; Arthritis in her mother; Cancer in an other family member; Cancer (age of onset: 36) in her  sister; Diabetes in her mother and another family member; Heart disease in her mother; Hyperlipidemia in her mother; Hypertension in her mother and another family member; Mental illness in her mother and another family member; Stroke in her mother.  ROS:   Please see the history of present illness.     All other systems reviewed and are negative.  EKGs/Labs/Other Studies Reviewed:    The following studies were reviewed today:   EKG:  EKG is  ordered today.  The ekg ordered today demonstrates   Sinus bradycardia , LAE  Recent Labs: 03/26/2021: ALT 15; BUN 14; Creatinine, Ser 1.00; Hemoglobin 13.4;  Platelets 192.0; Potassium 4.0; Sodium 134; TSH 4.08  Recent Lipid Panel    Component Value Date/Time   CHOL 149 03/26/2021 1102   TRIG 158.0 (H) 03/26/2021 1102   HDL 41.70 03/26/2021 1102   CHOLHDL 4 03/26/2021 1102   VLDL 31.6 03/26/2021 1102   LDLCALC 75 03/26/2021 1102     Risk Assessment/Calculations:           Physical Exam:    VS:    Vitals:   04/30/21 0915  BP: 128/80  Pulse: (!) 58  Resp: 20  SpO2: 97%     Vitals:   04/30/21 0915  Resp: 20  SpO2: 97%     Wt Readings from Last 3 Encounters:  04/30/21 193 lb 14.4 oz (88 kg)  04/16/21 193 lb (87.5 kg)  03/26/21 193 lb (87.5 kg)     GEN:  Well nourished, well developed in no acute distress HEENT: Normal NECK: No JVD; No carotid bruits LYMPHATICS: No lymphadenopathy CARDIAC: RRR, + SEM at the midsternum louder with valsalva, rubs, gallops RESPIRATORY:  Clear to auscultation without rales, wheezing or rhonchi  ABDOMEN: Soft, non-tender, non-distended MUSCULOSKELETAL:  No edema; No deformity  SKIN: Warm and dry NEUROLOGIC:  Alert and oriented x 3 PSYCHIATRIC: flat affect  ASSESSMENT:    #HOCM: Peak gradient 39 mmHg. No high risk features. She has no family hx of SCD. She has no evidence of NSVT/VT. Did plan for cardiac MRI, however this was cost prohibitive. She was referred for genetic testing.   Otherwise, considering her symptoms are stable will watch her for now. Continue nebivolol. Her rhythm is normal. If her symptoms progress and LVOT gradient increases above 50 mmhg, will consider [facility] referral for septal myectomy/ alcohol ablation.    PLAN:    In order of problems listed above:  Follow up 6 months       Medication Adjustments/Labs and Tests Ordered: Current medicines are reviewed at length with the patient today.  Concerns regarding medicines are outlined above.   Signed, Janina Mayo, MD  04/30/2021 9:17 AM    [facility]

## 2021-04-30 NOTE — Telephone Encounter (Signed)
Pt is calling requesting a 90 day supply for the following medications. Pt will be loosing her Rx insurance.  Refill for:  meloxicam (MOBIC) 7.5 MG tablet nebivolol (BYSTOLIC) 10 MG tablet rosuvastatin (CRESTOR) 10 MG tablet  Pharmacy: [facility] ([place], [place] 03/26/21  Pt CB [phone]

## 2021-04-30 NOTE — Patient Instructions (Signed)
Medication Instructions:  No Changes In Medications at this time.  *If you need a refill on your cardiac medications before your next appointment, please call your pharmacy*  Follow-Up: At [facility], you and your health needs are our priority.  As part of our continuing mission to provide you with exceptional heart care, we have created designated Provider Care Teams.  These Care Teams include your primary Cardiologist (physician) and Advanced Practice Providers (APPs -  Physician Assistants and Nurse Practitioners) who all work together to provide you with the care you need, when you need it.  We recommend signing up for the patient portal called "MyChart".  Sign up information is provided on this After Visit Summary.  MyChart is used to connect with patients for Virtual Visits (Telemedicine).  Patients are able to view lab/test results, encounter notes, upcoming appointments, etc.  Non-urgent messages can be sent to your provider as well.   To learn more about what you can do with MyChart, go to [email or web address].    Your next appointment:   6 month(s)  The format for your next appointment:   In Person  Provider:   Janina Mayo, MD

## 2021-05-05 NOTE — Telephone Encounter (Signed)
Camzyos enrollment form has been received, however prior authorization through [employer] for Camzyos has been denied. Her insurance requires that LVOT peak gradient be at least 17mmHg at rest or with provocation and that pt has had trial and failure, contraindication, or intolerance to both of the following at max tolerated dose: non-vasodilating beta blocker and calcium channel blocker.

## 2021-05-07 ENCOUNTER — Telehealth: Payer: Self-pay | Admitting: Internal Medicine

## 2021-05-07 NOTE — Telephone Encounter (Signed)
Called pt reviewed MD recommendation.  Pt is agreeable to plan order placed. Verbally reviewed stress echo instructions with pt.  Will mail to pt to review. All questions answered.

## 2021-05-07 NOTE — Telephone Encounter (Signed)
Pt requesting a copy of 03-26-2021 lab result fax to [facility]  Fax [phone] attn: Eino Farber

## 2021-05-07 NOTE — Telephone Encounter (Signed)
Faxed and conformation recieved

## 2021-05-07 NOTE — Addendum Note (Signed)
Addended by: Precious Gilding on: 05/07/2021 05:24 PM   Modules accepted: Orders

## 2021-05-13 ENCOUNTER — Telehealth: Payer: Self-pay | Admitting: Internal Medicine

## 2021-05-13 ENCOUNTER — Other Ambulatory Visit: Payer: Self-pay | Admitting: Internal Medicine

## 2021-05-13 DIAGNOSIS — I421 Obstructive hypertrophic cardiomyopathy: Secondary | ICD-10-CM

## 2021-05-13 NOTE — Progress Notes (Signed)
03/2021 eval had discussed stress test based on sx burden.  Worsening Sx.  Planning for Stress echo and potential mavacamten start.  Rudean Haskell, MD [address], [address] [place], [place] [address] [phone]  12:59 PM

## 2021-05-13 NOTE — Addendum Note (Signed)
Addended by: Rudean Haskell A on: 05/13/2021 01:00 PM   Modules accepted: Orders

## 2021-05-13 NOTE — Telephone Encounter (Signed)
Called Patient- she received CAMZYOS. This was a trial offer and she has yet to be approved for the medication.   Patient will not start the medication until instructed by medical team.   Reviewed with Industry- they note that all of our RN/RPH/MD documentation was appropriate and patient should not have received drug.  They will do a deep dive and reach out with more details.  Patient had no further questions.  She will be getting stress echo for gradient eval  Rudean Haskell, MD [place]  [place], [address] [place],  [address] [phone]  2:55 PM

## 2021-05-24 ENCOUNTER — Telehealth (HOSPITAL_COMMUNITY): Payer: Self-pay

## 2021-05-24 NOTE — Telephone Encounter (Signed)
Spoke with the patient, detailed instructions given. She stated that she understood and would be here for her test. Asked to call back with any questions. Kylie Wright EMTP 

## 2021-05-27 ENCOUNTER — Other Ambulatory Visit: Payer: Self-pay

## 2021-05-27 ENCOUNTER — Ambulatory Visit (HOSPITAL_COMMUNITY): Payer: BC Managed Care – PPO | Attending: Cardiovascular Disease

## 2021-05-27 ENCOUNTER — Ambulatory Visit (HOSPITAL_COMMUNITY): Payer: BC Managed Care – PPO

## 2021-05-27 DIAGNOSIS — I421 Obstructive hypertrophic cardiomyopathy: Secondary | ICD-10-CM | POA: Diagnosis not present

## 2021-05-27 NOTE — Telephone Encounter (Signed)
My Camzyos called asking about patient starting on therapy. I advised that patient did not start therapy bc she did not meet criteria to start medication. She is getting a repeat echo today. ?They talked about the bridge program. We stated that we did not want patient to start med until her insurance approved it. ?Phone # [phone] ?

## 2021-05-28 LAB — ECHOCARDIOGRAM STRESS TEST
MV M vel: 4.69 m/s
MV Peak grad: 88 mmHg
S' Lateral: 2.3 cm

## 2021-05-31 ENCOUNTER — Telehealth: Payer: Self-pay | Admitting: Internal Medicine

## 2021-05-31 NOTE — Telephone Encounter (Signed)
Updated stress echo shows peak LVOT gradient of 63.54mHg. Will submit another prior authorization for Camzyos. ?

## 2021-05-31 NOTE — Telephone Encounter (Signed)
Called pt in regards to camzyos.  Pt wants to know when to take med.  Advised pt not to start medication until told to do so by our office.  Also, advised pt medication can be taken anytime of day.  Pt had no further questions or concerns.  ?

## 2021-05-31 NOTE — Telephone Encounter (Signed)
Pt c/o medication issue: ? ?1. Name of Medication: Camzyos  ? ?2. How are you currently taking this medication (dosage and times per day)?  Patient has not started taking yet  ? ?3. Are you having a reaction (difficulty breathing--STAT)?  ? ?4. What is your medication issue? Patient wanted to know if she is supposed to take it in the morning or at night ?

## 2021-06-07 NOTE — Telephone Encounter (Signed)
Camzyos denied with the explanation that patient has not tried both BB and CCB. Chart notes were submitted that showed that patient has been on both and HR cannot tolerate the combo together. Will appeal decision. Patient needs to sign appointment of representative form. She will stop by today to sign. Then I will fax off appeals letter and documentation. ?

## 2021-06-10 ENCOUNTER — Other Ambulatory Visit: Payer: Self-pay | Admitting: Internal Medicine

## 2021-06-10 DIAGNOSIS — Z1231 Encounter for screening mammogram for malignant neoplasm of breast: Secondary | ICD-10-CM

## 2021-06-10 NOTE — Telephone Encounter (Signed)
Appeals letter and documentation faxed 3/23 ?

## 2021-06-14 ENCOUNTER — Ambulatory Visit
Admission: RE | Admit: 2021-06-14 | Discharge: 2021-06-14 | Disposition: A | Discharge status: Home / Self Care | Payer: BC Managed Care – PPO | Source: Ambulatory Visit | Attending: Internal Medicine | Admitting: Internal Medicine

## 2021-06-14 ENCOUNTER — Telehealth: Payer: Self-pay | Admitting: Internal Medicine

## 2021-06-14 DIAGNOSIS — I421 Obstructive hypertrophic cardiomyopathy: Secondary | ICD-10-CM

## 2021-06-14 DIAGNOSIS — Z1231 Encounter for screening mammogram for malignant neoplasm of breast: Secondary | ICD-10-CM

## 2021-06-14 NOTE — Telephone Encounter (Signed)
Called Patient- she has been approved for Mavacamten ? ?Discussed  that she still have symptoms with no ne medication; no grapefruit juice or St. John's Wort. ? ?Will Start Mavacamten 5 mg- start date 06/19/21. ? ?Screening echos week 4, 8, and 12; visit with me the week after week 12 (can overbook) ? ?If worsening at week 4 will drop dose and metoprolol from nebivolol. ? ?Patient had no further questions. ? ?Rudean Haskell, MD ?Cardiologist ?[facility]  ?[address], [place] ?[place], [place] [address] ?[phone]  ?8:13 AM ? ?

## 2021-06-14 NOTE — Telephone Encounter (Signed)
Prior authorization for camzyos now approved through 12/12/21. ?

## 2021-06-15 NOTE — Telephone Encounter (Signed)
I have messaged [employer] rep Heather regarding Camzyos approval so she can help follow up with ensuring pt receives medication. ?

## 2021-06-16 NOTE — Addendum Note (Signed)
Addended by: Precious Gilding on: 06/16/2021 01:25 PM ? ? Modules accepted: Orders ? ?

## 2021-06-16 NOTE — Telephone Encounter (Signed)
Received fax that rx for Noralee Stain was transferred to [facility] for dispensing (their NPI is [id number]), copay $100, PA approved through 12/12/21. ?

## 2021-06-16 NOTE — Telephone Encounter (Signed)
Orders placed for Camzyos f/u echos.  ?

## 2021-06-17 NOTE — Telephone Encounter (Signed)
Called pt scheduled Camzyos 4 week echo for 07/15/21 at 11:20 am.  ?

## 2021-07-07 ENCOUNTER — Encounter: Payer: Self-pay | Admitting: Internal Medicine

## 2021-07-07 NOTE — Progress Notes (Signed)
Outside notes received. Information abstracted. Notes sent to scan.  

## 2021-07-15 ENCOUNTER — Ambulatory Visit (HOSPITAL_COMMUNITY): Payer: Self-pay | Attending: Cardiology

## 2021-07-15 ENCOUNTER — Other Ambulatory Visit (HOSPITAL_COMMUNITY): Payer: BC Managed Care – PPO

## 2021-07-15 ENCOUNTER — Telehealth: Payer: Self-pay | Admitting: Internal Medicine

## 2021-07-15 ENCOUNTER — Telehealth: Payer: Self-pay

## 2021-07-15 DIAGNOSIS — I421 Obstructive hypertrophic cardiomyopathy: Secondary | ICD-10-CM | POA: Insufficient documentation

## 2021-07-15 LAB — ECHOCARDIOGRAM COMPLETE
Area-P 1/2: 2.18 cm2
S' Lateral: 2.7 cm

## 2021-07-15 NOTE — Telephone Encounter (Signed)
-----   Message from Etter Sjogren, [place] sent at 07/15/2021 11:20 AM EDT ----- ?Regarding: Camzyo's side effect ?Good morning! Can you give Ms. Kylie Wright a call in regards to insurance and possible side effects with Camzyo? She said she lost her insurance and need financial assistance. Also, she has been losing A LOT of hair since taking the drug. Please give her a call and see what you guys can do. Thanks! ? ?Billy ? ?

## 2021-07-15 NOTE — Telephone Encounter (Signed)
Called Patient ?- she has improved LVOT gradient ?- She has preserved LV function ?- she is able to walk more and go up and down stairs ?- she has noted significant hair loss since her starting mavacamten, no other changes save for starting a part time job. ? ?Assessment ?- improved LVOT Gradient but with loss of access to CMI and potential side effect (hair loss) ? ? ?Plan  ?- we will stop mavacamten 5 mg ?- we have reached out to industry about this possible side effect (not reported in initial trials) ?- we have reached out to financial assistance to see if she would be a candidate for financial aid if we were to restart this medication. ?- we have plans to meet in late May (follow up visit) ?- based on this may attempt to refer to SRT (unless she gets Medicaid I worry financial barriers will be too significant) ? ?Patient had no further questions. ? ?Rudean Haskell, MD ?Cardiologist ?[facility]  ?[place], [place] ?[place], [place] [address] ?[phone]  ?10:06 PM ? ?

## 2021-07-15 NOTE — Telephone Encounter (Signed)
Called pt in regards to loss of insurance and side effect of medication.  Pt reports started taking Camzyos 5 mg PO on 06/19/21.  Around 07/02/21 pt began to notice increased hair loss.  Reports normally only has to clean out hair brush every 2 weeks but since starting medication cleans out about every other day.  Pt has started to put hair in ziploc bag.  Denies starting any new medications, no change to diet or hair care routine. Pt expresses does not want to stop taking medication.  Before starting medication could not take a few steps without becoming SOB.  Was recently able to walk up and down 3 flights of steps and walk a mile without having to stop multiple times.  Pt expresses feels so much better.   ?Advised pt that I would reach out to [facility] representative to inform that pt no longer has insurance.  Only has medicaid family planning.  ?

## 2021-07-17 ENCOUNTER — Other Ambulatory Visit: Payer: Self-pay | Admitting: Internal Medicine

## 2021-07-20 ENCOUNTER — Telehealth: Payer: Self-pay

## 2021-07-20 ENCOUNTER — Ambulatory Visit: Payer: BC Managed Care – PPO | Admitting: Internal Medicine

## 2021-07-20 NOTE — Telephone Encounter (Signed)
Okay 

## 2021-08-12 ENCOUNTER — Other Ambulatory Visit (HOSPITAL_COMMUNITY): Payer: BC Managed Care – PPO

## 2021-08-13 ENCOUNTER — Encounter: Payer: Self-pay | Admitting: Internal Medicine

## 2021-08-13 ENCOUNTER — Ambulatory Visit (INDEPENDENT_AMBULATORY_CARE_PROVIDER_SITE_OTHER): Payer: Self-pay | Admitting: Internal Medicine

## 2021-08-13 VITALS — BP 136/64 | HR 64 | Ht 63.0 in | Wt 196.2 lb

## 2021-08-13 DIAGNOSIS — I421 Obstructive hypertrophic cardiomyopathy: Secondary | ICD-10-CM

## 2021-08-13 MED ORDER — DISOPYRAMIDE PHOSPHATE 100 MG PO CAPS: 100.0000 mg | ORAL_CAPSULE | Freq: Four times a day (QID) | ORAL | 3 refills | Status: DC

## 2021-08-13 NOTE — Patient Instructions (Signed)
Medication Instructions:  Your physician has recommended you make the following change in your medication:  START: disopyramide (Norpace) 100 mg by mouth every 6 hours  Please use [facility] for this medication   *If you need a refill on your cardiac medications before your next appointment, please call your pharmacy*   Lab Work: NONE If you have labs (blood work) drawn today and your tests are completely normal, you will receive your results only by: [place] (if you have MyChart) OR A paper copy in the mail If you have any lab test that is abnormal or we need to change your treatment, we will call you to review the results.   Testing/Procedures: EKG 1 week after starting disopyramide    Follow-Up: At [facility], you and your health needs are our priority.  As part of our continuing mission to provide you with exceptional heart care, we have created designated Provider Care Teams.  These Care Teams include your primary Cardiologist (physician) and Advanced Practice Providers (APPs -  Physician Assistants and Nurse Practitioners) who all work together to provide you with the care you need, when you need it.  We recommend signing up for the patient portal called "MyChart".  Sign up information is provided on this After Visit Summary.  MyChart is used to connect with patients for Virtual Visits (Telemedicine).  Patients are able to view lab/test results, encounter notes, upcoming appointments, etc.  Non-urgent messages can be sent to your provider as well.   To learn more about what you can do with MyChart, go to [email or web address].    Your next appointment:   3 -4 month(s)  The format for your next appointment:   In Person  Provider:   Rudean Haskell, MD  Important Information About Sugar

## 2021-08-13 NOTE — Progress Notes (Signed)
Cardiology Office Note:    Date:  08/13/2021   ID:  Kylie Wright, DOB 1965/08/13, MRN [id number]  PCP:  Binnie Rail, MD   [facility] Providers Cardiologist:  Janina Mayo, MD     Referring MD: Binnie Rail, MD   CC: HCM follow up  History of Present Illness:    Kylie Wright is a 56 y.o. female with a hx of HOCM seen by Dr. Harl Bowie and Caryl Comes who presents for initiation of mavacamten.  Seen in follow up after changes led to [facility] discontinuation.  Post mavacamten start, patient had improved symptoms.  No chest pain.  She was able to walk [facility] with no issues.  No SOB.  NYHA I on therapy. She noticed handfuls of hair falling out on mavacamten; this stopped after stopping the medication.  We stopped the medication for this reason but also because she lost her insurance.  She is pending [place] assist and disability.  Patient notes  SOB at rest and  DOE with activity since stopping the medication Notes worsened fatigue. Notes mild CP. Notes no Dizziness. Notes no syncope.   Past Medical History:  Diagnosis Date   ADHD (attention deficit hyperactivity disorder)    Bipolar 1 disorder ([place])    History of alcohol abuse    History of drug abuse ([place])    Interstitial cystitis     Past Surgical History:  Procedure Laterality Date   ADENOIDECTOMY  1971   APPENDECTOMY     SALPINGECTOMY  1993   vaginal surgery      Current Medications: Current Meds  Medication Sig   amphetamine-dextroamphetamine (ADDERALL XR) 20 MG 24 hr capsule Take 20 mg by mouth every morning.   disopyramide (NORPACE) 100 MG capsule Take 1 capsule (100 mg total) by mouth 4 (four) times daily.   hydrochlorothiazide (HYDRODIURIL) 50 MG tablet Take 1 tablet (50 mg total) by mouth every morning.   latanoprost (XALATAN) 0.005 % ophthalmic solution Place 0.024 drops into both eyes at bedtime.   loratadine (CLARITIN) 10 MG tablet Take 10 mg by mouth daily as needed for allergies.    Melatonin 10 MG TABS Take by mouth.   meloxicam (MOBIC) 7.5 MG tablet TAKE 1 TABLET BY MOUTH TWICE  DAILY AS NEEDED FOR PAIN   nebivolol (BYSTOLIC) 10 MG tablet Take 1 tablet (10 mg total) by mouth daily.   OLANZapine-Samidorphan 5-10 MG TABS Take by mouth.   Potassium 99 MG TABS Take by mouth.   rosuvastatin (CRESTOR) 10 MG tablet Take 1 tablet (10 mg total) by mouth daily.   timolol (TIMOPTIC) 0.5 % ophthalmic solution SMARTSIG:In Eye(s)   VESICARE 10 MG tablet Take 10 mg by mouth daily.     Allergies:   Patient has no known allergies.   Social History   Socioeconomic History   Marital status: Single    Spouse name: Not on file   Number of children: 1   Years of education: Not on file   Highest education level: Not on file  Occupational History    Employer: [employer]  Tobacco Use   Smoking status: Every Day    Packs/day: 0.50    Types: Cigarettes   Smokeless tobacco: Never  Substance and Sexual Activity   Alcohol use: No    Alcohol/week: 0.0 standard drinks   Drug use: No    Comment: h/o abuse hasn't used since 2002   Sexual activity: Not Currently    Partners: Male  Birth control/protection: None  Other Topics Concern   Not on file  Social History Narrative   Exercise:  Walks a lot, tries to [place]    Social Determinants of Health   Financial Resource Strain: Not on file  Food Insecurity: Not on file  Transportation Needs: Not on file  Physical Activity: Not on file  Stress: Not on file  Social Connections: Not on file    Social: Formerly very active- worked at [employer], worked in Librarian, academic, 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  ROS:   Please see the history of present illness.     All other systems reviewed and are negative.  EKGs/Labs/Other Studies Reviewed:    The following studies were reviewed today:  Transthoracic Echocardiogram: Date: 10/16/20 Results:  1. Left ventricular ejection fraction, by estimation, is 60 to 65%. The  left ventricle has normal function. The left ventricle has no regional  wall motion abnormalities. There is moderate left ventricular hypertrophy.  Left ventricular diastolic  parameters are consistent with Grade I diastolic dysfunction (impaired  relaxation).   2. Right ventricular systolic function is normal. The right ventricular  size is normal.   3. Left atrial size was moderately dilated.   4. The mitral valve is abnormal. Trivial mitral valve regurgitation. No  evidence of mitral stenosis.   5. Appears to be SAM with bifid CW across AV suggesting HOCM with peak  gradient at rest 39 mmHg Consider f/u TTE to confirm sub valvular gradient  and mechanism . The aortic valve was not well visualized. There is mild  calcification of the aortic valve.  Aortic valve regurgitation is not visualized. Mild aortic valve sclerosis  is present, with no evidence of aortic valve stenosis.   6. The inferior vena cava is normal in size with greater than 50%  respiratory variability, suggesting right atrial pressure of 3 mmHg.     Recent Labs: 03/26/2021: ALT 15; BUN 14; Creatinine, Ser 1.00; Hemoglobin 13.4; Platelets 192.0; Potassium 4.0; Sodium 134; TSH 4.08  Recent Lipid Panel    Component Value Date/Time   CHOL 149 03/26/2021 1102   TRIG 158.0 (H) 03/26/2021 1102   HDL 41.70 03/26/2021 1102   CHOLHDL 4 03/26/2021 1102   VLDL 31.6 03/26/2021 1102   LDLCALC 75 03/26/2021 1102        Physical Exam:    VS:  BP 136/64   Pulse 64   Ht '5\' 3"'$  (1.6 m)   Wt 196 lb  3.2 oz (89 kg)   SpO2 95%   BMI 34.76 kg/m     Wt Readings from Last 3 Encounters:  08/13/21 196 lb 3.2 oz (89 kg)  04/30/21 193 lb 14.4 oz (88 kg)  04/16/21 193 lb (87.5 kg)     Gen: no distress Neck: No JVD,  Cardiac: No Rubs or Gallops, harsh systolic murmur worse with standing, RRR and +2 radial pulses Respiratory: Clear to auscultation bilaterally, normal effort, normal  respiratory rate GI: Soft, nontender, non-distended  MS: No  edema;  moves all extremities Integument: Skin feels well Neuro:  At time of evaluation, alert and oriented to person/place/time/situation  Psych: Normal affect, patient feels dejected   ASSESSMENT:    1. HOCM (hypertrophic obstructive cardiomyopathy) (HCC)     PLAN:    Hypertrophic Cardiomyopathy - LVOT gradien 63 off of medication - NYHA III - she is tolerating nebivolol 10 mg, has needed HCTZ to maintain euvolemia could not do dual AV nodal therapy - she does not have insurance which greatly impacts our ability to optimize her care  - We will attempts disopyramide 100 mg PO q6hr, we have discussed vagolytic side effects and cost - we will get an EKG in one week to review QTc post disopyramide start - if this fails we have offered to re-trial her on mavacamten through charity care and have discussed the risks of hair loss - we will reach out to see if patient would be a trial candidate for aficamten; we would also reach out to [facility] clinic to see if she would be a candidate for clinical trials - if she gains insurance, and does not want to re-try [facility], we will send to [facility] for consideration of SRT - if she is unable to gain insurance, but has [place] card, she may be a candidate for [employer] ASA, will need multi-disciplinary team approach (will reach out to Dr. Ali Lowe)  If significant CP, consented for LHC both for CAD eval but also to eval anatomy for Alocohol Septal Ablation  Time Spent Directly with Patient:   I have  spent a total of 60 minutes with the patient reviewing notes, imaging, EKGs, labs and examining the patient as well as establishing an assessment and plan that was discussed personally with the patient.  > 50% of time was spent in direct patient care reviewing options for therapy, and reaching out to outside centers and industry for potential interventions.  3-4 months with me EKG next week        Medication Adjustments/Labs and Tests Ordered: Current medicines are reviewed at length with the patient today.  Concerns regarding medicines are outlined above.  No orders of the defined types were placed in this encounter.  Meds ordered this encounter  Medications   disopyramide (NORPACE) 100 MG capsule    Sig: Take 1 capsule (100 mg total) by mouth 4 (four) times daily.    Dispense:  360 capsule    Refill:  3    Patient Instructions  Medication Instructions:  Your physician has recommended you make the following change in your medication:  START: disopyramide (Norpace) 100 mg by mouth every 6 hours  Please use [facility] for this medication   *If you need a refill on your cardiac medications before your next appointment, please call your pharmacy*   Lab Work: NONE If you have labs (blood work) drawn today and your tests are completely normal, you will receive your results only by: [place] (if you have MyChart) OR A paper copy in the mail If you have any lab test that is abnormal or we need to change your treatment, we will call you to review the results.   Testing/Procedures: EKG 1 week after starting disopyramide    Follow-Up: At [facility], you and your health needs are our priority.  As part of our continuing mission to provide you with exceptional heart care, we have created designated Provider Care Teams.  These Care Teams include your primary Cardiologist (physician) and Advanced Practice Providers (APPs -  Physician Assistants and Nurse Practitioners) who all  work together to provide you with the care you need, when you need it.  We recommend signing up for the patient portal called "MyChart".  Sign up information is provided on this After Visit Summary.  MyChart is used to connect with patients for Virtual Visits (Telemedicine).  Patients are able to view lab/test results, encounter notes, upcoming appointments, etc.  Non-urgent messages can be sent to your provider as well.   To learn more about what you can do with MyChart, go to [email or web address].    Your next appointment:   3 -4 month(s)  The format for your next appointment:   In Person  Provider:   Rudean Haskell, MD  Important Information About Sugar         Signed, Werner Lean, MD  08/13/2021 10:14 AM    [place]

## 2021-08-14 ENCOUNTER — Encounter: Payer: Self-pay | Admitting: Internal Medicine

## 2021-08-23 ENCOUNTER — Telehealth: Payer: Self-pay | Admitting: Internal Medicine

## 2021-08-23 ENCOUNTER — Ambulatory Visit (INDEPENDENT_AMBULATORY_CARE_PROVIDER_SITE_OTHER): Payer: Self-pay | Admitting: *Deleted

## 2021-08-23 VITALS — HR 45 | Ht 63.0 in | Wt 195.8 lb

## 2021-08-23 DIAGNOSIS — R001 Bradycardia, unspecified: Secondary | ICD-10-CM

## 2021-08-23 DIAGNOSIS — I421 Obstructive hypertrophic cardiomyopathy: Secondary | ICD-10-CM

## 2021-08-23 DIAGNOSIS — Z79899 Other long term (current) drug therapy: Secondary | ICD-10-CM

## 2021-08-23 NOTE — Progress Notes (Signed)
Pt here for EKG  to monitor QT Pt started Disopyramide on 08/17/21 Dr Gasper Sells reviewed no changes at this time Pt to continue medicines as directed .Pt to call if develops any side effects /cy

## 2021-08-23 NOTE — Telephone Encounter (Signed)
EKG done today.   Feeling better on the medication today. Feels tired. [facility] is not presenting enrolling, but may be in the future. [facility] has limited options without insurance.  No Second or third-degree AV block  No long QT syndromes: disopyramide and other class Ia antiarrhythmics can prolong the QT interval.  Rudean Haskell, MD Cardiologist Structural Imaging/Hypertrophic Cardiomyopathy [place]  [facility]  [address] [place], [place] [address] [phone]  11:28 AM

## 2021-09-08 MED ORDER — DISOPYRAMIDE PHOSPHATE 100 MG PO CAPS: 100.0000 mg | ORAL_CAPSULE | Freq: Four times a day (QID) | ORAL | 3 refills | Status: DC

## 2021-09-09 ENCOUNTER — Other Ambulatory Visit (HOSPITAL_COMMUNITY): Payer: BC Managed Care – PPO

## 2021-10-07 ENCOUNTER — Encounter: Payer: Self-pay | Admitting: Internal Medicine

## 2021-10-07 NOTE — Progress Notes (Signed)
Subjective:    Patient ID: Kylie Wright, female    DOB: November 12, 1965, 56 y.o.   MRN: [id number]     HPI Kylie Wright is here for follow up of her chronic medical problems, including HOCM, htn, hld, prediabetes, foot OA, tobacco abuse  Working on disability.  She is working part-time.    She is taking her meds daily.  She does have some shortness of breath.  We discussed her recent events with cardiology.  Medications and allergies reviewed with patient and updated if appropriate.  Current Outpatient Medications on File Prior to Visit  Medication Sig Dispense Refill   amphetamine-dextroamphetamine (ADDERALL XR) 20 MG 24 hr capsule Take 20 mg by mouth every morning.  0   disopyramide (NORPACE) 100 MG capsule Take 1 capsule (100 mg total) by mouth 4 (four) times daily. 360 capsule 3   hydrochlorothiazide (HYDRODIURIL) 50 MG tablet Take 1 tablet (50 mg total) by mouth every morning. 90 tablet 1   latanoprost (XALATAN) 0.005 % ophthalmic solution Place 0.024 drops into both eyes at bedtime.     loratadine (CLARITIN) 10 MG tablet Take 10 mg by mouth daily as needed for allergies.     Melatonin 10 MG TABS Take by mouth.     meloxicam (MOBIC) 7.5 MG tablet TAKE 1 TABLET BY MOUTH TWICE  DAILY AS NEEDED FOR PAIN 180 tablet 3   nebivolol (BYSTOLIC) 10 MG tablet Take 1 tablet (10 mg total) by mouth daily. 90 tablet 1   OLANZapine-Samidorphan 5-10 MG TABS Take by mouth.     Olopatadine HCl (PAZEO) 0.7 % SOLN PLACE 1 DROP INTO BOTH EYES EVERY MORNING     Potassium 99 MG TABS Take by mouth.     rosuvastatin (CRESTOR) 10 MG tablet Take 1 tablet (10 mg total) by mouth daily. 90 tablet 2   timolol (TIMOPTIC) 0.5 % ophthalmic solution SMARTSIG:In Eye(s)     VESICARE 10 MG tablet Take 10 mg by mouth daily.  3   No current facility-administered medications on file prior to visit.     Review of Systems  Constitutional:  Negative for fever.  Respiratory:  Positive for shortness of breath. Negative  for cough and wheezing.   Cardiovascular:  Positive for chest pain (occ randomly). Negative for palpitations and leg swelling.  Neurological:  Positive for light-headedness. Negative for headaches.       Objective:   Vitals:   10/08/21 1006  BP: 132/72  Pulse: (!) 58  Temp: 98 F (36.7 C)  SpO2: 99%   BP Readings from Last 3 Encounters:  10/08/21 132/72  08/13/21 136/64  04/30/21 128/80   Wt Readings from Last 3 Encounters:  10/08/21 193 lb 6.4 oz (87.7 kg)  08/23/21 195 lb 12.8 oz (88.8 kg)  08/13/21 196 lb 3.2 oz (89 kg)   Body mass index is 34.26 kg/m.    Physical Exam Constitutional:      General: She is not in acute distress.    Appearance: Normal appearance.  HENT:     Head: Normocephalic and atraumatic.  Eyes:     Conjunctiva/sclera: Conjunctivae normal.  Cardiovascular:     Rate and Rhythm: Normal rate and regular rhythm.     Heart sounds: Normal heart sounds. No murmur heard. Pulmonary:     Effort: Pulmonary effort is normal. No respiratory distress.     Breath sounds: Normal breath sounds. No wheezing.  Musculoskeletal:     Cervical back: Neck supple.  Right lower leg: No edema.     Left lower leg: No edema.  Lymphadenopathy:     Cervical: No cervical adenopathy.  Skin:    General: Skin is warm and dry.     Findings: No rash.  Neurological:     Mental Status: She is alert. Mental status is at baseline.  Psychiatric:        Mood and Affect: Mood normal.        Behavior: Behavior normal.        Lab Results  Component Value Date   WBC 13.3 (H) 03/26/2021   HGB 13.4 03/26/2021   HCT 40.2 03/26/2021   PLT 192.0 03/26/2021   GLUCOSE 108 (H) 03/26/2021   CHOL 149 03/26/2021   TRIG 158.0 (H) 03/26/2021   HDL 41.70 03/26/2021   LDLCALC 75 03/26/2021   ALT 15 03/26/2021   AST 22 03/26/2021   NA 134 (L) 03/26/2021   K 4.0 03/26/2021   CL 103 03/26/2021   CREATININE 1.00 03/26/2021   BUN 14 03/26/2021   CO2 24 03/26/2021   TSH 4.08  03/26/2021   HGBA1C 5.8 03/26/2021     Assessment & Plan:    See Problem List for Assessment and Plan of chronic medical problems.

## 2021-10-07 NOTE — Patient Instructions (Addendum)
     Blood work was ordered.     Medications changes include :   none   Your prescription(s) have been sent to your pharmacy.     Return in about 6 months (around 04/10/2022) for Physical Exam.

## 2021-10-08 ENCOUNTER — Ambulatory Visit (INDEPENDENT_AMBULATORY_CARE_PROVIDER_SITE_OTHER): Payer: Self-pay | Admitting: Internal Medicine

## 2021-10-08 VITALS — BP 132/72 | HR 58 | Temp 98.0°F | Ht 63.0 in | Wt 193.4 lb

## 2021-10-08 DIAGNOSIS — I10 Essential (primary) hypertension: Secondary | ICD-10-CM

## 2021-10-08 DIAGNOSIS — M19071 Primary osteoarthritis, right ankle and foot: Secondary | ICD-10-CM

## 2021-10-08 DIAGNOSIS — E7849 Other hyperlipidemia: Secondary | ICD-10-CM

## 2021-10-08 DIAGNOSIS — I421 Obstructive hypertrophic cardiomyopathy: Secondary | ICD-10-CM

## 2021-10-08 DIAGNOSIS — M19072 Primary osteoarthritis, left ankle and foot: Secondary | ICD-10-CM

## 2021-10-08 DIAGNOSIS — R7303 Prediabetes: Secondary | ICD-10-CM

## 2021-10-08 LAB — CBC WITH DIFFERENTIAL/PLATELET
Basophils Absolute: 0.1 10*3/uL (ref 0.0–0.1)
Basophils Relative: 0.5 % (ref 0.0–3.0)
Eosinophils Absolute: 0.2 10*3/uL (ref 0.0–0.7)
Eosinophils Relative: 2.1 % (ref 0.0–5.0)
HCT: 39.3 % (ref 36.0–46.0)
Hemoglobin: 13.2 g/dL (ref 12.0–15.0)
Lymphocytes Relative: 31.5 % (ref 12.0–46.0)
Lymphs Abs: 3.7 10*3/uL (ref 0.7–4.0)
MCHC: 33.7 g/dL (ref 30.0–36.0)
MCV: 96.3 fl (ref 78.0–100.0)
Monocytes Absolute: 0.7 10*3/uL (ref 0.1–1.0)
Monocytes Relative: 6.2 % (ref 3.0–12.0)
Neutro Abs: 7.1 10*3/uL (ref 1.4–7.7)
Neutrophils Relative %: 59.7 % (ref 43.0–77.0)
Platelets: 169 10*3/uL (ref 150.0–400.0)
RBC: 4.08 Mil/uL (ref 3.87–5.11)
RDW: 14.5 % (ref 11.5–15.5)
WBC: 11.9 10*3/uL — ABNORMAL HIGH (ref 4.0–10.5)

## 2021-10-08 LAB — COMPREHENSIVE METABOLIC PANEL
ALT: 12 U/L (ref 0–35)
AST: 20 U/L (ref 0–37)
Albumin: 4.3 g/dL (ref 3.5–5.2)
Alkaline Phosphatase: 87 U/L (ref 39–117)
BUN: 20 mg/dL (ref 6–23)
CO2: 26 mEq/L (ref 19–32)
Calcium: 8.8 mg/dL (ref 8.4–10.5)
Chloride: 99 mEq/L (ref 96–112)
Creatinine, Ser: 1.22 mg/dL — ABNORMAL HIGH (ref 0.40–1.20)
GFR: 49.48 mL/min — ABNORMAL LOW (ref >60.00)
Glucose, Bld: 107 mg/dL — ABNORMAL HIGH (ref 70–99)
Potassium: 3.5 mEq/L (ref 3.5–5.1)
Sodium: 133 mEq/L — ABNORMAL LOW (ref 135–145)
Total Bilirubin: 0.6 mg/dL (ref 0.2–1.2)
Total Protein: 8.2 g/dL (ref 6.0–8.3)

## 2021-10-08 LAB — LIPID PANEL
Cholesterol: 153 mg/dL (ref 0–200)
HDL: 37 mg/dL — ABNORMAL LOW (ref >39.00)
LDL Cholesterol: 79 mg/dL (ref 0–99)
NonHDL: 116.08
Total CHOL/HDL Ratio: 4
Triglycerides: 185 mg/dL — ABNORMAL HIGH (ref 0.0–149.0)
VLDL: 37 mg/dL (ref 0.0–40.0)

## 2021-10-08 LAB — HEMOGLOBIN A1C: Hgb A1c MFr Bld: 5.8 % (ref 4.6–6.5)

## 2021-10-08 MED ORDER — NEBIVOLOL HCL 10 MG PO TABS: 10.0000 mg | ORAL_TABLET | Freq: Every day | ORAL | 1 refills | Status: DC

## 2021-10-08 MED ORDER — MELOXICAM 7.5 MG PO TABS: ORAL_TABLET | ORAL | 3 refills | Status: DC

## 2021-10-08 MED ORDER — ROSUVASTATIN CALCIUM 10 MG PO TABS: 10.0000 mg | ORAL_TABLET | Freq: Every day | ORAL | 2 refills | Status: DC

## 2021-10-08 NOTE — Assessment & Plan Note (Signed)
Chronic Regular exercise and healthy diet encouraged Continue Crestor 10 mg daily

## 2021-10-08 NOTE — Assessment & Plan Note (Signed)
Chronic Check a1c Low sugar / carb diet Stressed regular exercise  

## 2021-10-08 NOTE — Assessment & Plan Note (Signed)
Chronic Continue meloxicam 7.5 mg twice daily as needed-encouraged her to limit this is much as possible because of possible side effects

## 2021-10-08 NOTE — Assessment & Plan Note (Addendum)
Chronic Following with cardiology Limited and treatment because of insurance Currently on disopyramide

## 2021-10-08 NOTE — Assessment & Plan Note (Signed)
Chronic Blood pressure well controlled CMP Continue HCTZ 50 mg daily, Bystolic 10 mg daily

## 2021-10-27 ENCOUNTER — Telehealth: Payer: Self-pay

## 2021-10-27 NOTE — Telephone Encounter (Signed)
Faxed today

## 2021-10-27 NOTE — Telephone Encounter (Signed)
Pt is asking that her lab work be faxed over to her Psych doctor Eino Farber at [facility].   Fax: [phone].

## 2021-10-28 ENCOUNTER — Telehealth: Payer: Self-pay | Admitting: Internal Medicine

## 2021-10-28 NOTE — Telephone Encounter (Signed)
Called pt who reports was told by disability determination that our practice reports we have no information about this patient.  Advised pt  Dr. Gasper Sells has not received any disability paperwork.  Advised pt to contact primary Cardiologist Dr. Harl Bowie to see if they received paperwork.  Pt advised she would call to inquire.

## 2021-10-28 NOTE — Telephone Encounter (Signed)
Pt would like a callback regarding Medical Records for Disability. Please advise

## 2021-11-13 NOTE — Progress Notes (Unsigned)
Cardiology Office Note:    Date:  11/15/2021   ID:  Kylie Wright, DOB [birth year], MRN [id number]  PCP:  Kylie Rail, MD   [facility] Providers Cardiologist:  Kylie Lean, MD     Referring MD: Kylie Rail, MD   CC: HCM follow up  History of Present Illness:    Kylie Wright is a 56 y.o. female with a hx of HOCM seen by Dr. Harl Wright and Kylie Wright who presents for initiation of mavacamten.  Seen in follow up after changes led to [facility] discontinuation. 2023:  Post mavacamten start, patient had improved symptoms.    NYHA I on therapy. She noticed handfuls of hair falling out on mavacamten; this stopped after stopping the medication.  We stopped the medication for this reason but also because she lost her insurance.  She is pending [place] assist and disability.  We started disopyramide with improved symptoms.  She is waiting disability insurance  Patient notes improved SOB at rest and DOE on disopyramide. She notes dry mouth and urinary retention on therapy. Notes slightly improved fatigue. Notes  palpitations; she is previously did not know what palpitations were. Notes no CP. Notes  Dizziness. Notes no syncope. She has one son; no family events  She was denied [facility] after applications.   Past Medical History:  Diagnosis Date   ADHD (attention deficit hyperactivity disorder)    Bipolar 1 disorder ([place])    History of alcohol abuse    History of drug abuse ([place])    Interstitial cystitis     Past Surgical History:  Procedure Laterality Date   ADENOIDECTOMY  1971   APPENDECTOMY     SALPINGECTOMY  1993   vaginal surgery      Current Medications: Current Meds  Medication Sig   amphetamine-dextroamphetamine (ADDERALL XR) 20 MG 24 hr capsule Take 20 mg by mouth every morning.   disopyramide (NORPACE) 100 MG capsule Take 1 capsule (100 mg total) by mouth 4 (four) times daily.   hydrochlorothiazide (HYDRODIURIL) 50 MG tablet Take 1 tablet (50 mg  total) by mouth every morning.   latanoprost (XALATAN) 0.005 % ophthalmic solution Place 0.024 drops into both eyes at bedtime.   loratadine (CLARITIN) 10 MG tablet Take 10 mg by mouth daily as needed for allergies.   Melatonin 10 MG TABS Take by mouth.   meloxicam (MOBIC) 7.5 MG tablet TAKE 1 TABLET BY MOUTH TWICE  DAILY AS NEEDED FOR PAIN   nebivolol (BYSTOLIC) 10 MG tablet Take 1 tablet (10 mg total) by mouth daily.   OLANZapine-Samidorphan 5-10 MG TABS Take by mouth.   Olopatadine HCl (PAZEO) 0.7 % SOLN PLACE 1 DROP INTO BOTH EYES EVERY MORNING   Potassium 99 MG TABS Take by mouth.   rosuvastatin (CRESTOR) 10 MG tablet Take 1 tablet (10 mg total) by mouth daily.   spironolactone (ALDACTONE) 25 MG tablet Take 1 tablet (25 mg total) by mouth daily.   timolol (TIMOPTIC) 0.5 % ophthalmic solution SMARTSIG:In Eye(s)   VESICARE 10 MG tablet Take 10 mg by mouth daily.     Allergies:   Patient has no known allergies.   Social History   Socioeconomic History   Marital status: Single    Spouse name: Not on file   Number of children: 1   Years of education: Not on file   Highest education level: Not on file  Occupational History    Employer: [employer]  Tobacco Use   Smoking  status: Every Day    Packs/day: 0.50    Types: Cigarettes   Smokeless tobacco: Never  Substance and Sexual Activity   Alcohol use: No    Alcohol/week: 0.0 standard drinks of alcohol   Drug use: No    Comment: h/o abuse hasn't used since 2002   Sexual activity: Not Currently    Partners: Male    Birth control/protection: None  Other Topics Concern   Not on file  Social History Narrative   Exercise:  Walks a lot, tries to [place]    Social Determinants of Health   Financial Resource Strain: Not on file  Food Insecurity: Not on file  Transportation Needs: Not on file  Physical Activity: Not on file  Stress: Not on file  Social Connections: Not on file    Social: Formerly very active- worked  at [employer], worked in Librarian, academic, 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  ROS:   Please see the history of present illness.     All other systems reviewed and are negative.  EKGs/Labs/Other Studies Reviewed:    The following studies were reviewed today: 11/15/21: Sinus bradycardia one PAC   EXERCISE TOLERANCE TEST (ETT) 02/26/2021  Narrative   Patient exercised according to the CVN-BRUCE for 3:4mn achieving 4.9 METs consistent with poor exercise capacity   Target HR was not achieved. Max HR was 89 which represents 54% of MAPHR.   No ST deviation was noted, however, study is non-diagnostic due to inability to achieve target HR   Frequent PACs but no PVCs/VT during study   Overall, poor exercise capacity with nondiagnostic study due to inability to achieve target HR. Recommend either coronary CTA vs myoview if clinically indicated.   ECHO COMPLETE WO IMAGING ENHANCING AGENT 07/15/2021  Narrative ECHOCARDIOGRAM REPORT    Patient Name:   Kylie PETEDate of Exam: 07/15/2021 Medical Rec #:  [id number]     Height:       62.0 in Accession #:    [id number]    Weight:       193.9 lb Date of Birth:  912/26/1967      BSA:          1.887 m Patient Age:    89years       BP:           128/80 mmHg Patient Gender: F              HR:           50 bpm. Exam Location:  [facility] Procedure: 2D Echo, 3D Echo, Cardiac Doppler, Color Doppler and Strain Analysis  Indications:    I42.1 HOCM.  History:        Patient has prior history of Echocardiogram  examinations, most recent 05/27/2021. Hypertrophic Cardiomyopathy, Arrythmias:Bradycardia, Signs/Symptoms:Syncope and Murmur; Risk Factors:Hypertension, Dyslipidemia and Current Smoker. H/o EtOH. H/o drug abuse. Pre-diabetes.  Sonographer:    KBasilia JumboBFlorence Surgery Center LP RDCS Referring Phys: [id number] Diego Eye Cor IncA CGasper Sells  Sonographer Comments: [place] start, day 1 06/19/2021. Patient did not have meal before  exam. IMPRESSIONS   1. Left ventricular ejection fraction, by estimation, is 65 to 70%. The left ventricle has hyperdynamic function. The left ventricle has no regional wall motion abnormalities. Severe asymmetric basal septal left ventricular hypertrophy. There is mild systolic anterior motion of the mitral valve. Peak LV outflow tract gradient was 35 mmHg with Valsalva. Left ventricular diastolic parameters are consistent with Grade I diastolic dysfunction (impaired relaxation). The average left ventricular global longitudinal strain is -20.5 %. The global longitudinal strain is normal. 2. Right ventricular systolic function is normal. The right ventricular size is normal. Tricuspid regurgitation signal is inadequate for assessing PA pressure. 3. Left atrial size was mildly dilated. 4. The mitral valve is abnormal with mild systolic anterior motion. Mild mitral valve regurgitation. No evidence of mitral stenosis. 5. The aortic valve is tricuspid. There is mild calcification of the aortic valve. Aortic valve regurgitation is not visualized. No aortic stenosis is present. 6. The inferior vena cava is normal in size with greater than 50% respiratory variability, suggesting right atrial pressure of 3 mmHg.  Comparison(s): Stress echo 05/27/21 EF 70%. LVOT peak gradiant 66mHg.  FINDINGS Left Ventricle: Left ventricular ejection fraction, by estimation, is 65 to 70%. The left ventricle has hyperdynamic function. The left ventricle has no regional wall motion abnormalities. The average left ventricular  global longitudinal strain is -20.5 %. The global longitudinal strain is normal. The left ventricular internal cavity size was normal in size. Severe asymmetric basal septal left ventricular hypertrophy. Left ventricular diastolic parameters are consistent with Grade I diastolic dysfunction (impaired relaxation).  Right Ventricle: The right ventricular size is normal. No increase in right ventricular wall thickness. Right ventricular systolic function is normal. Tricuspid regurgitation signal is inadequate for assessing PA pressure.  Left Atrium: Left atrial size was mildly dilated.  Right Atrium: Right atrial size was normal in size.  Pericardium: There is no evidence of pericardial effusion.  Mitral Valve: The mitral valve is abnormal. Mild mitral annular calcification. Mild mitral valve regurgitation. No evidence of mitral valve stenosis.  Tricuspid Valve: The tricuspid valve is normal in structure. Tricuspid valve regurgitation is not demonstrated.  Aortic Valve: The aortic valve is tricuspid. There is mild calcification of the aortic valve. Aortic valve regurgitation is not visualized. No aortic stenosis is present.  Pulmonic Valve: The pulmonic valve was normal in structure. Pulmonic valve regurgitation is not visualized.  Aorta: The aortic root is normal in size and structure.  Venous: The inferior vena cava is normal in size with greater than 50% respiratory variability, suggesting right atrial pressure of 3 mmHg.  IAS/Shunts: No atrial level shunt detected by color flow Doppler.   LEFT VENTRICLE PLAX 2D LVIDd:         4.10 cm   Diastology LVIDs:         2.70 cm   LV e' medial:    6.73 cm/s LV PW:         1.10 cm   LV E/e' medial:  14.0 LV IVS:        1.10 cm   LV e' lateral:   6.89 cm/s LVOT diam:     2.00 cm   LV E/e' lateral: 13.7 LV SV:         118 LV SV Index:   63        2D Longitudinal Strain LVOT Area:     3.14 cm  2D Strain GLS (A2C):   -20.5 % 2D Strain GLS  (A3C):   -18.4 %  2D Strain GLS (A4C):   -22.5 % 2D Strain GLS Avg:     -20.5 %  3D Volume EF: 3D EF:        58 % LV EDV:       89 ml LV ESV:       37 ml LV SV:        52 ml  RIGHT VENTRICLE             IVC RV Basal diam:  2.40 cm     IVC diam: 1.50 cm RV S prime:     12.10 cm/s TAPSE (M-mode): 1.6 cm  LEFT ATRIUM             Index        RIGHT ATRIUM           Index LA diam:        4.50 cm 2.39 cm/m   RA Pressure: 3.00 mmHg LA Vol (A2C):   26.5 ml 14.05 ml/m  RA Area:     10.30 cm LA Vol (A4C):   43.3 ml 22.95 ml/m  RA Volume:   16.90 ml  8.96 ml/m LA Biplane Vol: 35.3 ml 18.71 ml/m AORTIC VALVE LVOT Vmax:   139.00 cm/s LVOT Vmean:  94.600 cm/s LVOT VTI:    0.377 m  AORTA Ao Root diam: 3.00 cm Ao Asc diam:  3.40 cm  MITRAL VALVE                TRICUSPID VALVE Estimated RAP:  3.00 mmHg MV Decel Time: 348 msec MV E velocity: 94.30 cm/s   SHUNTS MV A velocity: 120.00 cm/s  Systemic VTI:  0.38 m MV E/A ratio:  0.79         Systemic Diam: 2.00 cm    LONG TERM MONITOR (3-7 DAYS) INTERPRETATION 02/09/2021  Narrative  Abnormal study  Agree with findings Predominant sinus rhythm. Episodes of a junctional rhythm. Symptomatic bradycardia with heart rates in the 40s in the afternoon. Ectopic atrial rhythm.  4 beats of NSVT. No sustained VT.   Patch Wear Time:  7 days and 1 hours (2022-11-08T18:38:17-0500 to 2022-11-15T19:58:02-499)  Patient had a min HR of 37 bpm, max HR of 162 bpm, and avg HR of 51 bpm. Predominant underlying rhythm was Sinus Rhythm. Slight P wave morphology changes were noted. 1 run of Ventricular Tachycardia occurred lasting 4 beats with a max rate of 162 bpm (avg 155 bpm). Ectopic Atrial Rhythm was present. Junctional Rhythm was present. Ventricular Tachycardia, Ectopic Atrial Rhythm and Junctional Rhythm were detected within +/- 45 seconds of symptomatic patient event(s). Isolated SVEs were occasional (1.1%, 5867), SVE Couplets were rare (<1.0%,  148), and SVE Triplets were rare (<1.0%, 2). Isolated VEs were rare (<1.0%), and no VE Couplets or VE Triplets were present.   Recent Labs: 03/26/2021: TSH 4.08 10/08/2021: ALT 12; BUN 20; Creatinine, Ser 1.22; Hemoglobin 13.2; Platelets 169.0; Potassium 3.5; Sodium 133  Recent Lipid Panel    Component Value Date/Time   CHOL 153 10/08/2021 1114   TRIG 185.0 (H) 10/08/2021 1114   HDL 37.00 (L) 10/08/2021 1114   CHOLHDL 4 10/08/2021 1114   VLDL 37.0 10/08/2021 1114   LDLCALC 79 10/08/2021 1114        Physical Exam:    VS:  BP (!) 154/87   Pulse (!) 48   Ht '5\' 3"'$  (1.6 m)   Wt 195 lb (88.5 kg)   SpO2 99%   BMI 34.54 kg/m  Wt Readings from Last 3 Encounters:  11/15/21 195 lb (88.5 kg)  10/08/21 193 lb 6.4 oz (87.7 kg)  08/23/21 195 lb 12.8 oz (88.8 kg)    Gen: No distress    Neck: No JVD Cardiac: No Rubs or Gallops, harsh systolic murmur, RRR +2 radial pulses Respiratory: Clear to auscultation bilaterally, normal effort, normal  respiratory rate GI: Soft, nontender, non-distended  MS: No  edema;  moves all extremities Integument: Skin feels well Neuro:  At time of evaluation, alert and oriented to person/place/time/situation  Psych: Normal affect, patient feels in good spirits  ASSESSMENT:    1. Pre-procedure lab exam   2. Hypertension, unspecified type     PLAN:    Hypertrophic Cardiomyopathy Complicated by HTN  Complicated by no insurance - NYHA III - severe LVOT gradient - she is no nebivolol 10 mg and HCTZ 50 mg; I would like to bring her down to metoprolol and aldactone with BP control; will start aldactone 25 mg with goals of titrating up; decreasing HCTZ and potassium, and eventually switching to metoprolol - she cannot tolerate higher doses of dispyramide 100 mg PO q6hr because of vagolytic symptoms (dry mouth and urinate retention) - had hair loss with mavacamten - will need Septal reduction therapy - we are working to find support for her and her care  she is also pending insurance - when she is able to afford or with extra financial support, she will need CMR (scar burden of septum), LHC ([place]) and TEE (me) - after discussion we will then have her see [facility] or potential stay here for SRT - she has found a lot of benefit with the [facility] information we sent and joined an only support group   Three to four months with me unless she has insurance in which case we may be abel to inact our therapeutic plan sooner  Time Spent Directly with Patient:   I have spent a total of 40 minutes with the patient reviewing notes, imaging, EKGs, labs and examining the patient as well as establishing an assessment and plan that was discussed personally with the patient.  > 50% of time was spent in direct patient care and.             Medication Adjustments/Labs and Tests Ordered: Current medicines are reviewed at length with the patient today.  Concerns regarding medicines are outlined above.  Orders Placed This Encounter  Procedures   EKG 12-Lead   Meds ordered this encounter  Medications   spironolactone (ALDACTONE) 25 MG tablet    Sig: Take 1 tablet (25 mg total) by mouth daily.    Dispense:  90 tablet    Refill:  3    Patient Instructions  Medication Instructions:  Your physician has recommended you make the following change in your medication:  Spironolactone '25mg'$  daily  *If you need a refill on your cardiac medications before your next appointment, please call your pharmacy*   Lab Work: Bmet 1-2 weeks with Dr. Quay Burow office  If you have labs (blood work) drawn today and your tests are completely normal, you will receive your results only by: [place] (if you have MyChart) OR A paper copy in the mail If you have any lab test that is abnormal or we need to change your treatment, we will call you to review the results.   Follow-Up: At [facility], you and your health needs are our priority.  As part of our  continuing mission to provide  you with exceptional heart care, we have created designated Provider Care Teams.  These Care Teams include your primary Cardiologist (physician) and Advanced Practice Providers (APPs -  Physician Assistants and Nurse Practitioners) who all work together to provide you with the care you need, when you need it.  Your next appointment:   3 month(s)  The format for your next appointment:   In Person  Provider:   Werner Lean, MD             Signed, Kylie Lean, MD  11/15/2021 10:14 AM    [place]

## 2021-11-15 ENCOUNTER — Telehealth (HOSPITAL_COMMUNITY): Payer: Self-pay | Admitting: Licensed Clinical Social Worker

## 2021-11-15 ENCOUNTER — Encounter: Payer: Self-pay | Admitting: Internal Medicine

## 2021-11-15 ENCOUNTER — Ambulatory Visit: Payer: Self-pay | Attending: Internal Medicine | Admitting: Internal Medicine

## 2021-11-15 VITALS — BP 154/87 | HR 48 | Ht 63.0 in | Wt 195.0 lb

## 2021-11-15 DIAGNOSIS — I10 Essential (primary) hypertension: Secondary | ICD-10-CM

## 2021-11-15 DIAGNOSIS — Z01812 Encounter for preprocedural laboratory examination: Secondary | ICD-10-CM

## 2021-11-15 MED ORDER — SPIRONOLACTONE 25 MG PO TABS: 25.0000 mg | ORAL_TABLET | Freq: Every day | ORAL | 3 refills | Status: DC

## 2021-11-15 NOTE — Patient Instructions (Signed)
Medication Instructions:  Your physician has recommended you make the following change in your medication:  Spironolactone '25mg'$  daily  *If you need a refill on your cardiac medications before your next appointment, please call your pharmacy*   Lab Work: Bmet 1-2 weeks with Dr. Quay Burow office  If you have labs (blood work) drawn today and your tests are completely normal, you will receive your results only by: [place] (if you have MyChart) OR A paper copy in the mail If you have any lab test that is abnormal or we need to change your treatment, we will call you to review the results.   Follow-Up: At [facility], you and your health needs are our priority.  As part of our continuing mission to provide you with exceptional heart care, we have created designated Provider Care Teams.  These Care Teams include your primary Cardiologist (physician) and Advanced Practice Providers (APPs -  Physician Assistants and Nurse Practitioners) who all work together to provide you with the care you need, when you need it.  Your next appointment:   3 month(s)  The format for your next appointment:   In Person  Provider:   Werner Lean, MD

## 2021-11-15 NOTE — Progress Notes (Addendum)
Heart and Vascular Care Navigation  11/15/2021  Kylie Wright 1965-11-11 [id number]  Reason for Referral: Financial concerns with paying for medical bills   Engaged with patient by telephone for initial visit for Heart and Vascular Care Coordination.                                                                                                   Assessment:  CSW spoke with pt regarding above concerns.  Pt stopped working full time in the Spring (around March) and has been working part time since- currently working around 14 hours a week. After stopping her full time work she lost her insurance and has been struggling with medical bills ever since.    Applied for [employer] and [employer] to help with medical visits but was only approved for 75% coverage through [employer] and was denied for [employer] for having too much in resources.  She cannot continue to pay for 25% copays and is behind on medical bills.  Attempted to apply for Medicaid but was told by [facility] that she did not meet criteria for disability.  Has pending case with [employer] for disability benefits (applied in March) and is hopeful to hear something soon- has been told that they have all the medical records they need to make a decision.  If she is approved hopeful she can also get Medicaid.  Pt is currently getting medications through various assistance organizations.  Applied for and approved for [place] [facility] but a lot of her medications are not available that way- also gets some meds through [place] assistance program but still has to pay copays but she finds these manageable.  Also lives with her son who helps with other bills through his full time work.  They live in house that her sister is helping with and can manage other utility bills.  CSW discussed looking into insurance through [facility] to see if that would provide sufficient coverage to make her feel comfortable going to medical appts.  Also reiterated that she currently has 75% coverage  for [facility] bills and that if the MDs are suggesting life saving procedures or time sensitive testing that she should consider this to avoid her health declining.                                      HRT/VAS Care Coordination     Home Assistive Devices/Equipment None       Social History:                                                                             SDOH Screenings   Alcohol Screen: Not on file  Depression (PHQ2-9): Low Risk  (09/11/2020)   Depression (  PHQ2-9)    PHQ-2 Score: 0  Financial Resource Strain: High Risk (11/15/2021)   Overall Financial Resource Strain (CARDIA)    Difficulty of Paying Living Expenses: Hard  Food Insecurity: No Food Insecurity (11/15/2021)   Hunger Vital Sign    Worried About Running Out of Food in the Last Year: Never true    Ran Out of Food in the Last Year: Never true  Housing: Low Risk  (11/15/2021)   Housing    Last Housing Risk Score: 0  Physical Activity: Not on file  Social Connections: Not on file  Stress: Not on file  Tobacco Use: High Risk (11/15/2021)   Patient History    Smoking Tobacco Use: Every Day    Smokeless Tobacco Use: Never    Passive Exposure: Not on file  Transportation Needs: Not on file    SDOH Interventions: Financial Resources:  Financial Strain Interventions: Other (Comment)   Food Insecurity:  Food Insecurity Interventions: Intervention Not Indicated  Housing Insecurity:  Housing Interventions: Intervention Not Indicated  Transportation:    Not assessed   Follow-up plan:    CSW mailed pt information on [employer] insurance.  Pt will continue to work with [employer] for disability- can consider reapplying for Medicaid if approved for disability.  Jorge Ny, LCSW Clinical Social Worker [facility] Desk#: [phone] Cell#: [phone]

## 2021-11-15 NOTE — Telephone Encounter (Signed)
CSW consulted to speak with pt about financial concerns affecting her ability to get necessary medical care.  CSW attempted to call pt to discuss- left VM requesting return call  Jorge Ny, [facility] Desk#: [phone] Cell#: [phone]

## 2021-11-22 ENCOUNTER — Telehealth: Payer: Self-pay | Admitting: Internal Medicine

## 2021-11-22 DIAGNOSIS — I10 Essential (primary) hypertension: Secondary | ICD-10-CM

## 2021-11-22 NOTE — Addendum Note (Signed)
Addended by: Binnie Rail on: 11/22/2021 11:24 AM   Modules accepted: Orders

## 2021-11-22 NOTE — Telephone Encounter (Signed)
Please call her - cardio wants her to have her kidney function/potassium rechecked on 9/7 or 9/8 at our lab -- this was ordered.  Please call her to schedule.

## 2021-11-23 NOTE — Telephone Encounter (Signed)
Called and left message for patient.  If she returns call please get her set up for lab appointment.

## 2021-11-26 ENCOUNTER — Other Ambulatory Visit (INDEPENDENT_AMBULATORY_CARE_PROVIDER_SITE_OTHER): Payer: Self-pay

## 2021-11-26 DIAGNOSIS — I10 Essential (primary) hypertension: Secondary | ICD-10-CM

## 2021-11-26 LAB — BASIC METABOLIC PANEL
BUN: 19 mg/dL (ref 6–23)
CO2: 27 mEq/L (ref 19–32)
Calcium: 9.1 mg/dL (ref 8.4–10.5)
Chloride: 100 mEq/L (ref 96–112)
Creatinine, Ser: 1.38 mg/dL — ABNORMAL HIGH (ref 0.40–1.20)
GFR: 42.64 mL/min — ABNORMAL LOW (ref >60.00)
Glucose, Bld: 105 mg/dL — ABNORMAL HIGH (ref 70–99)
Potassium: 3.4 mEq/L — ABNORMAL LOW (ref 3.5–5.1)
Sodium: 135 mEq/L (ref 135–145)

## 2021-11-26 NOTE — Telephone Encounter (Signed)
Pt is headed to lab now.

## 2021-11-28 ENCOUNTER — Other Ambulatory Visit: Payer: Self-pay | Admitting: Internal Medicine

## 2021-11-28 DIAGNOSIS — I10 Essential (primary) hypertension: Secondary | ICD-10-CM

## 2021-11-29 ENCOUNTER — Telehealth: Payer: Self-pay

## 2021-11-29 DIAGNOSIS — I10 Essential (primary) hypertension: Secondary | ICD-10-CM

## 2021-11-29 MED ORDER — SPIRONOLACTONE 50 MG PO TABS: 50.0000 mg | ORAL_TABLET | Freq: Every day | ORAL | 3 refills | Status: DC

## 2021-11-29 MED ORDER — HYDROCHLOROTHIAZIDE 25 MG PO TABS: 25.0000 mg | ORAL_TABLET | Freq: Every day | ORAL | 3 refills | Status: DC

## 2021-11-29 NOTE — Telephone Encounter (Signed)
-----   Message from Werner Lean, MD sent at 11/28/2021 11:26 AM EDT ----- Results: K is low, Creatinie is up Plan: Decrease HCTZ to 25 mg PO daily and repeat labs in 1-2 weeks  Werner Lean, MD

## 2021-11-29 NOTE — Telephone Encounter (Signed)
The patient has been notified of the result and verbalized understanding.  All questions (if any) were answered. Precious Gilding, RN 11/29/2021 11:41 AM  MD made an additional verbal change: Increase spironolactone to 50 mg PO QD.  Pt wrote down instructions will come in for f/u labs on 12/10/21.

## 2021-12-13 ENCOUNTER — Ambulatory Visit: Payer: Medicaid Other | Attending: Internal Medicine

## 2021-12-13 DIAGNOSIS — I10 Essential (primary) hypertension: Secondary | ICD-10-CM | POA: Insufficient documentation

## 2021-12-13 LAB — BASIC METABOLIC PANEL
BUN/Creatinine Ratio: 12 (ref 9–23)
BUN: 15 mg/dL (ref 6–24)
CO2: 24 mmol/L (ref 20–29)
Calcium: 8.5 mg/dL — ABNORMAL LOW (ref 8.7–10.2)
Chloride: 105 mmol/L (ref 96–106)
Creatinine, Ser: 1.3 mg/dL — ABNORMAL HIGH (ref 0.57–1.00)
Glucose: 120 mg/dL — ABNORMAL HIGH (ref 70–99)
Potassium: 4 mmol/L (ref 3.5–5.2)
Sodium: 137 mmol/L (ref 134–144)
eGFR: 48 mL/min/{1.73_m2} — ABNORMAL LOW (ref >59)

## 2021-12-14 ENCOUNTER — Other Ambulatory Visit: Payer: Self-pay | Admitting: Internal Medicine

## 2021-12-14 DIAGNOSIS — I10 Essential (primary) hypertension: Secondary | ICD-10-CM

## 2022-01-15 ENCOUNTER — Encounter: Payer: Self-pay | Admitting: Internal Medicine

## 2022-02-18 ENCOUNTER — Encounter: Payer: Self-pay | Admitting: Internal Medicine

## 2022-02-18 ENCOUNTER — Ambulatory Visit: Payer: Medicaid Other | Attending: Internal Medicine | Admitting: Internal Medicine

## 2022-02-18 VITALS — BP 125/73 | HR 52 | Ht 62.0 in | Wt 195.0 lb

## 2022-02-18 DIAGNOSIS — I421 Obstructive hypertrophic cardiomyopathy: Secondary | ICD-10-CM | POA: Diagnosis not present

## 2022-02-18 NOTE — Patient Instructions (Signed)
Medication Instructions:  Try Dramamine over-the-counter, let us know how this works. It does come in a non-drowsy formula *If you need a refill on your cardiac medications before your next appointment, please call your pharmacy*   Lab Work: None  If you have labs (blood work) drawn today and your tests are completely normal, you will receive your results only by:  [place] (if you have MyChart) OR A paper copy in the mail If you have any lab test that is abnormal or we need to change your treatment, we will call you to review the results.  Testing/Procedures: None    Follow-Up: At [facility], you and your health needs are our priority.  As part of our continuing mission to provide you with exceptional heart care, we have created designated Provider Care Teams.  These Care Teams include your primary Cardiologist (physician) and Advanced Practice Providers (APPs -  Physician Assistants and Nurse Practitioners) who all work together to provide you with the care you need, when you need it.  Your next appointment:   As needed   The format for your next appointment:   In Person  Provider:   Werner Lean, MD     Other Instructions   Important Information About Sugar

## 2022-02-18 NOTE — Patient Instructions (Signed)
For dizziness, you can try dramamine which is over the counter.

## 2022-02-18 NOTE — Progress Notes (Signed)
Cardiology Office Note:    Date:  02/18/2022   ID:  LUV MISH, DOB 1965/07/26, MRN [id number]  PCP:  Binnie Rail, MD   [facility] Providers Cardiologist:  Werner Lean, MD     Referring MD: Binnie Rail, MD   No chief complaint on file. HOCM  History of Present Illness:   Initial presentation  Kylie Wright is a 56 y.o. female with a hx of bipolar, PTSD, drug abuse, ADHD, cataracts, referral for HOCM  She saw her PCP who heard a murmur on exam 09/11/2020 and echo showed asymmetric basal septal hypertrophy, outflow tract obstruction, late peaking CW doppler. Peak gradient at rest noted to be 39 mmHg, SAM with possible subaortic membrane. She denies chest pain, sob. She has  LH  and dizziness. No palpitations. In 2016, she was presyncopal and she fell and lost consciousness.Marland Kitchen She was at an 8 am AA meeting, she was talking to someone in the parking lot. She felt like she was going to faint. She had another episode and she felt faint before a blood draw.She did not syncopize. She went to the ER and diagnosed with vertigo. She notes some chest pain and feels it is linked to anxiety with overwhelmed. It stays in the center of her chest and stays a long time.  She says happens 20-45 minutes, happens every 3-4 months.  She is an active smoking. Notes some sob with going uphill. 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 This can happen with driving. Her mother had heart dx. Father no heart diease. She has two sisters. They have hypertension She has no family members with SCD.   Interim Hx: An MRI was ordered, and this was cost prohibitive. She had an ETT that appropriate rate response in the setting of sinus bradycardia. She was evaluated by EP as well. Plan was to stop  verapamil. She saw Dr. Gasper Sells for consideration of mevacamtem.  This may be cost prohibitive. She is planning on working part time and/or disability. She is  applying to determine if she meets criteria.   Today she notes that she can feels presyncopal at times, but no syncope. She can get SOB with activity , this is chronic. It has not progressed.  Otherwise she is asymptomatic.  Interim Hx 12/1  She got on disability and got insurance. She's on a facebook gorup for HOCM. Otherwise she was seen Dr. Gasper Sells. She can still get LH and dizzy just with sitting. No syncope.   Cardiology Studies  TTE 07/15/2021 EF 65-70% Severe asymmetric basal septal LVH, Peak LV outflow tract gradient was 35 mmHg with Valsalva. Left ventricular diastolic parameters. PW thickness 11 mm RV fxn is normal Mild MR No pulmonary HTN   EXERCISE TOLERANCE TEST (ETT) 02/26/2021   Narrative   Patient exercised according to the CVN-BRUCE for 3:77mn achieving 4.9 METs consistent with poor exercise capacity   Target HR was not achieved. Max HR was 89 which represents 54% of MAPHR.   No ST deviation was noted, however, study is non-diagnostic due to inability to achieve target HR   Frequent PACs but no PVCs/VT during study   Overall, poor exercise capacity with nondiagnostic study due to inability to achieve target HR. Recommend either coronary CTA vs myoview if clinically indicated.   TTE 10/16/2020 1. Left ventricular ejection fraction, by estimation, is 60 to 65%. The  left ventricle has normal function. The left  ventricle has no regional  wall motion abnormalities. There is moderate left ventricular hypertrophy.  Left ventricular diastolic  parameters are consistent with Grade I diastolic dysfunction (impaired  relaxation).   2. Right ventricular systolic function is normal. The right ventricular  size is normal.   3. Left atrial size was moderately dilated.   4. The mitral valve is abnormal. Trivial mitral valve regurgitation. No  evidence of mitral stenosis.   5. Appears to be SAM with bifid CW across AV suggesting HOCM with peak  gradient at rest 39 mmHg  Consider f/u TTE to confirm sub valvular gradient  and mechanism . The aortic valve was not well visualized. There is mild  calcification of the aortic valve.  Aortic valve regurgitation is not visualized. Mild aortic valve sclerosis  is present, with no evidence of aortic valve stenosis.   6. The inferior vena cava is normal in size with greater than 50%  respiratory variability, suggesting right atrial pressure of 3 mmHg.       Past Medical History:  Diagnosis Date   ADHD (attention deficit hyperactivity disorder)    Bipolar 1 disorder ([place])    History of alcohol abuse    History of drug abuse ([place])    Interstitial cystitis     Past Surgical History:  Procedure Laterality Date   ADENOIDECTOMY  1971   APPENDECTOMY     SALPINGECTOMY  1993   vaginal surgery      Current Medications: No outpatient medications have been marked as taking for the 02/18/22 encounter (Appointment) with Janina Mayo, MD.     Allergies:   Patient has no known allergies.   Social History   Socioeconomic History   Marital status: Single    Spouse name: Not on file   Number of children: 1   Years of education: Not on file   Highest education level: Not on file  Occupational History    Employer: [employer]  Tobacco Use   Smoking status: Every Day    Packs/day: 0.50    Types: Cigarettes   Smokeless tobacco: Never  Substance and Sexual Activity   Alcohol use: No    Alcohol/week: 0.0 standard drinks of alcohol   Drug use: No    Comment: h/o abuse hasn't used since 2002   Sexual activity: Not Currently    Partners: Male    Birth control/protection: None  Other Topics Concern   Not on file  Social History Narrative   Exercise:  Walks a lot, tries to [place]    Social Determinants of Health   Financial Resource Strain: High Risk (11/15/2021)   Overall Financial Resource Strain (CARDIA)    Difficulty of Paying Living Expenses: Hard  Food Insecurity: No Food Insecurity  (11/15/2021)   Hunger Vital Sign    Worried About Running Out of Food in the Last Year: Never true    Ran Out of Food in the Last Year: Never true  Transportation Needs: Not on file  Physical Activity: Not on file  Stress: Not on file  Social Connections: Not on file     Family History: The patient's family history includes Alcohol abuse in her father; Alcoholism in an other family member; Arthritis in her mother; Cancer in an other family member; Cancer (age of onset: 19) in her sister; Diabetes in her mother and another family member; Heart disease in her mother; Hyperlipidemia in her mother; Hypertension in her mother and another family member; Mental illness in her mother and  another family member; Stroke in her mother.  ROS:   Please see the history of present illness.     All other systems reviewed and are negative.  EKGs/Labs/Other Studies Reviewed:    The following studies were reviewed today:   EKG:  EKG is  ordered today.  The ekg ordered today demonstrates   Sinus bradycardia , LAE  Recent Labs: 03/26/2021: TSH 4.08 10/08/2021: ALT 12; Hemoglobin 13.2; Platelets 169.0 12/13/2021: BUN 15; Creatinine, Ser 1.30; Potassium 4.0; Sodium 137  Recent Lipid Panel    Component Value Date/Time   CHOL 153 10/08/2021 1114   TRIG 185.0 (H) 10/08/2021 1114   HDL 37.00 (L) 10/08/2021 1114   CHOLHDL 4 10/08/2021 1114   VLDL 37.0 10/08/2021 1114   LDLCALC 79 10/08/2021 1114     Risk Assessment/Calculations:           Physical Exam:    VS:   Vitals:   02/18/22 0847  BP: 125/73  Pulse: (!) 52  SpO2: 97%     Wt Readings from Last 3 Encounters:  11/15/21 195 lb (88.5 kg)  10/08/21 193 lb 6.4 oz (87.7 kg)  08/23/21 195 lb 12.8 oz (88.8 kg)     GEN:  Well nourished, well developed in no acute distress HEENT: Normal NECK: No JVD; No carotid bruits LYMPHATICS: No lymphadenopathy CARDIAC: RRR, + SEM at the midsternum louder with valsalva, rubs, gallops RESPIRATORY:   Clear to auscultation without rales, wheezing or rhonchi  ABDOMEN: Soft, non-tender, non-distended MUSCULOSKELETAL:  No edema; No deformity  SKIN: Warm and dry NEUROLOGIC:  Alert and oriented x 3 PSYCHIATRIC: flat affect  ASSESSMENT:    #HOCM: Diagnosed 08/2020. Had lower Hrs and sent to Dr. Caryl Comes for bradycardia after starting verapamil. This was stopped. Peak gradient 39 mmHg. No high risk features. She has no family hx of SCD. She has no evidence of NSVT/VT. No family hx of SCD. No episodes of sudden cardiac arrest. Did plan for cardiac MRI, however this was cost prohibitive. She was referred for genetic testing, not done. Saw Dr. Gasper Sells who is following her. She did not tolerate mavacamten 2/2 hair loss. She cannot tolerate dispyramide. Continued on  nebivolol. Her rhythm is normal. If her symptoms progress, considering septal myectomy/ablation likely with [facility] referral.  Her symptoms of dizzy spells are also related to vertigo. Recommended dramamine OTC. No syncope. Low suspicion for arrhythmia. Her gradient is significant which contributes.  PLAN:    In order of problems listed above:   Can continue to follow with HOCM clinic, will see her if there are any changes      Medication Adjustments/Labs and Tests Ordered: Current medicines are reviewed at length with the patient today.  Concerns regarding medicines are outlined above.   Signed, Janina Mayo, MD  02/18/2022 7:57 AM    [facility]

## 2022-02-27 ENCOUNTER — Encounter: Payer: Self-pay | Admitting: Internal Medicine

## 2022-03-02 NOTE — Progress Notes (Signed)
Cardiology Office Note:    Date:  03/03/2022   ID:  Kylie Wright, DOB 07-03-1965, MRN [id number]  PCP:  Binnie Rail, MD   [facility] Providers Cardiologist:  Werner Lean, MD     Referring MD: Binnie Rail, MD   CC: HCM follow up  History of Present Illness:    Kylie Wright is a 56 y.o. female with a hx of HOCM seen by Dr. Harl Bowie and Caryl Comes who presents for initiation of mavacamten.  Seen in follow up after changes led to [facility] discontinuation. 2023:  Post mavacamten start, patient had improved symptoms.    NYHA I on therapy. She noticed handfuls of hair falling out on mavacamten; this stopped after stopping the medication.  We stopped the medication for this reason but also because she lost her insurance.  She is pending [place] assist and disability.  We started disopyramide with improved symptoms.  She was able to get insurance and disability and is seen today for discussions of therapy.  Patient has good days and bad days. Has less chest pain on Norpace. Has stable SOB and DOE. Same palpitations Still have dizziness. Still has fatigue. No syncope.  Constant dry mouth, significant constipation.  And urinary retention. Has dry eyes.   Her sister got a PPM at [facility] for PPM.     Past Medical History:  Diagnosis Date   ADHD (attention deficit hyperactivity disorder)    Bipolar 1 disorder ([place])    History of alcohol abuse    History of drug abuse ([place])    Interstitial cystitis     Past Surgical History:  Procedure Laterality Date   ADENOIDECTOMY  1971   APPENDECTOMY     SALPINGECTOMY  1993   vaginal surgery      Current Medications: Current Meds  Medication Sig   amphetamine-dextroamphetamine (ADDERALL XR) 20 MG 24 hr capsule Take 20 mg by mouth every morning.   disopyramide (NORPACE) 100 MG capsule Take 1 capsule (100 mg total) by mouth 4 (four) times daily.   docusate sodium (COLACE) 100 MG capsule Take 100 mg by mouth  daily. Can take up to 3 tabs per day   hydrochlorothiazide (HYDRODIURIL) 25 MG tablet Take 1 tablet (25 mg total) by mouth daily.   latanoprost (XALATAN) 0.005 % ophthalmic solution Place 0.024 drops into both eyes at bedtime.   loratadine (CLARITIN) 10 MG tablet Take 10 mg by mouth daily as needed for allergies.   meclizine (ANTIVERT) 25 MG tablet Take 25 mg by mouth as needed for dizziness. Can take up to 3x/day   Melatonin 10 MG TABS Take by mouth.   meloxicam (MOBIC) 7.5 MG tablet TAKE 1 TABLET BY MOUTH TWICE  DAILY AS NEEDED FOR PAIN   nebivolol (BYSTOLIC) 10 MG tablet Take 1 tablet (10 mg total) by mouth daily.   OLANZapine-Samidorphan 5-10 MG TABS Take by mouth.   Olopatadine HCl (PAZEO) 0.7 % SOLN PLACE 1 DROP INTO BOTH EYES EVERY MORNING   Potassium 99 MG TABS Take by mouth.   rosuvastatin (CRESTOR) 10 MG tablet Take 1 tablet (10 mg total) by mouth daily.   spironolactone (ALDACTONE) 50 MG tablet Take 1 tablet (50 mg total) by mouth daily.   timolol (TIMOPTIC) 0.5 % ophthalmic solution SMARTSIG:In Eye(s)   VESICARE 10 MG tablet Take 10 mg by mouth daily.     Allergies:   Patient has no known allergies.   Social History   Socioeconomic History  Marital status: Single    Spouse name: Not on file   Number of children: 1   Years of education: Not on file   Highest education level: Not on file  Occupational History    Employer: [employer]  Tobacco Use   Smoking status: Every Day    Packs/day: 0.50    Types: Cigarettes   Smokeless tobacco: Never  Substance and Sexual Activity   Alcohol use: No    Alcohol/week: 0.0 standard drinks of alcohol   Drug use: No    Comment: h/o abuse hasn't used since 2002   Sexual activity: Not Currently    Partners: Male    Birth control/protection: None  Other Topics Concern   Not on file  Social History Narrative   Exercise:  Walks a lot, tries to [place]    Social Determinants of Health   Financial Resource Strain: High  Risk (11/15/2021)   Overall Financial Resource Strain (CARDIA)    Difficulty of Paying Living Expenses: Hard  Food Insecurity: No Food Insecurity (11/15/2021)   Hunger Vital Sign    Worried About Running Out of Food in the Last Year: Never true    Ran Out of Food in the Last Year: Never true  Transportation Needs: Not on file  Physical Activity: Not on file  Stress: Not on file  Social Connections: Not on file    Social: Formerly very active- worked at [employer], worked in Librarian, academic, 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  ROS:   Please see the history of present illness.     All other systems reviewed and are negative.  EKGs/Labs/Other Studies Reviewed:    The following studies were reviewed today: 11/15/21: Sinus bradycardia one PAC  Cardiac Studies & Procedures     STRESS TESTS  ECHOCARDIOGRAM STRESS TEST 12/10/2021  Narrative EXERCISE STRESS ECHO REPORT   --------------------------------------------------------------------------------  [name]  Date of Exam: 05/27/2021 Medical Rec #:  [id number]     Height:       62.0 in Accession #:    [id number]    Weight:       193.9 lb Date of Birth:  06-Nov-1965      BSA:          1.887 m Patient Age:    74 years      BP:           128/82 mmHg Patient Gender: F             HR:           54 bpm. Exam Location:  [address]  Procedure: Stress Echo, Color Doppler and Cardiac Doppler  Indications:    I42.2 HOCM  History:        Patient has prior history of Echocardiogram examinations,  most recent 10/16/2020.  Sonographer:    Diamond Nickel RCS Referring Phys: [id number] [facility] A Shriyans Kuenzi  IMPRESSIONS   1. This is an inconclusive stress echocardiogram for ischemia. Target heart rate not achieved and stress images of LVF not acquired. 2. The findings of  the study demonstrate that a stress-induced outflow tract obstruction is present (peak LVOT gradient 63.2 mmHG).  FINDINGS  Exam Protocol:   Patient Performance: The patient exercised for 3 minutes and 15 seconds, achieving 5 METS. The baseline heart rate was 54 bpm. The heart rate at peak stress was 86 bpm. The target heart rate was calculated to be 139 bpm. The percentage of maximum predicted heart rate achieved was 52.6 %. The baseline blood pressure was 128/82 mmHg. The blood pressure at peak stress was 151/80 mmHg. The blood pressure response was normal. The patient developed shortness of breath and fatigue during the stress exam. The patient's functional capacity was below average.  EKG: Resting EKG showed sinus bradycardia with no abnormal findings. The patient developed no abnormal EKG findings during exercise. There was 0.5 mm of horizontal ST segment depression in lead(s) II, III and AVF that resolved in less than 1 minute(s) into recovery.   2D Echo Findings: The baseline ejection fraction was 70%. Baseline regional wall motion abnormalities were not present. This is an inconclusive stress echocardiogram for ischemia.  Stress Doppler:  LVOT: The baseline LVOT gradient was 44 mmHG. The peak LVOT gradient at stress was 63.2 mmHG. The findings of the study demonstrate that a stress-induced outflow tract obstruction is present.  Mitral Regurgitation: At baseline, mild mitral regurgitation was present.   Fransico Him MD Electronically signed on 05/28/2021 at 2:40:18 PM     Final   ECHOCARDIOGRAM  ECHOCARDIOGRAM COMPLETE 07/15/2021  Narrative ECHOCARDIOGRAM REPORT    [name] Date of Exam: 07/15/2021 Medical Rec #:  [id number]      Height:       62.0 in Accession #:    [id number]     Weight:       193.9 lb Date of Birth:  Apr 06, 1965       BSA:          1.887 m Patient Age:    66 years       BP:           128/80 mmHg Patient Gender: F              HR:           50 bpm. Exam Location:  [place]  Procedure: 2D Echo, 3D Echo, Cardiac Doppler, Color Doppler and Strain Analysis  Indications:    I42.1 HOCM.  History:        Patient has prior history of Echocardiogram examinations, most recent 05/27/2021. Hypertrophic Cardiomyopathy, Arrythmias:Bradycardia, Signs/Symptoms:Syncope and Murmur; Risk Factors:Hypertension, Dyslipidemia and Current Smoker. H/o EtOH. H/o drug abuse. Pre-diabetes.  Sonographer:    Basilia Jumbo [facility], RDCS Referring Phys: [id number] [facility] A Gasper Sells   Sonographer Comments: Camzyo start, day 1 06/19/2021. Patient did not have meal before exam. IMPRESSIONS   1. Left ventricular ejection fraction, by estimation, is 65 to 70%. The left ventricle has hyperdynamic function. The left ventricle has no regional wall motion abnormalities. Severe asymmetric basal septal left ventricular hypertrophy. There is mild systolic anterior motion of the mitral valve. Peak LV outflow tract gradient was 35 mmHg with Valsalva. Left ventricular diastolic parameters are consistent with Grade I diastolic dysfunction (impaired relaxation). The average left ventricular global longitudinal strain is -20.5 %. The global longitudinal strain is normal. 2. Right ventricular systolic function is normal. The right ventricular size is normal. Tricuspid regurgitation signal is inadequate for assessing PA pressure. 3. Left atrial size was mildly dilated. 4. The mitral  valve is abnormal with mild systolic anterior motion. Mild mitral valve regurgitation. No evidence of mitral stenosis. 5. The aortic valve is tricuspid. There is mild calcification of the aortic valve. Aortic  valve regurgitation is not visualized. No aortic stenosis is present. 6. The inferior vena cava is normal in size with greater than 50% respiratory variability, suggesting right atrial pressure of 3 mmHg.  Comparison(s): Stress echo 05/27/21 EF 70%. LVOT peak gradiant 78mHg.  FINDINGS Left Ventricle: Left ventricular ejection fraction, by estimation, is 65 to 70%. The left ventricle has hyperdynamic function. The left ventricle has no regional wall motion abnormalities. The average left ventricular global longitudinal strain is -20.5 %. The global longitudinal strain is normal. The left ventricular internal cavity size was normal in size. Severe asymmetric basal septal left ventricular hypertrophy. Left ventricular diastolic parameters are consistent with Grade I diastolic dysfunction (impaired relaxation).  Right Ventricle: The right ventricular size is normal. No increase in right ventricular wall thickness. Right ventricular systolic function is normal. Tricuspid regurgitation signal is inadequate for assessing PA pressure.  Left Atrium: Left atrial size was mildly dilated.  Right Atrium: Right atrial size was normal in size.  Pericardium: There is no evidence of pericardial effusion.  Mitral Valve: The mitral valve is abnormal. Mild mitral annular calcification. Mild mitral valve regurgitation. No evidence of mitral valve stenosis.  Tricuspid Valve: The tricuspid valve is normal in structure. Tricuspid valve regurgitation is not demonstrated.  Aortic Valve: The aortic valve is tricuspid. There is mild calcification of the aortic valve. Aortic valve regurgitation is not visualized. No aortic stenosis is present.  Pulmonic Valve: The pulmonic valve was normal in structure. Pulmonic valve regurgitation is not visualized.  Aorta: The aortic root is normal in size and structure.  Venous: The inferior vena cava is normal in size with greater than 50% respiratory variability, suggesting right  atrial pressure of 3 mmHg.  IAS/Shunts: No atrial level shunt detected by color flow Doppler.   LEFT VENTRICLE PLAX 2D LVIDd:         4.10 cm   Diastology LVIDs:         2.70 cm   LV e' medial:    6.73 cm/s LV PW:         1.10 cm   LV E/e' medial:  14.0 LV IVS:        1.10 cm   LV e' lateral:   6.89 cm/s LVOT diam:     2.00 cm   LV E/e' lateral: 13.7 LV SV:         118 LV SV Index:   63        2D Longitudinal Strain LVOT Area:     3.14 cm  2D Strain GLS (A2C):   -20.5 % 2D Strain GLS (A3C):   -18.4 % 2D Strain GLS (A4C):   -22.5 % 2D Strain GLS Avg:     -20.5 %  3D Volume EF: 3D EF:        58 % LV EDV:       89 ml LV ESV:       37 ml LV SV:        52 ml  RIGHT VENTRICLE             IVC RV Basal diam:  2.40 cm     IVC diam: 1.50 cm RV S prime:     12.10 cm/s TAPSE (M-mode): 1.6 cm  LEFT ATRIUM  Index        RIGHT ATRIUM           Index LA diam:        4.50 cm 2.39 cm/m   RA Pressure: 3.00 mmHg LA Vol (A2C):   26.5 ml 14.05 ml/m  RA Area:     10.30 cm LA Vol (A4C):   43.3 ml 22.95 ml/m  RA Volume:   16.90 ml  8.96 ml/m LA Biplane Vol: 35.3 ml 18.71 ml/m AORTIC VALVE LVOT Vmax:   139.00 cm/s LVOT Vmean:  94.600 cm/s LVOT VTI:    0.377 m  AORTA Ao Root diam: 3.00 cm Ao Asc diam:  3.40 cm  MITRAL VALVE                TRICUSPID VALVE Estimated RAP:  3.00 mmHg MV Decel Time: 348 msec MV E velocity: 94.30 cm/s   SHUNTS MV A velocity: 120.00 cm/s  Systemic VTI:  0.38 m MV E/A ratio:  0.79         Systemic Diam: 2.00 cm  Dalton McleanMD Electronically signed by Franki Monte Signature Date/Time: 07/15/2021/4:23:56 PM    Final    MONITORS  LONG TERM MONITOR (3-14 DAYS) 02/09/2021  Narrative  Abnormal study  Agree with findings Predominant sinus rhythm. Episodes of a junctional rhythm. Symptomatic bradycardia with heart rates in the 40s in the afternoon. Ectopic atrial rhythm.  4 beats of NSVT. No sustained VT.   Patch Wear Time:  7 days  and 1 hours (2022-11-08T18:38:17-0500 to 2022-11-15T19:58:02-499)  Patient had a min HR of 37 bpm, max HR of 162 bpm, and avg HR of 51 bpm. Predominant underlying rhythm was Sinus Rhythm. Slight P wave morphology changes were noted. 1 run of Ventricular Tachycardia occurred lasting 4 beats with a max rate of 162 bpm (avg 155 bpm). Ectopic Atrial Rhythm was present. Junctional Rhythm was present. Ventricular Tachycardia, Ectopic Atrial Rhythm and Junctional Rhythm were detected within +/- 45 seconds of symptomatic patient event(s). Isolated SVEs were occasional (1.1%, 5867), SVE Couplets were rare (<1.0%, 148), and SVE Triplets were rare (<1.0%, 2). Isolated VEs were rare (<1.0%), and no VE Couplets or VE Triplets were present.             Recent Labs: 03/26/2021: TSH 4.08 10/08/2021: ALT 12; Hemoglobin 13.2; Platelets 169.0 12/13/2021: BUN 15; Creatinine, Ser 1.30; Potassium 4.0; Sodium 137  Recent Lipid Panel    Component Value Date/Time   CHOL 153 10/08/2021 1114   TRIG 185.0 (H) 10/08/2021 1114   HDL 37.00 (L) 10/08/2021 1114   CHOLHDL 4 10/08/2021 1114   VLDL 37.0 10/08/2021 1114   LDLCALC 79 10/08/2021 1114        Physical Exam:    VS:  BP (!) 140/70   Pulse (!) 51   Ht '5\' 2"'$  (1.575 m)   Wt 194 lb (88 kg)   SpO2 98%   BMI 35.48 kg/m     Wt Readings from Last 3 Encounters:  03/03/22 194 lb (88 kg)  02/18/22 195 lb (88.5 kg)  11/15/21 195 lb (88.5 kg)    Gen: No distress    Neck: No JVD Cardiac: No Rubs or Gallops, harsh systolic murmur, RRR +2 radial pulses Respiratory: Clear to auscultation bilaterally, normal effort, normal  respiratory rate GI: Soft, nontender, non-distended  MS: No  edema;  moves all extremities Integument: Skin feels well Neuro:  At time of evaluation, alert and oriented to person/place/time/situation  Psych: Normal affect, patient feels  in good spirits  ASSESSMENT:    No diagnosis found.  PLAN:    Hypertrophic Cardiomyopathy - NYHA  III - severe LVOT gradient (63 on stress echo) - would transition from nebvivolol (not optimatil for HCM and not covered by insurance  - she cannot tolerate higher doses of dispyramide 100 mg PO q6hr because of vagolytic symptoms (dry mouth and urinate retention); she feels poorly now - had hair loss with mavacamten - will need Septal reduction therapy or non-CMI clinic trial (potentially for ninerafaxstat vs trial of aficamten) - she will need CMR (scar burden of septum), LHC/RHC  and TEE (me) - After testing will be referring her to [facility] for [facility] of intervention - she has found a lot of benefit with the [facility] information we sent and joined an Facebook support group   HTN Vertigo - Goal to come off afterload reducing agents - she is followed by Dr. Beckie Busing and improved on dramamine as her Dr. Harl Bowie   Time Spent Directly with Patient:   I have spent a total of 40 minutes with the patient reviewing notes, imaging, EKGs, labs and examining the patient as well as establishing an assessment and plan that was discussed personally with the patient.  > 50% of time was spent in direct patient care.             Medication Adjustments/Labs and Tests Ordered: Current medicines are reviewed at length with the patient today.  Concerns regarding medicines are outlined above.  No orders of the defined types were placed in this encounter.  No orders of the defined types were placed in this encounter.   There are no Patient Instructions on file for this visit.   Signed, Werner Lean, MD  03/03/2022 12:23 PM    [place]

## 2022-03-02 NOTE — H&P (View-Only) (Signed)
Cardiology Office Note:    Date:  03/03/2022   ID:  Kylie Wright, DOB 1965/07/23, MRN [id number]  PCP:  Kylie Rail, MD   [facility] Providers Cardiologist:  Kylie Lean, MD     Referring MD: Kylie Rail, MD   CC: HCM follow up  History of Present Illness:    Kylie Wright is a 56 y.o. female with a hx of HOCM seen by Dr. Harl Wright and Kylie Wright who presents for initiation of mavacamten.  Seen in follow up after changes led to [facility] discontinuation. 2023:  Post mavacamten start, patient had improved symptoms.    NYHA I on therapy. She noticed handfuls of hair falling out on mavacamten; this stopped after stopping the medication.  We stopped the medication for this reason but also because she lost her insurance.  She is pending [place] assist and disability.  We started disopyramide with improved symptoms.  She was able to get insurance and disability and is seen today for discussions of therapy.  Patient has good days and bad days. Has less chest pain on Norpace. Has stable SOB and DOE. Same palpitations Still have dizziness. Still has fatigue. No syncope.  Constant dry mouth, significant constipation.  And urinary retention. Has dry eyes.   Her sister got a PPM at [facility] for PPM.     Past Medical History:  Diagnosis Date   ADHD (attention deficit hyperactivity disorder)    Bipolar 1 disorder ([place])    History of alcohol abuse    History of drug abuse ([place])    Interstitial cystitis     Past Surgical History:  Procedure Laterality Date   ADENOIDECTOMY  1971   APPENDECTOMY     SALPINGECTOMY  1993   vaginal surgery      Current Medications: Current Meds  Medication Sig   amphetamine-dextroamphetamine (ADDERALL XR) 20 MG 24 hr capsule Take 20 mg by mouth every morning.   disopyramide (NORPACE) 100 MG capsule Take 1 capsule (100 mg total) by mouth 4 (four) times daily.   docusate sodium (COLACE) 100 MG capsule Take 100 mg by mouth  daily. Can take up to 3 tabs per day   hydrochlorothiazide (HYDRODIURIL) 25 MG tablet Take 1 tablet (25 mg total) by mouth daily.   latanoprost (XALATAN) 0.005 % ophthalmic solution Place 0.024 drops into both eyes at bedtime.   loratadine (CLARITIN) 10 MG tablet Take 10 mg by mouth daily as needed for allergies.   meclizine (ANTIVERT) 25 MG tablet Take 25 mg by mouth as needed for dizziness. Can take up to 3x/day   Melatonin 10 MG TABS Take by mouth.   meloxicam (MOBIC) 7.5 MG tablet TAKE 1 TABLET BY MOUTH TWICE  DAILY AS NEEDED FOR PAIN   nebivolol (BYSTOLIC) 10 MG tablet Take 1 tablet (10 mg total) by mouth daily.   OLANZapine-Samidorphan 5-10 MG TABS Take by mouth.   Olopatadine HCl (PAZEO) 0.7 % SOLN PLACE 1 DROP INTO BOTH EYES EVERY MORNING   Potassium 99 MG TABS Take by mouth.   rosuvastatin (CRESTOR) 10 MG tablet Take 1 tablet (10 mg total) by mouth daily.   spironolactone (ALDACTONE) 50 MG tablet Take 1 tablet (50 mg total) by mouth daily.   timolol (TIMOPTIC) 0.5 % ophthalmic solution SMARTSIG:In Eye(s)   VESICARE 10 MG tablet Take 10 mg by mouth daily.     Allergies:   Patient has no known allergies.   Social History   Socioeconomic History  Marital status: Single    Spouse name: Not on file   Number of children: 1   Years of education: Not on file   Highest education level: Not on file  Occupational History    Employer: [employer]  Tobacco Use   Smoking status: Every Day    Packs/day: 0.50    Types: Cigarettes   Smokeless tobacco: Never  Substance and Sexual Activity   Alcohol use: No    Alcohol/week: 0.0 standard drinks of alcohol   Drug use: No    Comment: h/o abuse hasn't used since 2002   Sexual activity: Not Currently    Partners: Male    Birth control/protection: None  Other Topics Concern   Not on file  Social History Narrative   Exercise:  Walks a lot, tries to [place]    Social Determinants of Health   Financial Resource Strain: High  Risk (11/15/2021)   Overall Financial Resource Strain (CARDIA)    Difficulty of Paying Living Expenses: Hard  Food Insecurity: No Food Insecurity (11/15/2021)   Hunger Vital Sign    Worried About Running Out of Food in the Last Year: Never true    Ran Out of Food in the Last Year: Never true  Transportation Needs: Not on file  Physical Activity: Not on file  Stress: Not on file  Social Connections: Not on file    Social: Formerly very active- worked at [employer], worked in Librarian, academic, 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  ROS:   Please see the history of present illness.     All other systems reviewed and are negative.  EKGs/Labs/Other Studies Reviewed:    The following studies were reviewed today: 11/15/21: Sinus bradycardia one PAC  Cardiac Studies & Procedures     STRESS TESTS  ECHOCARDIOGRAM STRESS TEST 12/10/2021  Narrative EXERCISE STRESS ECHO REPORT   --------------------------------------------------------------------------------  [name]  Date of Exam: 05/27/2021 Medical Rec #:  [id number]     Height:       62.0 in Accession #:    [id number]    Weight:       193.9 lb Date of Birth:  06-01-1965      BSA:          1.887 m Patient Age:    52 years      BP:           128/82 mmHg Patient Gender: F             HR:           54 bpm. Exam Location:  [address]  Procedure: Stress Echo, Color Doppler and Cardiac Doppler  Indications:    I42.2 HOCM  History:        Patient has prior history of Echocardiogram examinations,  most recent 10/16/2020.  Sonographer:    Diamond Nickel RCS Referring Phys: [id number] [facility] A Truong Delcastillo  IMPRESSIONS   1. This is an inconclusive stress echocardiogram for ischemia. Target heart rate not achieved and stress images of LVF not acquired. 2. The findings of  the study demonstrate that a stress-induced outflow tract obstruction is present (peak LVOT gradient 63.2 mmHG).  FINDINGS  Exam Protocol:   Patient Performance: The patient exercised for 3 minutes and 15 seconds, achieving 5 METS. The baseline heart rate was 54 bpm. The heart rate at peak stress was 86 bpm. The target heart rate was calculated to be 139 bpm. The percentage of maximum predicted heart rate achieved was 52.6 %. The baseline blood pressure was 128/82 mmHg. The blood pressure at peak stress was 151/80 mmHg. The blood pressure response was normal. The patient developed shortness of breath and fatigue during the stress exam. The patient's functional capacity was below average.  EKG: Resting EKG showed sinus bradycardia with no abnormal findings. The patient developed no abnormal EKG findings during exercise. There was 0.5 mm of horizontal ST segment depression in lead(s) II, III and AVF that resolved in less than 1 minute(s) into recovery.   2D Echo Findings: The baseline ejection fraction was 70%. Baseline regional wall motion abnormalities were not present. This is an inconclusive stress echocardiogram for ischemia.  Stress Doppler:  LVOT: The baseline LVOT gradient was 44 mmHG. The peak LVOT gradient at stress was 63.2 mmHG. The findings of the study demonstrate that a stress-induced outflow tract obstruction is present.  Mitral Regurgitation: At baseline, mild mitral regurgitation was present.   Fransico Him MD Electronically signed on 05/28/2021 at 2:40:18 PM     Final   ECHOCARDIOGRAM  ECHOCARDIOGRAM COMPLETE 07/15/2021  Narrative ECHOCARDIOGRAM REPORT    Patient Name:   MAIREN WALLENSTEIN Date of Exam: 07/15/2021 Medical Rec #:  [id number]      Height:       62.0 in Accession #:    [id number]     Weight:       193.9 lb Date of Birth:  Apr 30, 1965       BSA:          1.887 m Patient Age:    70 years       BP:           128/80 mmHg Patient Gender: F              HR:           50 bpm. Exam Location:  [place]  Procedure: 2D Echo, 3D Echo, Cardiac Doppler, Color Doppler and Strain Analysis  Indications:    I42.1 HOCM.  History:        Patient has prior history of Echocardiogram examinations, most recent 05/27/2021. Hypertrophic Cardiomyopathy, Arrythmias:Bradycardia, Signs/Symptoms:Syncope and Murmur; Risk Factors:Hypertension, Dyslipidemia and Current Smoker. H/o EtOH. H/o drug abuse. Pre-diabetes.  Sonographer:    Basilia Jumbo [facility], RDCS Referring Phys: [id number] [facility] A Gasper Sells   Sonographer Comments: Camzyo start, day 1 06/19/2021. Patient did not have meal before exam. IMPRESSIONS   1. Left ventricular ejection fraction, by estimation, is 65 to 70%. The left ventricle has hyperdynamic function. The left ventricle has no regional wall motion abnormalities. Severe asymmetric basal septal left ventricular hypertrophy. There is mild systolic anterior motion of the mitral valve. Peak LV outflow tract gradient was 35 mmHg with Valsalva. Left ventricular diastolic parameters are consistent with Grade I diastolic dysfunction (impaired relaxation). The average left ventricular global longitudinal strain is -20.5 %. The global longitudinal strain is normal. 2. Right ventricular systolic function is normal. The right ventricular size is normal. Tricuspid regurgitation signal is inadequate for assessing PA pressure. 3. Left atrial size was mildly dilated. 4. The mitral  valve is abnormal with mild systolic anterior motion. Mild mitral valve regurgitation. No evidence of mitral stenosis. 5. The aortic valve is tricuspid. There is mild calcification of the aortic valve. Aortic  valve regurgitation is not visualized. No aortic stenosis is present. 6. The inferior vena cava is normal in size with greater than 50% respiratory variability, suggesting right atrial pressure of 3 mmHg.  Comparison(s): Stress echo 05/27/21 EF 70%. LVOT peak gradiant 82mHg.  FINDINGS Left Ventricle: Left ventricular ejection fraction, by estimation, is 65 to 70%. The left ventricle has hyperdynamic function. The left ventricle has no regional wall motion abnormalities. The average left ventricular global longitudinal strain is -20.5 %. The global longitudinal strain is normal. The left ventricular internal cavity size was normal in size. Severe asymmetric basal septal left ventricular hypertrophy. Left ventricular diastolic parameters are consistent with Grade I diastolic dysfunction (impaired relaxation).  Right Ventricle: The right ventricular size is normal. No increase in right ventricular wall thickness. Right ventricular systolic function is normal. Tricuspid regurgitation signal is inadequate for assessing PA pressure.  Left Atrium: Left atrial size was mildly dilated.  Right Atrium: Right atrial size was normal in size.  Pericardium: There is no evidence of pericardial effusion.  Mitral Valve: The mitral valve is abnormal. Mild mitral annular calcification. Mild mitral valve regurgitation. No evidence of mitral valve stenosis.  Tricuspid Valve: The tricuspid valve is normal in structure. Tricuspid valve regurgitation is not demonstrated.  Aortic Valve: The aortic valve is tricuspid. There is mild calcification of the aortic valve. Aortic valve regurgitation is not visualized. No aortic stenosis is present.  Pulmonic Valve: The pulmonic valve was normal in structure. Pulmonic valve regurgitation is not visualized.  Aorta: The aortic root is normal in size and structure.  Venous: The inferior vena cava is normal in size with greater than 50% respiratory variability, suggesting right  atrial pressure of 3 mmHg.  IAS/Shunts: No atrial level shunt detected by color flow Doppler.   LEFT VENTRICLE PLAX 2D LVIDd:         4.10 cm   Diastology LVIDs:         2.70 cm   LV e' medial:    6.73 cm/s LV PW:         1.10 cm   LV E/e' medial:  14.0 LV IVS:        1.10 cm   LV e' lateral:   6.89 cm/s LVOT diam:     2.00 cm   LV E/e' lateral: 13.7 LV SV:         118 LV SV Index:   63        2D Longitudinal Strain LVOT Area:     3.14 cm  2D Strain GLS (A2C):   -20.5 % 2D Strain GLS (A3C):   -18.4 % 2D Strain GLS (A4C):   -22.5 % 2D Strain GLS Avg:     -20.5 %  3D Volume EF: 3D EF:        58 % LV EDV:       89 ml LV ESV:       37 ml LV SV:        52 ml  RIGHT VENTRICLE             IVC RV Basal diam:  2.40 cm     IVC diam: 1.50 cm RV S prime:     12.10 cm/s TAPSE (M-mode): 1.6 cm  LEFT ATRIUM  Index        RIGHT ATRIUM           Index LA diam:        4.50 cm 2.39 cm/m   RA Pressure: 3.00 mmHg LA Vol (A2C):   26.5 ml 14.05 ml/m  RA Area:     10.30 cm LA Vol (A4C):   43.3 ml 22.95 ml/m  RA Volume:   16.90 ml  8.96 ml/m LA Biplane Vol: 35.3 ml 18.71 ml/m AORTIC VALVE LVOT Vmax:   139.00 cm/s LVOT Vmean:  94.600 cm/s LVOT VTI:    0.377 m  AORTA Ao Root diam: 3.00 cm Ao Asc diam:  3.40 cm  MITRAL VALVE                TRICUSPID VALVE Estimated RAP:  3.00 mmHg MV Decel Time: 348 msec MV E velocity: 94.30 cm/s   SHUNTS MV A velocity: 120.00 cm/s  Systemic VTI:  0.38 m MV E/A ratio:  0.79         Systemic Diam: 2.00 cm  Dalton McleanMD Electronically signed by Franki Monte Signature Date/Time: 07/15/2021/4:23:56 PM    Final    MONITORS  LONG TERM MONITOR (3-14 DAYS) 02/09/2021  Narrative  Abnormal study  Agree with findings Predominant sinus rhythm. Episodes of a junctional rhythm. Symptomatic bradycardia with heart rates in the 40s in the afternoon. Ectopic atrial rhythm.  4 beats of NSVT. No sustained VT.   Patch Wear Time:  7 days  and 1 hours (2022-11-08T18:38:17-0500 to 2022-11-15T19:58:02-499)  Patient had a min HR of 37 bpm, max HR of 162 bpm, and avg HR of 51 bpm. Predominant underlying rhythm was Sinus Rhythm. Slight P wave morphology changes were noted. 1 run of Ventricular Tachycardia occurred lasting 4 beats with a max rate of 162 bpm (avg 155 bpm). Ectopic Atrial Rhythm was present. Junctional Rhythm was present. Ventricular Tachycardia, Ectopic Atrial Rhythm and Junctional Rhythm were detected within +/- 45 seconds of symptomatic patient event(s). Isolated SVEs were occasional (1.1%, 5867), SVE Couplets were rare (<1.0%, 148), and SVE Triplets were rare (<1.0%, 2). Isolated VEs were rare (<1.0%), and no VE Couplets or VE Triplets were present.             Recent Labs: 03/26/2021: TSH 4.08 10/08/2021: ALT 12; Hemoglobin 13.2; Platelets 169.0 12/13/2021: BUN 15; Creatinine, Ser 1.30; Potassium 4.0; Sodium 137  Recent Lipid Panel    Component Value Date/Time   CHOL 153 10/08/2021 1114   TRIG 185.0 (H) 10/08/2021 1114   HDL 37.00 (L) 10/08/2021 1114   CHOLHDL 4 10/08/2021 1114   VLDL 37.0 10/08/2021 1114   LDLCALC 79 10/08/2021 1114        Physical Exam:    VS:  BP (!) 140/70   Pulse (!) 51   Ht '5\' 2"'$  (1.575 m)   Wt 194 lb (88 kg)   SpO2 98%   BMI 35.48 kg/m     Wt Readings from Last 3 Encounters:  03/03/22 194 lb (88 kg)  02/18/22 195 lb (88.5 kg)  11/15/21 195 lb (88.5 kg)    Gen: No distress    Neck: No JVD Cardiac: No Rubs or Gallops, harsh systolic murmur, RRR +2 radial pulses Respiratory: Clear to auscultation bilaterally, normal effort, normal  respiratory rate GI: Soft, nontender, non-distended  MS: No  edema;  moves all extremities Integument: Skin feels well Neuro:  At time of evaluation, alert and oriented to person/place/time/situation  Psych: Normal affect, patient feels  in good spirits  ASSESSMENT:    No diagnosis found.  PLAN:    Hypertrophic Cardiomyopathy - NYHA  III - severe LVOT gradient (63 on stress echo) - would transition from nebvivolol (not optimatil for HCM and not covered by insurance  - she cannot tolerate higher doses of dispyramide 100 mg PO q6hr because of vagolytic symptoms (dry mouth and urinate retention); she feels poorly now - had hair loss with mavacamten - will need Septal reduction therapy or non-CMI clinic trial (potentially for ninerafaxstat vs trial of aficamten) - she will need CMR (scar burden of septum), LHC/RHC  and TEE (me) - After testing will be referring her to [facility] for evaluation of intervention - she has found a lot of benefit with the [facility] information we sent and joined an Facebook support group   HTN Vertigo - Goal to come off afterload reducing agents - she is followed by Dr. Beckie Busing and improved on dramamine as her Dr. Harl Wright   Time Spent Directly with Patient:   I have spent a total of 40 minutes with the patient reviewing notes, imaging, EKGs, labs and examining the patient as well as establishing an assessment and plan that was discussed personally with the patient.  > 50% of time was spent in direct patient care.             Medication Adjustments/Labs and Tests Ordered: Current medicines are reviewed at length with the patient today.  Concerns regarding medicines are outlined above.  No orders of the defined types were placed in this encounter.  No orders of the defined types were placed in this encounter.   There are no Patient Instructions on file for this visit.   Signed, Kylie Lean, MD  03/03/2022 12:23 PM    [place]

## 2022-03-03 ENCOUNTER — Ambulatory Visit: Payer: Medicaid Other | Attending: Internal Medicine | Admitting: Internal Medicine

## 2022-03-03 ENCOUNTER — Encounter: Payer: Self-pay | Admitting: Internal Medicine

## 2022-03-03 VITALS — BP 140/70 | HR 51 | Ht 62.0 in | Wt 194.0 lb

## 2022-03-03 DIAGNOSIS — I421 Obstructive hypertrophic cardiomyopathy: Secondary | ICD-10-CM | POA: Insufficient documentation

## 2022-03-03 DIAGNOSIS — E785 Hyperlipidemia, unspecified: Secondary | ICD-10-CM | POA: Diagnosis not present

## 2022-03-03 MED ORDER — SODIUM CHLORIDE 0.9% FLUSH: 3.0000 mL | Freq: Two times a day (BID) | INTRAVENOUS | Status: DC

## 2022-03-03 NOTE — Patient Instructions (Signed)
Medication Instructions: Your physician recommends that you continue on your current medications as directed. Please refer to the Current Medication list given to you today.    *If you need a refill on your cardiac medications before your next appointment, please call your pharmacy*   Lab Work: BMET, CBC If you have labs (blood work) drawn today and your tests are completely normal, you will receive your results only by: [place] (if you have MyChart) OR A paper copy in the mail If you have any lab test that is abnormal or we need to change your treatment, we will call you to review the results.   Testing/Procedures: Your physician has requested that you have a cardiac catheterization. Cardiac catheterization is used to diagnose and/or treat various heart conditions. Doctors may recommend this procedure for a number of different reasons. The most common reason is to evaluate chest pain. Chest pain can be a symptom of coronary artery disease (CAD), and cardiac catheterization can show whether plaque is narrowing or blocking your heart's arteries. This procedure is also used to evaluate the valves, as well as measure the blood flow and oxygen levels in different parts of your heart. For further information please visit [email or web address][email or web address]. Please follow instruction sheet, as given.     Follow-Up: At [facility], you and your health needs are our priority.  As part of our continuing mission to provide you with exceptional heart care, we have created designated Provider Care Teams.  These Care Teams include your primary Cardiologist (physician) and Advanced Practice Providers (APPs -  Physician Assistants and Nurse Practitioners) who all work together to provide you with the care you need, when you need it.   Your next appointment:   As directed after procedures  The format for your next appointment:   In Person  Provider:   Werner Lean, MD     Other  Instructions       Cardiac/Peripheral Catheterization   You are scheduled for a Cardiac Catheterization on Wednesday, January 10 with Dr. Peter Martinique.  1. Please arrive at the Main Entrance A at [facility]: [place], [place] [address] on January 10 at 6:30 AM (This time is two hours before your procedure to ensure your preparation). Free valet parking service is available. You will check in at ADMITTING. The support person will be asked to wait in the waiting room.  It is OK to have someone drop you off and come back when you are ready to be discharged.        Special note: Every effort is made to have your procedure done on time. Please understand that emergencies sometimes delay scheduled procedures.   . 2. Diet: Do not eat solid foods after midnight.  You may have clear liquids until 5 AM the day of the procedure.  3. Labs: You will need to have blood drawn on today  4. Medication instructions in preparation for your procedure:   Contrast Allergy: No    On the morning of your procedure, take Aspirin 81 mg and any morning medicines NOT listed above.  You may use sips of water.  Do not take hydrochlorothiazide that morning   5. Plan to go home the same day, you will only stay overnight if medically necessary. 6. You MUST have a responsible adult to drive you home. 7. An adult MUST be with you the first 24 hours after you arrive home. 8. Bring a current list of your medications,  and the last time and date medication taken. 9. Bring ID and current insurance cards. 10.Please wear clothes that are easy to get on and off and wear slip-on shoes.  Thank you for allowing [place] to care for you!   -- [facility]

## 2022-03-04 LAB — CBC
Hematocrit: 41.1 % (ref 34.0–46.6)
Hemoglobin: 13.7 g/dL (ref 11.1–15.9)
MCH: 33.3 pg — ABNORMAL HIGH (ref 26.6–33.0)
MCHC: 33.3 g/dL (ref 31.5–35.7)
MCV: 100 fL — ABNORMAL HIGH (ref 79–97)
Platelets: 198 10*3/uL (ref 150–450)
RBC: 4.12 x10E6/uL (ref 3.77–5.28)
RDW: 13.3 % (ref 11.7–15.4)
WBC: 12.1 10*3/uL — ABNORMAL HIGH (ref 3.4–10.8)

## 2022-03-04 LAB — BASIC METABOLIC PANEL
BUN/Creatinine Ratio: 16 (ref 9–23)
BUN: 26 mg/dL — ABNORMAL HIGH (ref 6–24)
CO2: 21 mmol/L (ref 20–29)
Calcium: 9.2 mg/dL (ref 8.7–10.2)
Chloride: 97 mmol/L (ref 96–106)
Creatinine, Ser: 1.65 mg/dL — ABNORMAL HIGH (ref 0.57–1.00)
Glucose: 95 mg/dL (ref 70–99)
Potassium: 4 mmol/L (ref 3.5–5.2)
Sodium: 135 mmol/L (ref 134–144)
eGFR: 36 mL/min/{1.73_m2} — ABNORMAL LOW (ref >59)

## 2022-03-08 ENCOUNTER — Telehealth: Payer: Self-pay

## 2022-03-08 DIAGNOSIS — I10 Essential (primary) hypertension: Secondary | ICD-10-CM

## 2022-03-08 MED ORDER — SPIRONOLACTONE 25 MG PO TABS: 25.0000 mg | ORAL_TABLET | Freq: Every day | ORAL | 3 refills | Status: DC

## 2022-03-08 NOTE — Telephone Encounter (Signed)
The patient has been notified of the result and verbalized understanding.  All questions (if any) were answered. Kylie Person Jariah Tarkowski, RN 03/08/2022 4:17 PM   Pt reports stopped taking spironolactone on 03/04/22.  Advised pt to take 25 mg PO QD and have repeat labs in 2 weeks. Pt will have labs drawn on 03/24/22.

## 2022-03-08 NOTE — Telephone Encounter (Signed)
-----   Message from Werner Lean, MD sent at 03/04/2022  1:47 PM EST ----- Results: Creatinine increase CBC is stable (WBC is elevated and similar to her prior trend) Plan: Cute back to aldactone 25 mg PO daily and BMP in two weeks  Werner Lean, MD

## 2022-03-24 ENCOUNTER — Ambulatory Visit: Payer: Medicaid Other | Attending: Internal Medicine

## 2022-03-24 DIAGNOSIS — I10 Essential (primary) hypertension: Secondary | ICD-10-CM

## 2022-03-25 LAB — BASIC METABOLIC PANEL
BUN/Creatinine Ratio: 13 (ref 9–23)
BUN: 19 mg/dL (ref 6–24)
CO2: 21 mmol/L (ref 20–29)
Calcium: 9.3 mg/dL (ref 8.7–10.2)
Chloride: 99 mmol/L (ref 96–106)
Creatinine, Ser: 1.48 mg/dL — ABNORMAL HIGH (ref 0.57–1.00)
Glucose: 92 mg/dL (ref 70–99)
Potassium: 4.6 mmol/L (ref 3.5–5.2)
Sodium: 134 mmol/L (ref 134–144)
eGFR: 41 mL/min/{1.73_m2} — ABNORMAL LOW (ref >59)

## 2022-03-28 ENCOUNTER — Telehealth: Payer: Self-pay

## 2022-03-28 MED ORDER — HYDROCHLOROTHIAZIDE 12.5 MG PO CAPS: 12.5000 mg | ORAL_CAPSULE | Freq: Every day | ORAL | 3 refills | Status: DC

## 2022-03-28 NOTE — Telephone Encounter (Signed)
-----   Message from Werner Lean, MD sent at 03/28/2022 11:15 AM EST ----- Results: Creatinine has improved  Plan: Decrease HCTZ to 12.5 mg daily; BMP in two weeks  Werner Lean, MD

## 2022-03-28 NOTE — Telephone Encounter (Signed)
The patient has been notified of the result and verbalized understanding.  All questions (if any) were answered. Precious Gilding, RN 03/28/2022 11:48 AM   Pt will have f/u labs drawn at PCP office and have results faxed to MD.

## 2022-03-29 ENCOUNTER — Telehealth: Payer: Self-pay | Admitting: *Deleted

## 2022-03-29 NOTE — Telephone Encounter (Signed)
Cardiac Catheterization scheduled at [facility] for: Wednesday March 30, 2022 1 PM/TEE 11:30 AM Arrival time and place: [place] Entrance A at:  8 AM-pre-procedure hydration-per protocol GFR 41  Nothing to eat or drink after midnight prior to procedures, may have sips of water to take medications.  Medication instructions: -Hold:  Spironolactone/HCTZ/KCl-day before and day of procedure-per protocol GFR 41-pt already taken today -Except hold medications usual morning medications can be taken with sips of water.   *Patient takes disopyramide (Norpace) 100 mg every 6 hours (8-2-8-2)   Patient will take 8 AM dose right before leaving home, will take 2 PM dose with her to hospital-will let staff know she has medication.  .  Confirmed patient has responsible adult to drive home post procedure and be with patient first 24 hours after arriving home.  Patient reports no new symptoms concerning for COVID-19 in the past 10 days.  Reviewed procedure instructions with patient.

## 2022-03-30 ENCOUNTER — Ambulatory Visit (HOSPITAL_BASED_OUTPATIENT_CLINIC_OR_DEPARTMENT_OTHER)
Admission: RE | Admit: 2022-03-30 | Discharge: 2022-03-30 | Disposition: A | Discharge status: Home / Self Care | Payer: Medicaid Other | Source: Ambulatory Visit | Attending: Internal Medicine | Admitting: Internal Medicine

## 2022-03-30 ENCOUNTER — Encounter (HOSPITAL_COMMUNITY)
Admission: RE | Disposition: A | Discharge status: Home / Self Care | Payer: Self-pay | Source: Home / Self Care | Attending: Internal Medicine

## 2022-03-30 ENCOUNTER — Encounter (HOSPITAL_COMMUNITY): Payer: Self-pay | Admitting: Internal Medicine

## 2022-03-30 ENCOUNTER — Ambulatory Visit (HOSPITAL_COMMUNITY): Payer: Medicaid Other | Admitting: Certified Registered"

## 2022-03-30 ENCOUNTER — Ambulatory Visit (HOSPITAL_BASED_OUTPATIENT_CLINIC_OR_DEPARTMENT_OTHER): Payer: Medicaid Other | Admitting: Certified Registered"

## 2022-03-30 ENCOUNTER — Ambulatory Visit (HOSPITAL_COMMUNITY)
Admission: RE | Admit: 2022-03-30 | Discharge: 2022-03-30 | Disposition: A | Discharge status: Home / Self Care | Payer: Medicaid Other | Attending: Internal Medicine | Admitting: Internal Medicine

## 2022-03-30 DIAGNOSIS — I421 Obstructive hypertrophic cardiomyopathy: Secondary | ICD-10-CM | POA: Insufficient documentation

## 2022-03-30 DIAGNOSIS — R42 Dizziness and giddiness: Secondary | ICD-10-CM | POA: Insufficient documentation

## 2022-03-30 DIAGNOSIS — I422 Other hypertrophic cardiomyopathy: Secondary | ICD-10-CM | POA: Diagnosis not present

## 2022-03-30 DIAGNOSIS — I10 Essential (primary) hypertension: Secondary | ICD-10-CM | POA: Insufficient documentation

## 2022-03-30 DIAGNOSIS — F17210 Nicotine dependence, cigarettes, uncomplicated: Secondary | ICD-10-CM | POA: Diagnosis not present

## 2022-03-30 DIAGNOSIS — I340 Nonrheumatic mitral (valve) insufficiency: Secondary | ICD-10-CM | POA: Insufficient documentation

## 2022-03-30 DIAGNOSIS — Z79899 Other long term (current) drug therapy: Secondary | ICD-10-CM | POA: Diagnosis not present

## 2022-03-30 DIAGNOSIS — I2510 Atherosclerotic heart disease of native coronary artery without angina pectoris: Secondary | ICD-10-CM | POA: Insufficient documentation

## 2022-03-30 HISTORY — PX: TEE WITHOUT CARDIOVERSION: SHX5443

## 2022-03-30 HISTORY — PX: RIGHT/LEFT HEART CATH AND CORONARY ANGIOGRAPHY: CATH118266

## 2022-03-30 LAB — ECHO TEE

## 2022-03-30 SURGERY — ECHOCARDIOGRAM, TRANSESOPHAGEAL
Anesthesia: Monitor Anesthesia Care

## 2022-03-30 SURGERY — RIGHT/LEFT HEART CATH AND CORONARY ANGIOGRAPHY
Anesthesia: LOCAL

## 2022-03-30 MED ORDER — LABETALOL HCL 5 MG/ML IV SOLN: 10.0000 mg | INTRAVENOUS | Status: DC | PRN

## 2022-03-30 MED ORDER — LIDOCAINE HCL (PF) 1 % IJ SOLN
INTRAMUSCULAR | Status: DC | PRN
  Administered 2022-03-30 (×2): 2 mL via INTRADERMAL

## 2022-03-30 MED ORDER — SODIUM CHLORIDE 0.9 % IV SOLN
INTRAVENOUS | Status: DC | PRN
  Administered 2022-03-30: 10 mL/h via INTRAVENOUS

## 2022-03-30 MED ORDER — SODIUM CHLORIDE 0.9% FLUSH: 3.0000 mL | Freq: Two times a day (BID) | INTRAVENOUS | Status: DC

## 2022-03-30 MED ORDER — HEPARIN (PORCINE) IN NACL 1000-0.9 UT/500ML-% IV SOLN
INTRAVENOUS | Status: DC | PRN
  Administered 2022-03-30 (×2): 500 mL

## 2022-03-30 MED ORDER — HYDRALAZINE HCL 20 MG/ML IJ SOLN
10.0000 mg | INTRAMUSCULAR | Status: DC | PRN
  Filled 2022-03-30: qty 1

## 2022-03-30 MED ORDER — IOHEXOL 350 MG/ML SOLN
INTRAVENOUS | Status: DC | PRN
  Administered 2022-03-30: 50 mL

## 2022-03-30 MED ORDER — SODIUM CHLORIDE 0.9 % IV SOLN: 250.0000 mL | INTRAVENOUS | Status: DC | PRN

## 2022-03-30 MED ORDER — HEPARIN (PORCINE) IN NACL 1000-0.9 UT/500ML-% IV SOLN
INTRAVENOUS | Status: AC
  Filled 2022-03-30: qty 1000

## 2022-03-30 MED ORDER — ONDANSETRON HCL 4 MG/2ML IJ SOLN: 4.0000 mg | Freq: Four times a day (QID) | INTRAMUSCULAR | Status: DC | PRN

## 2022-03-30 MED ORDER — HYDRALAZINE HCL 20 MG/ML IJ SOLN
INTRAMUSCULAR | Status: AC
  Administered 2022-03-30: 10 mg via INTRAVENOUS
  Filled 2022-03-30: qty 1

## 2022-03-30 MED ORDER — DIAZEPAM 5 MG PO TABS: 5.0000 mg | ORAL_TABLET | Freq: Four times a day (QID) | ORAL | Status: DC | PRN

## 2022-03-30 MED ORDER — SODIUM CHLORIDE 0.9 % IV SOLN: INTRAVENOUS | Status: DC

## 2022-03-30 MED ORDER — LIDOCAINE HCL (CARDIAC) PF 100 MG/5ML IV SOSY
PREFILLED_SYRINGE | INTRAVENOUS | Status: DC | PRN
  Administered 2022-03-30: 20 mg via INTRAVENOUS

## 2022-03-30 MED ORDER — MIDAZOLAM HCL 2 MG/2ML IJ SOLN
INTRAMUSCULAR | Status: AC
  Filled 2022-03-30: qty 2

## 2022-03-30 MED ORDER — PROPOFOL 500 MG/50ML IV EMUL
INTRAVENOUS | Status: DC | PRN
  Administered 2022-03-30: 150 ug/kg/min via INTRAVENOUS

## 2022-03-30 MED ORDER — HEPARIN SODIUM (PORCINE) 1000 UNIT/ML IJ SOLN
INTRAMUSCULAR | Status: AC
  Filled 2022-03-30: qty 10

## 2022-03-30 MED ORDER — ACETAMINOPHEN 325 MG PO TABS: 650.0000 mg | ORAL_TABLET | ORAL | Status: DC | PRN

## 2022-03-30 MED ORDER — SODIUM CHLORIDE 0.9% FLUSH: 3.0000 mL | INTRAVENOUS | Status: DC | PRN

## 2022-03-30 MED ORDER — HEPARIN SODIUM (PORCINE) 1000 UNIT/ML IJ SOLN
INTRAMUSCULAR | Status: DC | PRN
  Administered 2022-03-30: 4500 [IU] via INTRAVENOUS

## 2022-03-30 MED ORDER — FENTANYL CITRATE (PF) 100 MCG/2ML IJ SOLN
INTRAMUSCULAR | Status: AC
  Filled 2022-03-30: qty 2

## 2022-03-30 MED ORDER — FENTANYL CITRATE (PF) 100 MCG/2ML IJ SOLN
INTRAMUSCULAR | Status: DC | PRN
  Administered 2022-03-30 (×2): 25 ug via INTRAVENOUS

## 2022-03-30 MED ORDER — SODIUM CHLORIDE 0.9 % WEIGHT BASED INFUSION
3.0000 mL/kg/h | INTRAVENOUS | Status: AC
  Administered 2022-03-30: 3 mL/kg/h via INTRAVENOUS

## 2022-03-30 MED ORDER — LIDOCAINE HCL (PF) 1 % IJ SOLN
INTRAMUSCULAR | Status: AC
  Filled 2022-03-30: qty 30

## 2022-03-30 MED ORDER — SODIUM CHLORIDE 0.9 % WEIGHT BASED INFUSION
1.0000 mL/kg/h | INTRAVENOUS | Status: DC
  Administered 2022-03-30: 500 mL via INTRAVENOUS
  Administered 2022-03-30: 300 mL via INTRAVENOUS

## 2022-03-30 MED ORDER — VERAPAMIL HCL 2.5 MG/ML IV SOLN
INTRAVENOUS | Status: DC | PRN
  Administered 2022-03-30: 10 mL via INTRA_ARTERIAL

## 2022-03-30 MED ORDER — MIDAZOLAM HCL 2 MG/2ML IJ SOLN
INTRAMUSCULAR | Status: DC | PRN
  Administered 2022-03-30 (×2): 1 mg via INTRAVENOUS

## 2022-03-30 MED ORDER — ASPIRIN 81 MG PO CHEW: 81.0000 mg | CHEWABLE_TABLET | ORAL | Status: DC

## 2022-03-30 MED ORDER — VERAPAMIL HCL 2.5 MG/ML IV SOLN
INTRAVENOUS | Status: AC
  Filled 2022-03-30: qty 2

## 2022-03-30 SURGICAL SUPPLY — 14 items
CATH 5FR JL3.5 JR4 ANG PIG MP (CATHETERS) IMPLANT
CATH INFINITI 5FR JL4 (CATHETERS) IMPLANT
CATH SWAN GANZ 7F STRAIGHT (CATHETERS) IMPLANT
DEVICE RAD COMP TR BAND LRG (VASCULAR PRODUCTS) IMPLANT
GLIDESHEATH SLEND SS 6F .021 (SHEATH) IMPLANT
GLIDESHEATH SLENDER 7FR .021G (SHEATH) IMPLANT
GUIDEWIRE .025 260CM (WIRE) IMPLANT
GUIDEWIRE INQWIRE 1.5J.035X260 (WIRE) IMPLANT
INQWIRE 1.5J .035X260CM (WIRE) ×1
KIT HEART LEFT (KITS) ×1 IMPLANT
PACK CARDIAC CATHETERIZATION (CUSTOM PROCEDURE TRAY) ×1 IMPLANT
SYR MEDRAD MARK 7 150ML (SYRINGE) ×1 IMPLANT
TRANSDUCER W/STOPCOCK (MISCELLANEOUS) ×1 IMPLANT
TUBING CIL FLEX 10 FLL-RA (TUBING) ×1 IMPLANT

## 2022-03-30 NOTE — Interval H&P Note (Signed)
History and Physical Interval Note:  03/30/2022 10:49 AM  Kylie Wright  has presented today for surgery, with the diagnosis of HYPERTROPHIC CARDIO MYOPATHY.  The various methods of treatment have been discussed with the patient and family. After consideration of risks, benefits and other options for treatment, the patient has consented to  Procedure(s): TRANSESOPHAGEAL ECHOCARDIOGRAM (TEE) (N/A) as a surgical intervention.  The patient's history has been reviewed, patient examined, no change in status, stable for surgery.  I have reviewed the patient's chart and labs.  Questions were answered to the patient's satisfaction.     Gearld Kerstein A Lynann Demetrius

## 2022-03-30 NOTE — Anesthesia Procedure Notes (Signed)
Procedure Name: MAC Date/Time: 03/30/2022 12:00 PM  Performed by: Gwyndolyn Saxon, CRNAPre-anesthesia Checklist: Patient identified, Emergency Drugs available, Suction available, Patient being monitored and Timeout performed Patient Re-evaluated:Patient Re-evaluated prior to induction Oxygen Delivery Method: Nasal cannula Induction Type: IV induction Placement Confirmation: positive ETCO2 and breath sounds checked- equal and bilateral Dental Injury: Teeth and Oropharynx as per pre-operative assessment

## 2022-03-30 NOTE — Progress Notes (Signed)
  Echocardiogram Echocardiogram Transesophageal has been performed.  Kylie Wright 03/30/2022, 12:48 PM

## 2022-03-30 NOTE — Transfer of Care (Signed)
Immediate Anesthesia Transfer of Care Note  Patient: Kylie Wright  Procedure(s) Performed: TRANSESOPHAGEAL ECHOCARDIOGRAM (TEE)  Patient Location: PACU and Endoscopy Unit  Anesthesia Type:MAC  Level of Consciousness: drowsy  Airway & Oxygen Therapy: Patient Spontanous Breathing and Patient connected to nasal cannula oxygen  Post-op Assessment: Report given to RN and Post -op Vital signs reviewed and stable  Post vital signs: Reviewed and stable  Last Vitals:  Vitals Value Taken Time  BP 107/72 03/30/22 1230  Temp    Pulse 42 03/30/22 1232  Resp 15 03/30/22 1232  SpO2 95 % 03/30/22 1232  Vitals shown include unvalidated device data.  Last Pain:  Vitals:   03/30/22 0832  TempSrc:   PainSc: 0-No pain         Complications: No notable events documented.

## 2022-03-30 NOTE — Anesthesia Procedure Notes (Signed)
Arterial Line Insertion Start/End1/12/2022 9:55 AM, 03/30/2022 10:05 AM Performed by: Wilburn Cornelia, CRNA  Patient location: Pre-op. Preanesthetic checklist: patient identified, IV checked, site marked, risks and benefits discussed, surgical consent, monitors and equipment checked, pre-op evaluation, timeout performed and anesthesia consent Lidocaine 1% used for infiltration Left was placed Catheter size: 20 G Hand hygiene performed  and maximum sterile barriers used  Allen's test indicative of satisfactory collateral circulation Attempts: 1 Procedure performed without using ultrasound guided technique. Following insertion, Biopatch and dressing applied. Post procedure assessment: normal  Patient tolerated the procedure well with no immediate complications.

## 2022-03-30 NOTE — Discharge Instructions (Signed)

## 2022-03-30 NOTE — Anesthesia Preprocedure Evaluation (Addendum)
Anesthesia Evaluation  Patient identified by MRN, date of birth, ID band Patient awake    Reviewed: Allergy & Precautions, NPO status , Patient's Chart, lab work & pertinent test results  History of Anesthesia Complications Negative for: history of anesthetic complications  Airway Mallampati: III  TM Distance: >3 FB Neck ROM: Full    Dental  (+) Dental Advisory Given   Pulmonary neg shortness of breath, neg sleep apnea, neg COPD, neg recent URI, Current Smoker and Patient abstained from smoking., former smoker   Pulmonary exam normal breath sounds clear to auscultation       Cardiovascular hypertension, (-) angina (-) Past MI, (-) Cardiac Stents and (-) CABG  Rhythm:Regular Rate:Normal  HLD, HOCM  TTE 07/15/2021: IMPRESSIONS     1. Left ventricular ejection fraction, by estimation, is 65 to 70%. The  left ventricle has hyperdynamic function. The left ventricle has no  regional wall motion abnormalities. Severe asymmetric basal septal left  ventricular hypertrophy. There is mild  systolic anterior motion of the mitral valve. Peak LV outflow tract  gradient was 35 mmHg with Valsalva. Left ventricular diastolic parameters  are consistent with Grade I diastolic dysfunction (impaired relaxation).  The average left ventricular global  longitudinal strain is -20.5 %. The global longitudinal strain is normal.   2. Right ventricular systolic function is normal. The right ventricular  size is normal. Tricuspid regurgitation signal is inadequate for assessing  PA pressure.   3. Left atrial size was mildly dilated.   4. The mitral valve is abnormal with mild systolic anterior motion. Mild  mitral valve regurgitation. No evidence of mitral stenosis.   5. The aortic valve is tricuspid. There is mild calcification of the  aortic valve. Aortic valve regurgitation is not visualized. No aortic  stenosis is present.   6. The inferior vena  cava is normal in size with greater than 50%  respiratory variability, suggesting right atrial pressure of 3 mmHg.     Neuro/Psych  PSYCHIATRIC DISORDERS (ADHD, PTSD) Anxiety  Bipolar Disorder      GI/Hepatic negative GI ROS,,,(+)     substance abuse  alcohol use  Endo/Other  negative endocrine ROS  prediabetes  Renal/GU negative Renal ROS   Interstitial cystitis    Musculoskeletal  (+) Arthritis , Osteoarthritis,    Abdominal  (+) + obese  Peds  Hematology negative hematology ROS (+)   Anesthesia Other Findings   Reproductive/Obstetrics                             Anesthesia Physical Anesthesia Plan  ASA: 4  Anesthesia Plan: MAC   Post-op Pain Management: Minimal or no pain anticipated   Induction: Intravenous  PONV Risk Score and Plan: 1 and Propofol infusion and Treatment may vary due to age or medical condition  Airway Management Planned: Natural Airway and Simple Face Mask  Additional Equipment: Arterial line  Intra-op Plan:   Post-operative Plan:   Informed Consent: I have reviewed the patients History and Physical, chart, labs and discussed the procedure including the risks, benefits and alternatives for the proposed anesthesia with the patient or authorized representative who has indicated his/her understanding and acceptance.     Dental advisory given  Plan Discussed with: CRNA and Anesthesiologist  Anesthesia Plan Comments: (Discussed with patient risks of MAC including, but not limited to, minor pain or discomfort, hearing people in the room, and possible need for backup general anesthesia. Risks for general  anesthesia also discussed including, but not limited to, sore throat, hoarse voice, chipped/damaged teeth, injury to vocal cords, nausea and vomiting, allergic reactions, lung infection, heart attack, stroke, and death. All questions answered. )        Anesthesia Quick Evaluation

## 2022-03-30 NOTE — Anesthesia Postprocedure Evaluation (Signed)
Anesthesia Post Note  Patient: Kylie Wright  Procedure(s) Performed: TRANSESOPHAGEAL ECHOCARDIOGRAM (TEE)     Patient location during evaluation: PACU Anesthesia Type: MAC Level of consciousness: awake Pain management: pain level controlled Vital Signs Assessment: post-procedure vital signs reviewed and stable Respiratory status: spontaneous breathing, nonlabored ventilation and respiratory function stable Cardiovascular status: stable and blood pressure returned to baseline Postop Assessment: no apparent nausea or vomiting Anesthetic complications: no   No notable events documented.  Last Vitals:  Vitals:   03/30/22 1240 03/30/22 1300  BP: 108/77 114/61  Pulse: (!) 41 (!) 43  Resp: 18 10  Temp:    SpO2: 98% 97%    Last Pain:  Vitals:   03/30/22 1300  TempSrc:   PainSc: 0-No pain                 Nilda Simmer

## 2022-03-30 NOTE — Progress Notes (Signed)
Patient transferred to cath lab RM 3 .  Patient awake, alert, and oriented x4.  No complaints of pain.  A-Line in place.  Care of patient taken over by Cath lab Team.

## 2022-03-30 NOTE — Interval H&P Note (Signed)
Cath Lab Visit (complete for each Cath Lab visit)  Clinical Evaluation Leading to the Procedure:   ACS: No.  Non-ACS:    Anginal Classification: CCS I  Anti-ischemic medical therapy: No Therapy  Non-Invasive Test Results: Equivocal test results  Prior CABG: No previous CABG      History and Physical Interval Note:  03/30/2022 2:02 PM  Kylie Wright  has presented today for surgery, with the diagnosis of obstructive hypertropic cardiomyopathy.  The various methods of treatment have been discussed with the patient and family. After consideration of risks, benefits and other options for treatment, the patient has consented to  Procedure(s): RIGHT/LEFT HEART CATH AND CORONARY ANGIOGRAPHY (N/A) as a surgical intervention.  The patient's history has been reviewed, patient examined, no change in status, stable for surgery.  I have reviewed the patient's chart and labs.  Questions were answered to the patient's satisfaction.     Shelva Majestic

## 2022-03-31 ENCOUNTER — Encounter (HOSPITAL_COMMUNITY): Payer: Self-pay | Admitting: Cardiovascular Disease

## 2022-03-31 LAB — POCT I-STAT 7, (LYTES, BLD GAS, ICA,H+H)
Acid-base deficit: 1 mmol/L (ref 0.0–2.0)
Bicarbonate: 24.4 mmol/L (ref 20.0–28.0)
Calcium, Ion: 1.16 mmol/L (ref 1.15–1.40)
HCT: 34 % — ABNORMAL LOW (ref 36.0–46.0)
Hemoglobin: 11.6 g/dL — ABNORMAL LOW (ref 12.0–15.0)
O2 Saturation: 96 %
Potassium: 3.9 mmol/L (ref 3.5–5.1)
Sodium: 142 mmol/L (ref 135–145)
TCO2: 26 mmol/L (ref 22–32)
pCO2 arterial: 41.8 mmHg (ref 32–48)
pH, Arterial: 7.374 (ref 7.35–7.45)
pO2, Arterial: 84 mmHg (ref 83–108)

## 2022-03-31 LAB — POCT I-STAT EG7
Acid-Base Excess: 0 mmol/L (ref 0.0–2.0)
Acid-Base Excess: 1 mmol/L (ref 0.0–2.0)
Bicarbonate: 25.5 mmol/L (ref 20.0–28.0)
Bicarbonate: 26.8 mmol/L (ref 20.0–28.0)
Calcium, Ion: 1.18 mmol/L (ref 1.15–1.40)
Calcium, Ion: 1.17 mmol/L (ref 1.15–1.40)
HCT: 34 % — ABNORMAL LOW (ref 36.0–46.0)
HCT: 34 % — ABNORMAL LOW (ref 36.0–46.0)
Hemoglobin: 11.6 g/dL — ABNORMAL LOW (ref 12.0–15.0)
Hemoglobin: 11.6 g/dL — ABNORMAL LOW (ref 12.0–15.0)
O2 Saturation: 60 %
O2 Saturation: 60 %
Potassium: 4 mmol/L (ref 3.5–5.1)
Potassium: 3.9 mmol/L (ref 3.5–5.1)
Sodium: 142 mmol/L (ref 135–145)
Sodium: 142 mmol/L (ref 135–145)
TCO2: 27 mmol/L (ref 22–32)
TCO2: 28 mmol/L (ref 22–32)
pCO2, Ven: 45.2 mmHg (ref 44–60)
pCO2, Ven: 46.7 mmHg (ref 44–60)
pH, Ven: 7.359 (ref 7.25–7.43)
pH, Ven: 7.366 (ref 7.25–7.43)
pO2, Ven: 33 mmHg (ref 32–45)
pO2, Ven: 33 mmHg (ref 32–45)

## 2022-04-02 ENCOUNTER — Encounter (HOSPITAL_COMMUNITY): Payer: Self-pay | Admitting: Internal Medicine

## 2022-04-08 DIAGNOSIS — Z23 Encounter for immunization: Secondary | ICD-10-CM | POA: Diagnosis not present

## 2022-04-14 ENCOUNTER — Other Ambulatory Visit: Payer: Self-pay | Admitting: Obstetrics

## 2022-04-14 ENCOUNTER — Encounter: Payer: Self-pay | Admitting: Internal Medicine

## 2022-04-14 DIAGNOSIS — Z1231 Encounter for screening mammogram for malignant neoplasm of breast: Secondary | ICD-10-CM

## 2022-04-14 NOTE — Progress Notes (Signed)
Subjective:    Patient ID: Kylie Wright, female    DOB: [birth year], 57 y.o.   MRN: [id number]      HPI Kylie Wright is here for a Physical exam.    Quit smoking cold [place] 03/29/22.   Has tooth that needs to be removed.  She does not have pain. Will either pay for crown or see a different dentist in [place].  If she needs pain medication she may need to asked me per her dentist    Has insurance - making appts Has gyn - Dr Jodi Mourning, has mammo schedule [facility] - has appt    Medications and allergies reviewed with patient and updated if appropriate.  Current Outpatient Medications on File Prior to Visit  Medication Sig Dispense Refill   amphetamine-dextroamphetamine (ADDERALL XR) 20 MG 24 hr capsule Take 20 mg by mouth every morning.  0   disopyramide (NORPACE) 100 MG capsule Take 1 capsule (100 mg total) by mouth 4 (four) times daily. 360 capsule 3   docusate sodium (COLACE) 100 MG capsule Take 100 mg by mouth at bedtime. Can take up to 3 tabs per day as needed     hydrochlorothiazide (MICROZIDE) 12.5 MG capsule Take 1 capsule (12.5 mg total) by mouth daily. 90 capsule 3   meclizine (DRAMAMINE) 25 MG tablet Take 25 mg by mouth 3 (three) times daily as needed for dizziness.     Melatonin 10 MG TABS Take 10 mg by mouth at bedtime.     meloxicam (MOBIC) 7.5 MG tablet TAKE 1 TABLET BY MOUTH TWICE  DAILY AS NEEDED FOR PAIN 180 tablet 3   nebivolol (BYSTOLIC) 10 MG tablet Take 1 tablet (10 mg total) by mouth daily. 90 tablet 1   OLANZapine-Samidorphan 5-10 MG TABS Take 1 tablet by mouth daily.     Olopatadine HCl (PAZEO) 0.7 % SOLN Place 1 drop into both eyes daily as needed (allergies).     Potassium 99 MG TABS Take 99 mg by mouth daily.     ROCKLATAN 0.02-0.005 % SOLN SMARTSIG:1 Drop(s) In Eye(s) Every Evening     rosuvastatin (CRESTOR) 10 MG tablet Take 1 tablet (10 mg total) by mouth daily. 90 tablet 2   solifenacin (VESICARE) 10 MG tablet Take 10 mg by mouth daily.  3    spironolactone (ALDACTONE) 25 MG tablet Take 1 tablet (25 mg total) by mouth daily. 90 tablet 3   Current Facility-Administered Medications on File Prior to Visit  Medication Dose Route Frequency Provider Last Rate Last Admin   sodium chloride flush (NS) 0.9 % injection 3 mL  3 mL Intravenous Q12H Chandrasekhar, Mahesh A, MD        Review of Systems  Constitutional:  Negative for chills and fever.  Eyes:  Negative for visual disturbance.  Respiratory:  Positive for shortness of breath. Negative for cough and wheezing.   Cardiovascular:  Positive for chest pain and palpitations. Negative for leg swelling.  Gastrointestinal:  Positive for constipation. Negative for abdominal pain, blood in stool and diarrhea.       No gerd  Genitourinary:  Positive for dysuria (from IC).       Bladder pains from IC  Musculoskeletal:  Positive for arthralgias. Negative for back pain.  Skin:  Negative for rash.  Neurological:  Positive for headaches (occ).  Psychiatric/Behavioral:  Positive for dysphoric mood. The patient is nervous/anxious.        Objective:   Vitals:   04/15/22 1016  BP: 138/70  Pulse: (!) 53  Temp: 98.6 F (37 C)  SpO2: 98%   Filed Weights   04/15/22 1016  Weight: 197 lb (89.4 kg)   Body mass index is 36.03 kg/m.  BP Readings from Last 3 Encounters:  04/15/22 138/70  03/30/22 130/62  03/03/22 (!) 140/70    Wt Readings from Last 3 Encounters:  04/15/22 197 lb (89.4 kg)  03/30/22 195 lb (88.5 kg)  03/03/22 194 lb (88 kg)       Physical Exam Constitutional: She appears well-developed and well-nourished. No distress.  HENT:  Head: Normocephalic and atraumatic.  Right Ear: External ear normal. Normal ear canal and TM Left Ear: External ear normal.  Normal ear canal and TM Mouth/Throat: Oropharynx is clear and moist.  Eyes: Conjunctivae normal.  Neck: Neck supple. No tracheal deviation present. No thyromegaly present.  No carotid bruit  Cardiovascular: Normal  rate, regular rhythm and normal heart sounds.   No murmur heard.  No edema. Pulmonary/Chest: Effort normal and breath sounds normal. No respiratory distress. She has no wheezes. She has no rales.  Breast: deferred   Abdominal: Soft. She exhibits no distension. There is no tenderness.  Lymphadenopathy: She has no cervical adenopathy.  Skin: Skin is warm and dry. She is not diaphoretic.  Psychiatric: She has a normal mood and affect. Her behavior is normal.     Lab Results  Component Value Date   WBC 11.9 (H) 04/15/2022   HGB 12.8 04/15/2022   HCT 37.4 04/15/2022   PLT 166.0 04/15/2022   GLUCOSE 108 (H) 04/15/2022   CHOL 147 04/15/2022   TRIG 131.0 04/15/2022   HDL 41.80 04/15/2022   LDLCALC 79 04/15/2022   ALT 12 04/15/2022   AST 17 04/15/2022   NA 135 04/15/2022   K 3.7 04/15/2022   CL 102 04/15/2022   CREATININE 1.50 (H) 04/15/2022   BUN 19 04/15/2022   CO2 26 04/15/2022   TSH 4.99 04/15/2022   HGBA1C 5.8 04/15/2022         Assessment & Plan:   Physical exam: Screening blood work  ordered Exercise  none Weight  unable to work on weight loss now Substance abuse  none - congratulated on quitting smoking   Reviewed recommended immunizations.  Flu vaccine given today   Health Maintenance  Topic Date Due   INFLUENZA VACCINE  10/19/2021   COVID-19 Vaccine (5 - 2023-24 season) 11/19/2021   MAMMOGRAM  06/15/2023   Fecal DNA (Cologuard)  08/29/2023   PAP SMEAR-Modifier  11/28/2023   DTaP/Tdap/Td (2 - Td or Tdap) 07/23/2026   Hepatitis C Screening  Completed   HIV Screening  Completed   Zoster Vaccines- Shingrix  Completed   HPV VACCINES  Aged Out          See Problem List for Assessment and Plan of chronic medical problems.

## 2022-04-14 NOTE — Patient Instructions (Addendum)
Flu vaccine today    Blood work was ordered.   The lab is on the first floor.    Medications changes include :   none     Return in about 6 months (around 10/14/2022) for follow up.    Health Maintenance, Female Adopting a healthy lifestyle and getting preventive care are important in promoting health and wellness. Ask your health care provider about: The right schedule for you to have regular tests and exams. Things you can do on your own to prevent diseases and keep yourself healthy. What should I know about diet, weight, and exercise? Eat a healthy diet  Eat a diet that includes plenty of vegetables, fruits, low-fat dairy products, and lean protein. Do not eat a lot of foods that are high in solid fats, added sugars, or sodium. Maintain a healthy weight Body mass index (BMI) is used to identify weight problems. It estimates body fat based on height and weight. Your health care provider can help determine your BMI and help you achieve or maintain a healthy weight. Get regular exercise Get regular exercise. This is one of the most important things you can do for your health. Most adults should: Exercise for at least 150 minutes each week. The exercise should increase your heart rate and make you sweat (moderate-intensity exercise). Do strengthening exercises at least twice a week. This is in addition to the moderate-intensity exercise. Spend less time sitting. Even light physical activity can be beneficial. Watch cholesterol and blood lipids Have your blood tested for lipids and cholesterol at 58 years of age, then have this test every 5 years. Have your cholesterol levels checked more often if: Your lipid or cholesterol levels are high. You are older than 58 years of age. You are at high risk for heart disease. What should I know about cancer screening? Depending on your health history and family history, you may need to have cancer screening at various ages. This may  include screening for: Breast cancer. Cervical cancer. Colorectal cancer. Skin cancer. Lung cancer. What should I know about heart disease, diabetes, and high blood pressure? Blood pressure and heart disease High blood pressure causes heart disease and increases the risk of stroke. This is more likely to develop in people who have high blood pressure readings or are overweight. Have your blood pressure checked: Every 3-5 years if you are 42-33 years of age. Every year if you are 62 years old or older. Diabetes Have regular diabetes screenings. This checks your fasting blood sugar level. Have the screening done: Once every three years after age 38 if you are at a normal weight and have a low risk for diabetes. More often and at a younger age if you are overweight or have a high risk for diabetes. What should I know about preventing infection? Hepatitis B If you have a higher risk for hepatitis B, you should be screened for this virus. Talk with your health care provider to find out if you are at risk for hepatitis B infection. Hepatitis C Testing is recommended for: Everyone born from [birth year] through 1966. Anyone with known risk factors for hepatitis C. Sexually transmitted infections (STIs) Get screened for STIs, including gonorrhea and chlamydia, if: You are sexually active and are younger than 58 years of age. You are older than 58 years of age and your health care provider tells you that you are at risk for this type of infection. Your sexual activity has changed since you were last screened,  and you are at increased risk for chlamydia or gonorrhea. Ask your health care provider if you are at risk. Ask your health care provider about whether you are at high risk for HIV. Your health care provider may recommend a prescription medicine to help prevent HIV infection. If you choose to take medicine to prevent HIV, you should first get tested for HIV. You should then be tested every 3 months  for as long as you are taking the medicine. Pregnancy If you are about to stop having your period (premenopausal) and you may become pregnant, seek counseling before you get pregnant. Take 400 to 800 micrograms (mcg) of folic acid every day if you become pregnant. Ask for birth control (contraception) if you want to prevent pregnancy. Osteoporosis and menopause Osteoporosis is a disease in which the bones lose minerals and strength with aging. This can result in bone fractures. If you are 50 years old or older, or if you are at risk for osteoporosis and fractures, ask your health care provider if you should: Be screened for bone loss. Take a calcium or vitamin D supplement to lower your risk of fractures. Be given hormone replacement therapy (HRT) to treat symptoms of menopause. Follow these instructions at home: Alcohol use Do not drink alcohol if: Your health care provider tells you not to drink. You are pregnant, may be pregnant, or are planning to become pregnant. If you drink alcohol: Limit how much you have to: 0-1 drink a day. Know how much alcohol is in your drink. In the [place] one drink equals one 12 oz bottle of beer (355 mL), one 5 oz glass of wine (148 mL), or one 1 oz glass of hard liquor (44 mL). Lifestyle Do not use any products that contain nicotine or tobacco. These products include cigarettes, chewing tobacco, and vaping devices, such as e-cigarettes. If you need help quitting, ask your health care provider. Do not use street drugs. Do not share needles. Ask your health care provider for help if you need support or information about quitting drugs. General instructions Schedule regular health, dental, and eye exams. Stay current with your vaccines. Tell your health care provider if: You often feel depressed. You have ever been abused or do not feel safe at home. Summary Adopting a healthy lifestyle and getting preventive care are important in promoting health and  wellness. Follow your health care provider's instructions about healthy diet, exercising, and getting tested or screened for diseases. Follow your health care provider's instructions on monitoring your cholesterol and blood pressure. This information is not intended to replace advice given to you by your health care provider. Make sure you discuss any questions you have with your health care provider. Document Revised: 07/27/2020 Document Reviewed: 07/27/2020 Elsevier Patient Education  [place].

## 2022-04-15 ENCOUNTER — Ambulatory Visit (INDEPENDENT_AMBULATORY_CARE_PROVIDER_SITE_OTHER): Payer: Medicaid Other | Admitting: Internal Medicine

## 2022-04-15 VITALS — BP 138/70 | HR 53 | Temp 98.6°F | Ht 62.0 in | Wt 197.0 lb

## 2022-04-15 DIAGNOSIS — M19071 Primary osteoarthritis, right ankle and foot: Secondary | ICD-10-CM | POA: Diagnosis not present

## 2022-04-15 DIAGNOSIS — Z0000 Encounter for general adult medical examination without abnormal findings: Secondary | ICD-10-CM

## 2022-04-15 DIAGNOSIS — E7849 Other hyperlipidemia: Secondary | ICD-10-CM

## 2022-04-15 DIAGNOSIS — R7303 Prediabetes: Secondary | ICD-10-CM

## 2022-04-15 DIAGNOSIS — I10 Essential (primary) hypertension: Secondary | ICD-10-CM

## 2022-04-15 DIAGNOSIS — M19072 Primary osteoarthritis, left ankle and foot: Secondary | ICD-10-CM

## 2022-04-15 DIAGNOSIS — K5903 Drug induced constipation: Secondary | ICD-10-CM

## 2022-04-15 DIAGNOSIS — K5900 Constipation, unspecified: Secondary | ICD-10-CM | POA: Insufficient documentation

## 2022-04-15 DIAGNOSIS — Z23 Encounter for immunization: Secondary | ICD-10-CM

## 2022-04-15 LAB — LIPID PANEL
Cholesterol: 147 mg/dL (ref 0–200)
HDL: 41.8 mg/dL (ref >39.00)
LDL Cholesterol: 79 mg/dL (ref 0–99)
NonHDL: 105.2
Total CHOL/HDL Ratio: 4
Triglycerides: 131 mg/dL (ref 0.0–149.0)
VLDL: 26.2 mg/dL (ref 0.0–40.0)

## 2022-04-15 LAB — CBC WITH DIFFERENTIAL/PLATELET
Basophils Absolute: 0.1 10*3/uL (ref 0.0–0.1)
Basophils Relative: 1 % (ref 0.0–3.0)
Eosinophils Absolute: 0.3 10*3/uL (ref 0.0–0.7)
Eosinophils Relative: 2.3 % (ref 0.0–5.0)
HCT: 37.4 % (ref 36.0–46.0)
Hemoglobin: 12.8 g/dL (ref 12.0–15.0)
Lymphocytes Relative: 32.6 % (ref 12.0–46.0)
Lymphs Abs: 3.9 10*3/uL (ref 0.7–4.0)
MCHC: 34.1 g/dL (ref 30.0–36.0)
MCV: 99.1 fl (ref 78.0–100.0)
Monocytes Absolute: 0.8 10*3/uL (ref 0.1–1.0)
Monocytes Relative: 6.3 % (ref 3.0–12.0)
Neutro Abs: 6.9 10*3/uL (ref 1.4–7.7)
Neutrophils Relative %: 57.8 % (ref 43.0–77.0)
Platelets: 166 10*3/uL (ref 150.0–400.0)
RBC: 3.77 Mil/uL — ABNORMAL LOW (ref 3.87–5.11)
RDW: 14.8 % (ref 11.5–15.5)
WBC: 11.9 10*3/uL — ABNORMAL HIGH (ref 4.0–10.5)

## 2022-04-15 LAB — TSH: TSH: 4.99 u[IU]/mL (ref 0.35–5.50)

## 2022-04-15 LAB — COMPREHENSIVE METABOLIC PANEL
ALT: 12 U/L (ref 0–35)
AST: 17 U/L (ref 0–37)
Albumin: 4.1 g/dL (ref 3.5–5.2)
Alkaline Phosphatase: 90 U/L (ref 39–117)
BUN: 19 mg/dL (ref 6–23)
CO2: 26 mEq/L (ref 19–32)
Calcium: 8.6 mg/dL (ref 8.4–10.5)
Chloride: 102 mEq/L (ref 96–112)
Creatinine, Ser: 1.5 mg/dL — ABNORMAL HIGH (ref 0.40–1.20)
GFR: 38.47 mL/min — ABNORMAL LOW (ref >60.00)
Glucose, Bld: 108 mg/dL — ABNORMAL HIGH (ref 70–99)
Potassium: 3.7 mEq/L (ref 3.5–5.1)
Sodium: 135 mEq/L (ref 135–145)
Total Bilirubin: 0.4 mg/dL (ref 0.2–1.2)
Total Protein: 7.9 g/dL (ref 6.0–8.3)

## 2022-04-15 LAB — HEMOGLOBIN A1C: Hgb A1c MFr Bld: 5.8 % (ref 4.6–6.5)

## 2022-04-15 NOTE — Assessment & Plan Note (Signed)
Chronic Check a1c Low sugar / carb diet Stressed regular exercise   

## 2022-04-15 NOTE — Assessment & Plan Note (Signed)
Related to norpace Taking colace 1 tab daily Start miralax daily

## 2022-04-15 NOTE — Assessment & Plan Note (Signed)
Chronic Follows with cardiology-has hypertrophic obstructive cardiomyopathy Blood pressure well controlled CMP Continue HCTZ 12.5 mg daily, nebivolol 10 mg daily, spironolactone 25 mg daily

## 2022-04-15 NOTE — Assessment & Plan Note (Signed)
Chronic Regular exercise and healthy diet encouraged Check lipid panel  Continue Crestor 10 mg daily 

## 2022-04-15 NOTE — Assessment & Plan Note (Signed)
Chronic Taking meloxicam 7.5 mg daily as needed-discussed need to limit this to a minimum

## 2022-04-18 NOTE — Addendum Note (Signed)
Addended by: Marcina Millard on: 04/18/2022 08:01 AM   Modules accepted: Orders

## 2022-05-02 ENCOUNTER — Telehealth: Payer: Self-pay | Admitting: Internal Medicine

## 2022-05-02 NOTE — Telephone Encounter (Signed)
Called pt in regards to HOCM symptoms.  Pt reports has what she calls "fainting spells"  however pt reports does not loose consciousness. Pt expresses feels light headed, dizzy and like will pass out.  Described an instance where was bending down to pick something up off the floor.  Felt like would pass out felt as if body was going to shut down.  Was able to brace self and not fall.  Pt reports when has spells it feels like everything is vibrating.  Pt reports has not started any new medications or been sick recently.  Is not able to take BP around time of spells.  Reports it takes about 10 min to recover after spells.  Reports BP are okay does not have a BP log.  Takes OTC dramamine to help with symptoms. Offered pt an OV with MD 05/03/22 at 9:20 am pt reports can not come.  Pt will come in on 05/04/21 at 8:30 am.  Advised pt if symptoms persist or worsen to call 911.  Pt expresses understanding. Will send to MD.

## 2022-05-02 NOTE — Telephone Encounter (Signed)
STAT if patient feels like he/she is going to faint   Are you dizzy now? No   Do you feel faint or have you passed out? No   Do you have any other symptoms? No   Have you checked your HR and BP (record if available)? No     Patient is calling stating her HOCM symptoms have recently worsened. She reports she has been having episodes everyday since last week anytime she is active or bends over. She is requesting a f/u with Chandrasekhar regarding this.

## 2022-05-04 ENCOUNTER — Encounter: Payer: Self-pay | Admitting: Internal Medicine

## 2022-05-04 ENCOUNTER — Ambulatory Visit: Payer: Medicaid Other | Attending: Internal Medicine | Admitting: Internal Medicine

## 2022-05-04 ENCOUNTER — Ambulatory Visit (INDEPENDENT_AMBULATORY_CARE_PROVIDER_SITE_OTHER): Payer: Medicaid Other

## 2022-05-04 VITALS — BP 130/70 | HR 50 | Ht 62.0 in | Wt 199.0 lb

## 2022-05-04 DIAGNOSIS — I422 Other hypertrophic cardiomyopathy: Secondary | ICD-10-CM | POA: Diagnosis present

## 2022-05-04 DIAGNOSIS — R55 Syncope and collapse: Secondary | ICD-10-CM | POA: Insufficient documentation

## 2022-05-04 DIAGNOSIS — I10 Essential (primary) hypertension: Secondary | ICD-10-CM | POA: Diagnosis present

## 2022-05-04 DIAGNOSIS — I421 Obstructive hypertrophic cardiomyopathy: Secondary | ICD-10-CM | POA: Diagnosis not present

## 2022-05-04 NOTE — Progress Notes (Signed)
Cardiology Office Note:    Date:  05/04/2022   ID:  DESMA MUNAFO, DOB 25-Nov-1965, MRN [id number]  PCP:  Binnie Rail, MD   [facility] Providers Cardiologist:  Werner Lean, MD     Referring MD: Binnie Rail, MD   CC: HCM follow up  History of Present Illness:    LENNYX WINSTANLEY is a 57 y.o. female with a hx of HOCM seen by Dr. Harl Bowie and Caryl Comes who presents for initiation of mavacamten.  Seen in follow up after changes led to [facility] discontinuation. 2023:  Post mavacamten start, patient had improved symptoms.    NYHA I on therapy. She noticed handfuls of hair falling out on mavacamten; this stopped after stopping the medication.  We stopped the medication for this reason but also because she lost her insurance.  She is pending [employer] assist and disability.  We started disopyramide with improved symptoms.  She was able to get insurance and disability and is seen today for discussions of therapy. 2024: She had TEE, LHC, and RHC.  She has been referred to [facility]. She called in with worsening symptoms in interval.   Patient notes worsening SOB at rest and worsening DOE Despite no change in therapy. Notes increased fatigue- she has tried to do her yard work and is having new trouble with ADLs. Notes pretty frequent palpitations at night. Notes no CP. Notes worsening dizziness. Notes two weeks of near syncope.  Notable family events include no events with her son.  Has been smoke free for one month.   Past Medical History:  Diagnosis Date   ADHD (attention deficit hyperactivity disorder)    Bipolar 1 disorder ([place])    History of alcohol abuse    History of drug abuse ([place])    Interstitial cystitis     Past Surgical History:  Procedure Laterality Date   ADENOIDECTOMY  1971   APPENDECTOMY     RIGHT/LEFT HEART CATH AND CORONARY ANGIOGRAPHY N/A 03/30/2022   Procedure: RIGHT/LEFT HEART CATH AND CORONARY ANGIOGRAPHY;  Surgeon: Troy Sine, MD;  Location:  [place] CV LAB;  Service: Cardiovascular;  Laterality: N/A;   SALPINGECTOMY  1993   TEE WITHOUT CARDIOVERSION N/A 03/30/2022   Procedure: TRANSESOPHAGEAL ECHOCARDIOGRAM (TEE);  Surgeon: Werner Lean, MD;  Location: [facility] ENDOSCOPY;  Service: Cardiovascular;  Laterality: N/A;   vaginal surgery      Current Medications: Current Meds  Medication Sig   amphetamine-dextroamphetamine (ADDERALL XR) 20 MG 24 hr capsule Take 20 mg by mouth every morning.   disopyramide (NORPACE) 100 MG capsule Take 1 capsule (100 mg total) by mouth 4 (four) times daily.   docusate sodium (COLACE) 100 MG capsule Take 100 mg by mouth at bedtime. Can take up to 3 tabs per day as needed   meclizine (DRAMAMINE) 25 MG tablet Take 25 mg by mouth 3 (three) times daily as needed for dizziness.   Melatonin 10 MG TABS Take 10 mg by mouth at bedtime.   meloxicam (MOBIC) 7.5 MG tablet TAKE 1 TABLET BY MOUTH TWICE  DAILY AS NEEDED FOR PAIN   nebivolol (BYSTOLIC) 10 MG tablet Take 1 tablet (10 mg total) by mouth daily.   OLANZapine-Samidorphan 5-10 MG TABS Take 1 tablet by mouth daily.   Olopatadine HCl (PAZEO) 0.7 % SOLN Place 1 drop into both eyes daily as needed (allergies).   polyethylene glycol (MIRALAX / GLYCOLAX) 17 g packet Take 17 g by mouth at bedtime.   Potassium 99  MG TABS Take 99 mg by mouth daily.   ROCKLATAN 0.02-0.005 % SOLN SMARTSIG:1 Drop(s) In Eye(s) Every Evening   rosuvastatin (CRESTOR) 10 MG tablet Take 1 tablet (10 mg total) by mouth daily.   solifenacin (VESICARE) 10 MG tablet Take 10 mg by mouth daily.   spironolactone (ALDACTONE) 25 MG tablet Take 1 tablet (25 mg total) by mouth daily.   [DISCONTINUED] hydrochlorothiazide (MICROZIDE) 12.5 MG capsule Take 1 capsule (12.5 mg total) by mouth daily.   Current Facility-Administered Medications for the 05/04/22 encounter (Office Visit) with Werner Lean, MD  Medication   sodium chloride flush (NS) 0.9 % injection 3 mL     Allergies:    Aspirin   Social History   Socioeconomic History   Marital status: Single    Spouse name: Not on file   Number of children: 1   Years of education: Not on file   Highest education level: Not on file  Occupational History    Employer: [employer]  Tobacco Use   Smoking status: Former    Packs/day: 0.50    Types: Cigarettes   Smokeless tobacco: Never  Substance and Sexual Activity   Alcohol use: No    Alcohol/week: 0.0 standard drinks of alcohol   Drug use: No    Comment: h/o abuse hasn't used since 2002   Sexual activity: Not Currently    Partners: Male    Birth control/protection: None  Other Topics Concern   Not on file  Social History Narrative   Exercise:  Walks a lot, tries to [place]    Social Determinants of Health   Financial Resource Strain: High Risk (11/15/2021)   Overall Financial Resource Strain (CARDIA)    Difficulty of Paying Living Expenses: Hard  Food Insecurity: No Food Insecurity (11/15/2021)   Hunger Vital Sign    Worried About Running Out of Food in the Last Year: Never true    Ran Out of Food in the Last Year: Never true  Transportation Needs: Not on file  Physical Activity: Not on file  Stress: Not on file  Social Connections: Not on file    Social: Formerly very active- worked at [employer], worked in Librarian, academic, 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  ROS:   Please see the history of present illness.     All other systems reviewed and are negative.  EKGs/Labs/Other Studies Reviewed:    The following  studies were reviewed today: 05/04/22: Sinus bradycardia QRS duration 88 with iRBBB, LVH with secondary repolarization  11/15/21: Sinus bradycardia one PAC  Cardiac Studies & Procedures   CARDIAC CATHETERIZATION  CARDIAC CATHETERIZATION 03/30/2022  Narrative   Ost LAD to Prox LAD lesion is 5% stenosed.  Essentially normal coronary arteries with a dominant left circumflex system.  There is minimal irregularity in the proximal LAD.  Upper normal right heart pressures with no evidence for pulmonary hypertension.  HOCM documented by echocardiography with significant asymmetric left ventricular hypertrophy and LVOT gradient of 27 mm.  Patient will follow-up with Dr. Myrtie Cruise.  Findings Coronary Findings Diagnostic  Dominance: Co-dominant  Left Anterior Descending Ost LAD to Prox LAD lesion is 5% stenosed.  Intervention  No interventions have been documented.   STRESS TESTS  ECHOCARDIOGRAM STRESS TEST 12/10/2021  Narrative EXERCISE STRESS ECHO REPORT   --------------------------------------------------------------------------------  Patient Name:   ZAAKIRAH DALOMBA  Date of Exam: 05/27/2021 Medical Rec #:  [id number]     Height:       62.0 in Accession #:    [id number]    Weight:       193.9 lb Date of Birth:  02-18-1966      BSA:          1.887 m Patient Age:    56 years      BP:           128/82 mmHg Patient Gender: F             HR:           54 bpm. Exam Location:  [address]  Procedure: Stress Echo, Color Doppler and Cardiac Doppler  Indications:    I42.2 HOCM  History:        Patient has prior history of Echocardiogram examinations, most recent 10/16/2020.  Sonographer:    Diamond Nickel RCS Referring Phys: [id number] [facility] A Jacqui Headen  IMPRESSIONS   1. This is an inconclusive stress echocardiogram for ischemia. Target heart rate not achieved and stress images of LVF not acquired. 2. The findings of the study demonstrate that a stress-induced outflow tract  obstruction is present (peak LVOT gradient 63.2 mmHG).  FINDINGS  Exam Protocol:   Patient Performance: The patient exercised for 3 minutes and 15 seconds, achieving 5 METS. The baseline heart rate was 54 bpm. The heart rate at peak stress was 86 bpm. The target heart rate was calculated to be 139 bpm. The percentage of maximum predicted heart rate achieved was 52.6 %. The baseline blood pressure was 128/82 mmHg. The blood pressure at peak stress was 151/80 mmHg. The blood pressure response was normal. The patient developed shortness of breath and fatigue during the stress exam. The patient's functional capacity was below average.  EKG: Resting EKG showed sinus bradycardia with no abnormal findings. The patient developed no abnormal EKG findings during exercise. There was 0.5 mm of horizontal ST segment depression in lead(s) II, III and AVF that resolved in less than 1 minute(s) into recovery.   2D Echo Findings: The baseline ejection fraction was 70%. Baseline regional wall motion abnormalities were not present. This is an inconclusive stress echocardiogram for ischemia.  Stress Doppler:  LVOT: The baseline LVOT gradient was 44 mmHG. The peak LVOT gradient at stress was 63.2 mmHG. The findings of the study demonstrate that a stress-induced outflow tract obstruction is present.  Mitral Regurgitation: At baseline, mild mitral regurgitation was present.   Fransico Him MD Electronically signed on 05/28/2021 at 2:40:18 PM     Final   ECHOCARDIOGRAM  ECHOCARDIOGRAM COMPLETE 07/15/2021  Narrative ECHOCARDIOGRAM REPORT    Patient Name:   TAQUESHA REDDITT Date of Exam: 07/15/2021 Medical Rec #:  [id number]      Height:       62.0 in Accession #:    [id number]     Weight:       193.9 lb Date of Birth:  10/11/1965       BSA:          1.887 m Patient Age:    39 years       BP:  128/80 mmHg Patient Gender: F              HR:           50 bpm. Exam Location:  [address]  Procedure: 2D Echo, 3D Echo, Cardiac Doppler, Color Doppler and Strain Analysis  Indications:    I42.1 HOCM.  History:        Patient has prior history of Echocardiogram examinations, most recent 05/27/2021. Hypertrophic Cardiomyopathy, Arrythmias:Bradycardia, Signs/Symptoms:Syncope and Murmur; Risk Factors:Hypertension, Dyslipidemia and Current Smoker. H/o EtOH. H/o drug abuse. Pre-diabetes.  Sonographer:    Basilia Jumbo [facility], RDCS Referring Phys: [id number] [facility] A Gasper Sells   Sonographer Comments: Camzyo start, day 1 06/19/2021. Patient did not have meal before exam. IMPRESSIONS   1. Left ventricular ejection fraction, by estimation, is 65 to 70%. The left ventricle has hyperdynamic function. The left ventricle has no regional wall motion abnormalities. Severe asymmetric basal septal left ventricular hypertrophy. There is mild systolic anterior motion of the mitral valve. Peak LV outflow tract gradient was 35 mmHg with Valsalva. Left ventricular diastolic parameters are consistent with Grade I diastolic dysfunction (impaired relaxation). The average left ventricular global longitudinal strain is -20.5 %. The global longitudinal strain is normal. 2. Right ventricular systolic function is normal. The right ventricular size is normal. Tricuspid regurgitation signal is inadequate for assessing PA pressure. 3. Left atrial size was mildly dilated. 4. The mitral valve is abnormal with mild systolic anterior motion. Mild mitral valve regurgitation. No evidence of mitral stenosis. 5. The aortic valve is tricuspid. There is mild calcification of the aortic valve. Aortic valve regurgitation is not visualized. No aortic stenosis is present. 6. The inferior vena cava is normal in size with greater than 50% respiratory variability, suggesting right atrial pressure of 3 mmHg.  Comparison(s): Stress echo 05/27/21 EF 70%. LVOT peak gradiant 11mHg.  FINDINGS Left Ventricle: Left ventricular  ejection fraction, by estimation, is 65 to 70%. The left ventricle has hyperdynamic function. The left ventricle has no regional wall motion abnormalities. The average left ventricular global longitudinal strain is -20.5 %. The global longitudinal strain is normal. The left ventricular internal cavity size was normal in size. Severe asymmetric basal septal left ventricular hypertrophy. Left ventricular diastolic parameters are consistent with Grade I diastolic dysfunction (impaired relaxation).  Right Ventricle: The right ventricular size is normal. No increase in right ventricular wall thickness. Right ventricular systolic function is normal. Tricuspid regurgitation signal is inadequate for assessing PA pressure.  Left Atrium: Left atrial size was mildly dilated.  Right Atrium: Right atrial size was normal in size.  Pericardium: There is no evidence of pericardial effusion.  Mitral Valve: The mitral valve is abnormal. Mild mitral annular calcification. Mild mitral valve regurgitation. No evidence of mitral valve stenosis.  Tricuspid Valve: The tricuspid valve is normal in structure. Tricuspid valve regurgitation is not demonstrated.  Aortic Valve: The aortic valve is tricuspid. There is mild calcification of the aortic valve. Aortic valve regurgitation is not visualized. No aortic stenosis is present.  Pulmonic Valve: The pulmonic valve was normal in structure. Pulmonic valve regurgitation is not visualized.  Aorta: The aortic root is normal in size and structure.  Venous: The inferior vena cava is normal in size with greater than 50% respiratory variability, suggesting right atrial pressure of 3 mmHg.  IAS/Shunts: No atrial level shunt detected by color flow Doppler.   LEFT VENTRICLE PLAX 2D LVIDd:         4.10 cm   Diastology LVIDs:  2.70 cm   LV e' medial:    6.73 cm/s LV PW:         1.10 cm   LV E/e' medial:  14.0 LV IVS:        1.10 cm   LV e' lateral:   6.89 cm/s LVOT  diam:     2.00 cm   LV E/e' lateral: 13.7 LV SV:         118 LV SV Index:   63        2D Longitudinal Strain LVOT Area:     3.14 cm  2D Strain GLS (A2C):   -20.5 % 2D Strain GLS (A3C):   -18.4 % 2D Strain GLS (A4C):   -22.5 % 2D Strain GLS Avg:     -20.5 %  3D Volume EF: 3D EF:        58 % LV EDV:       89 ml LV ESV:       37 ml LV SV:        52 ml  RIGHT VENTRICLE             IVC RV Basal diam:  2.40 cm     IVC diam: 1.50 cm RV S prime:     12.10 cm/s TAPSE (M-mode): 1.6 cm  LEFT ATRIUM             Index        RIGHT ATRIUM           Index LA diam:        4.50 cm 2.39 cm/m   RA Pressure: 3.00 mmHg LA Vol (A2C):   26.5 ml 14.05 ml/m  RA Area:     10.30 cm LA Vol (A4C):   43.3 ml 22.95 ml/m  RA Volume:   16.90 ml  8.96 ml/m LA Biplane Vol: 35.3 ml 18.71 ml/m AORTIC VALVE LVOT Vmax:   139.00 cm/s LVOT Vmean:  94.600 cm/s LVOT VTI:    0.377 m  AORTA Ao Root diam: 3.00 cm Ao Asc diam:  3.40 cm  MITRAL VALVE                TRICUSPID VALVE Estimated RAP:  3.00 mmHg MV Decel Time: 348 msec MV E velocity: 94.30 cm/s   SHUNTS MV A velocity: 120.00 cm/s  Systemic VTI:  0.38 m MV E/A ratio:  0.79         Systemic Diam: 2.00 cm  Dalton McleanMD Electronically signed by Franki Monte Signature Date/Time: 07/15/2021/4:23:56 PM    Final   TEE  ECHO TEE 03/30/2022  Narrative TRANSESOPHOGEAL ECHO REPORT    Patient Name:   KETURA SALATA Date of Exam: 03/30/2022 Medical Rec #:  [id number]      Height:       62.0 in Accession #:    [id number]     Weight:       194.0 lb Date of Birth:  Feb 17, 1966       BSA:          1.887 m Patient Age:    56 years       BP:           148/69 mmHg Patient Gender: F              HR:           47 bpm. Exam Location:  Outpatient  Procedure: Transesophageal Echo, 3D Echo, Color Doppler and Cardiac Doppler  Indications:  hypertrophic obstructive cardiomyopathy  History:         Patient has prior history of Echocardiogram  examinations, most recent 07/15/2021. Arrythmias:Bradycardia; Risk Factors:Hypertension, Dyslipidemia and Current Smoker.  Sonographer:     Johny Chess RDCS Referring Phys:  [id number] [facility] A Gasper Sells Diagnosing Phys: Rudean Haskell MD  PROCEDURE: After discussion of the risks and benefits of a TEE, an informed consent was obtained from the patient. The transesophogeal probe was passed without difficulty through the esophogus of the patient. Imaged were obtained with the patient in a left lateral decubitus position. Sedation performed by different physician. The patient was monitored while under deep sedation. Anesthestetic sedation was provided intravenously by Anesthesiology: 283m of Propofol, 225mof Lidocaine. The patient developed no complications during the procedure.  IMPRESSIONS   1. LVOT Gradient of 27 mm Hg. Left ventricular ejection fraction, by estimation, is 65 to 70%. The left ventricle has normal function. There is severe asymmetric left ventricular hypertrophy of the septal segment. 2. Right ventricular systolic function is normal. The right ventricular size is normal. 3. Left atrial size was mildly dilated. No left atrial/left atrial appendage thrombus was detected. The LAA emptying velocity was 29 cm/s. 4. Right atrial size was mildly dilated. 5. Bileaflet prolapse with SAM. The mitral valve is abnormal. Mild to moderate mitral valve regurgitation. 6. The aortic valve is tricuspid. Aortic valve regurgitation is not visualized.  FINDINGS Left Ventricle: LVOT Gradient of 27 mm Hg. Left ventricular ejection fraction, by estimation, is 65 to 70%. The left ventricle has normal function. The left ventricular internal cavity size was normal in size. There is severe asymmetric left ventricular hypertrophy of the septal segment.  Right Ventricle: The right ventricular size is normal. No increase in right ventricular wall thickness. Right ventricular systolic  function is normal.  Left Atrium: Left atrial size was mildly dilated. No left atrial/left atrial appendage thrombus was detected. The LAA emptying velocity was 29 cm/s.  Right Atrium: Right atrial size was mildly dilated.  Pericardium: There is no evidence of pericardial effusion.  Mitral Valve: Bileaflet prolapse with SAM. The mitral valve is abnormal. Mild to moderate mitral valve regurgitation.  Tricuspid Valve: The tricuspid valve is normal in structure. Tricuspid valve regurgitation is not demonstrated. No evidence of tricuspid stenosis.  Aortic Valve: The aortic valve is tricuspid. Aortic valve regurgitation is not visualized.  Pulmonic Valve: The pulmonic valve was normal in structure. Pulmonic valve regurgitation is not visualized.  Aorta: The aortic root and ascending aorta are structurally normal, with no evidence of dilitation.  IAS/Shunts: No atrial level shunt detected by color flow Doppler.  MaRudean HaskellD Electronically signed by MaRudean HaskellD Signature Date/Time: 03/30/2022/6:13:27 PM    Final   MONITORS  LONG TERM MONITOR (3-14 DAYS) 02/09/2021  Narrative  Abnormal study  Agree with findings Predominant sinus rhythm. Episodes of a junctional rhythm. Symptomatic bradycardia with heart rates in the 40s in the afternoon. Ectopic atrial rhythm.  4 beats of NSVT. No sustained VT.   Patch Wear Time:  7 days and 1 hours (2022-11-08T18:38:17-0500 to 2022-11-15T19:58:02-499)  Patient had a min HR of 37 bpm, max HR of 162 bpm, and avg HR of 51 bpm. Predominant underlying rhythm was Sinus Rhythm. Slight P wave morphology changes were noted. 1 run of Ventricular Tachycardia occurred lasting 4 beats with a max rate of 162 bpm (avg 155 bpm). Ectopic Atrial Rhythm was present. Junctional Rhythm was present. Ventricular Tachycardia, Ectopic Atrial Rhythm and Junctional Rhythm were detected within +/-  45 seconds of symptomatic patient event(s). Isolated  SVEs were occasional (1.1%, 5867), SVE Couplets were rare (<1.0%, 148), and SVE Triplets were rare (<1.0%, 2). Isolated VEs were rare (<1.0%), and no VE Couplets or VE Triplets were present.             Recent Labs: 04/15/2022: ALT 12; BUN 19; Creatinine, Ser 1.50; Hemoglobin 12.8; Platelets 166.0; Potassium 3.7; Sodium 135; TSH 4.99  Recent Lipid Panel    Component Value Date/Time   CHOL 147 04/15/2022 1120   TRIG 131.0 04/15/2022 1120   HDL 41.80 04/15/2022 1120   CHOLHDL 4 04/15/2022 1120   VLDL 26.2 04/15/2022 1120   LDLCALC 79 04/15/2022 1120        Physical Exam:    VS:  BP 130/70   Pulse (!) 50   Ht 5' 2"$  (1.575 m)   Wt 199 lb (90.3 kg)   SpO2 96%   BMI 36.40 kg/m     Wt Readings from Last 3 Encounters:  05/04/22 199 lb (90.3 kg)  04/15/22 197 lb (89.4 kg)  03/30/22 195 lb (88.5 kg)    Gen: Mild distress    Neck: No JVD Cardiac: No Rubs or Gallops, harsh systolic murmur at rest, RRR +2 radial pulses Respiratory: Clear to auscultation bilaterally, normal effort, normal  respiratory rate GI: Soft, nontender, non-distended  MS: No edema;  moves all extremities Integument: Skin feels well Neuro:  At time of evaluation, alert and oriented to person/place/time/situation  Psych: Normal affect, patient feels OK  ASSESSMENT:    1. HOCM (hypertrophic obstructive cardiomyopathy) ([place])   2. Hypertrophic cardiomyopathy ([place])   3. Near syncope   4. Hypertension, unspecified type     PLAN:    Hypertrophic Cardiomyopathy - NYHA III-IV - severe LVOT gradient (63 on stress echo) on current therapy - she had a CMR for 06/09/2022 - Will get 3 day non live ziopatch - We will stop her HCTZ - will get f/u labs and may need to stop her K - if sx persist will increase norpace to 600 mg total dose (already has some dry mouth and urinary retention) - if sx persist despite this will change nebivolol to succinate - ultimately our goal is the improve her symptoms as a bridge  to SRT at [facility]; we had discussed with Dr. Mina Marble at [facility]; this would be preferable to Imbria/Aficamten trial given her limitations getting to [facility] - had hair loss on mavacamten that resolved with cessation (this improved her sx dramatically) - will resend [facility] Referral; patient encouraged to let us know if she has not received the referral  - We have discussed genetic testing for her son; he now has insurance and needs an echocardiogram  - she has found a lot of benefit with the [facility] information we sent and joined an Facebook support group   HTN Vertigo - she is followed by Dr. Beckie Busing and improved on dramamine as her Dr. Harl Bowie   Time Spent Directly with Patient:   I have spent a total of 40 minutes with the patient reviewing notes, imaging, EKGs, labs and examining the patient as well as establishing an assessment and plan that was discussed personally with the patient.  > 50% of time was spent in direct patient care.   Three months with me unless following at [facility] (post procedure)           Medication Adjustments/Labs and Tests Ordered: Current medicines are reviewed at length with the patient today.  Concerns regarding medicines are outlined above.  Orders Placed This Encounter  Procedures   Basic metabolic panel   Ambulatory referral to Cardiology   Ambulatory referral to [place] (3-14 DAYS)   EKG 12-Lead   No orders of the defined types were placed in this encounter.   Patient Instructions  Medication Instructions:  Your physician has recommended you make the following change in your medication:  STOP: HCTZ (hydrochlorothiazide)   *If you need a refill on your cardiac medications before your next appointment, please call your pharmacy*   Lab Work: IN 1 WEEK: BMP  If you have labs (blood work) drawn today and your tests are completely normal, you will receive your results only by: [place] (if you have MyChart) OR A paper copy in  the mail If you have any lab test that is abnormal or we need to change your treatment, we will call you to review the results.   Testing/Procedures: Your physician has referred you to see a Dietitian for Hypertrophic Obstructive Cardiomyopathy.  Your physician has referred you to [facility] for Hypertrophic Obstructive Cardiomyopathy.   Follow-Up: At [facility], you and your health needs are our priority.  As part of our continuing mission to provide you with exceptional heart care, we have created designated Provider Care Teams.  These Care Teams include your primary Cardiologist (physician) and Advanced Practice Providers (APPs -  Physician Assistants and Nurse Practitioners) who all work together to provide you with the care you need, when you need it.  We recommend signing up for the patient portal called "MyChart".  Sign up information is provided on this After Visit Summary.  MyChart is used to connect with patients for Virtual Visits (Telemedicine).  Patients are able to view lab/test results, encounter notes, upcoming appointments, etc.  Non-urgent messages can be sent to your provider as well.   To learn more about what you can do with MyChart, go to [email or web address].    Your next appointment:   3 month(s)  Provider:   Werner Lean, MD     Other Instructions Bryn Gulling- Long Term Monitor Instructions  Your physician has requested you wear a ZIO patch monitor for 3 days.  This is a single patch monitor. [employer] supplies one patch monitor per enrollment. Additional stickers are not available. Please do not apply patch if you will be having a Nuclear Stress Test,   Cardiac CT, MRI, or Chest Xray during the period you would be wearing the  monitor. The patch cannot be worn during these tests. You cannot remove and re-apply the  ZIO XT patch monitor.  Your ZIO patch monitor will be mailed 3 day USPS to your address on file. It may take 3-5 days   to receive your monitor after you have been enrolled.  Once you have received your monitor, please review the enclosed instructions. Your monitor  has already been registered assigning a specific monitor serial # to you.  Billing and Patient Assistance Program Information  We have supplied [employer] with any of your insurance information on file for billing purposes. [employer] offers a sliding scale Patient Assistance Program for patients that do not have  insurance, or whose insurance does not completely cover the cost of the ZIO monitor.  You must apply for the Patient Assistance Program to qualify for this discounted rate.  To apply, please call [employer] at [phone], select option 4, select option 2, ask to apply for  Patient Assistance Program. Theodore Demark will ask your household income, and how many people  are in your household. They will quote your out-of-pocket cost based on that information.  [employer] will also be able to set up a 7-month interest-free payment plan if needed.  Applying the monitor   Shave hair from upper left chest.  Hold abrader disc by orange tab. Rub abrader in 40 strokes over the upper left chest as  indicated in your monitor instructions.  Clean area with 4 enclosed alcohol pads. Let dry.  Apply patch as indicated in monitor instructions. Patch will be placed under collarbone on left  side of chest with arrow pointing upward.  Rub patch adhesive wings for 2 minutes. Remove white label marked "1". Remove the white  label marked "2". Rub patch adhesive wings for 2 additional minutes.  While looking in a mirror, press and release button in center of patch. A small green light will  flash 3-4 times. This will be your only indicator that the monitor has been turned on.  Do not shower for the first 24 hours. You may shower after the first 24 hours.  Press the button if you feel a symptom. You will hear a small click. Record Date, Time and  Symptom in the Patient  Logbook.  When you are ready to remove the patch, follow instructions on the last 2 pages of Patient  Logbook. Stick patch monitor onto the last page of Patient Logbook.  Place Patient Logbook in the blue and white box. Use locking tab on box and tape box closed  securely. The blue and white box has prepaid postage on it. Please place it in the mailbox as  soon as possible. Your physician should have your test results approximately 7 days after the  [facility]  Call [phone] you have questions regarding  your ZIO XT patch monitor. Call them immediately if you see an orange light blinking on your  monitor.  If your monitor falls off in less than 4 days, contact our Monitor department at [phone]  If your monitor becomes loose or falls off after 4 days call [employer] at [phone]  suggestions on securing your monitor     Signed, MWerner Lean MD  05/04/2022 9:55 AM    [place]

## 2022-05-04 NOTE — Patient Instructions (Signed)
Medication Instructions:  Your physician has recommended you make the following change in your medication:  STOP: HCTZ (hydrochlorothiazide)   *If you need a refill on your cardiac medications before your next appointment, please call your pharmacy*   Lab Work: IN 1 WEEK: BMP  If you have labs (blood work) drawn today and your tests are completely normal, you will receive your results only by: [place] (if you have MyChart) OR A paper copy in the mail If you have any lab test that is abnormal or we need to change your treatment, we will call you to review the results.   Testing/Procedures: Your physician has referred you to see a Dietitian for Hypertrophic Obstructive Cardiomyopathy.  Your physician has referred you to [facility] for Hypertrophic Obstructive Cardiomyopathy.   Follow-Up: At [facility], you and your health needs are our priority.  As part of our continuing mission to provide you with exceptional heart care, we have created designated Provider Care Teams.  These Care Teams include your primary Cardiologist (physician) and Advanced Practice Providers (APPs -  Physician Assistants and Nurse Practitioners) who all work together to provide you with the care you need, when you need it.  We recommend signing up for the patient portal called "MyChart".  Sign up information is provided on this After Visit Summary.  MyChart is used to connect with patients for Virtual Visits (Telemedicine).  Patients are able to view lab/test results, encounter notes, upcoming appointments, etc.  Non-urgent messages can be sent to your provider as well.   To learn more about what you can do with MyChart, go to [email or web address].    Your next appointment:   3 month(s)  Provider:   Werner Lean, MD     Other Instructions Bryn Gulling- Long Term Monitor Instructions  Your physician has requested you wear a ZIO patch monitor for 3 days.  This is a single  patch monitor. [employer] supplies one patch monitor per enrollment. Additional stickers are not available. Please do not apply patch if you will be having a Nuclear Stress Test,   Cardiac CT, MRI, or Chest Xray during the period you would be wearing the  monitor. The patch cannot be worn during these tests. You cannot remove and re-apply the  ZIO XT patch monitor.  Your ZIO patch monitor will be mailed 3 day USPS to your address on file. It may take 3-5 days  to receive your monitor after you have been enrolled.  Once you have received your monitor, please review the enclosed instructions. Your monitor  has already been registered assigning a specific monitor serial # to you.  Billing and Patient Assistance Program Information  We have supplied [employer] with any of your insurance information on file for billing purposes. [employer] offers a sliding scale Patient Assistance Program for patients that do not have  insurance, or whose insurance does not completely cover the cost of the ZIO monitor.  You must apply for the Patient Assistance Program to qualify for this discounted rate.  To apply, please call [employer] at [phone], select option 4, select option 2, ask to apply for  Patient Assistance Program. Theodore Demark will ask your household income, and how many people  are in your household. They will quote your out-of-pocket cost based on that information.  [employer] will also be able to set up a 19-month interest-free payment plan if needed.  Applying the monitor   Shave hair from upper left chest.  Hold abrader disc  by orange tab. Rub abrader in 40 strokes over the upper left chest as  indicated in your monitor instructions.  Clean area with 4 enclosed alcohol pads. Let dry.  Apply patch as indicated in monitor instructions. Patch will be placed under collarbone on left  side of chest with arrow pointing upward.  Rub patch adhesive wings for 2 minutes. Remove white label marked "1". Remove the  white  label marked "2". Rub patch adhesive wings for 2 additional minutes.  While looking in a mirror, press and release button in center of patch. A small green light will  flash 3-4 times. This will be your only indicator that the monitor has been turned on.  Do not shower for the first 24 hours. You may shower after the first 24 hours.  Press the button if you feel a symptom. You will hear a small click. Record Date, Time and  Symptom in the Patient Logbook.  When you are ready to remove the patch, follow instructions on the last 2 pages of Patient  Logbook. Stick patch monitor onto the last page of Patient Logbook.  Place Patient Logbook in the blue and white box. Use locking tab on box and tape box closed  securely. The blue and white box has prepaid postage on it. Please place it in the mailbox as  soon as possible. Your physician should have your test results approximately 7 days after the  monitor has been mailed back to [facility].  Call [place] at [phone] if you have questions regarding  your ZIO XT patch monitor. Call them immediately if you see an orange light blinking on your  monitor.  If your monitor falls off in less than 4 days, contact our Monitor department at [phone].  If your monitor becomes loose or falls off after 4 days call [employer] at [phone] for  suggestions on securing your monitor

## 2022-05-04 NOTE — Progress Notes (Unsigned)
Enrolled for [employer] to mail a ZIO XT long term holter monitor to the patients address on file.  

## 2022-05-07 DIAGNOSIS — R55 Syncope and collapse: Secondary | ICD-10-CM | POA: Diagnosis not present

## 2022-05-07 DIAGNOSIS — I421 Obstructive hypertrophic cardiomyopathy: Secondary | ICD-10-CM | POA: Diagnosis not present

## 2022-05-10 ENCOUNTER — Encounter: Payer: Self-pay | Admitting: Internal Medicine

## 2022-05-13 ENCOUNTER — Ambulatory Visit: Payer: Medicaid Other | Attending: Internal Medicine

## 2022-05-13 DIAGNOSIS — I421 Obstructive hypertrophic cardiomyopathy: Secondary | ICD-10-CM

## 2022-05-13 DIAGNOSIS — R55 Syncope and collapse: Secondary | ICD-10-CM

## 2022-05-13 DIAGNOSIS — I10 Essential (primary) hypertension: Secondary | ICD-10-CM

## 2022-05-14 LAB — BASIC METABOLIC PANEL
BUN/Creatinine Ratio: 11 (ref 9–23)
BUN: 17 mg/dL (ref 6–24)
CO2: 22 mmol/L (ref 20–29)
Calcium: 9.2 mg/dL (ref 8.7–10.2)
Chloride: 105 mmol/L (ref 96–106)
Creatinine, Ser: 1.54 mg/dL — ABNORMAL HIGH (ref 0.57–1.00)
Glucose: 67 mg/dL — ABNORMAL LOW (ref 70–99)
Potassium: 4.8 mmol/L (ref 3.5–5.2)
Sodium: 140 mmol/L (ref 134–144)
eGFR: 39 mL/min/{1.73_m2} — ABNORMAL LOW (ref >59)

## 2022-05-16 DIAGNOSIS — F419 Anxiety disorder, unspecified: Secondary | ICD-10-CM | POA: Diagnosis not present

## 2022-05-16 DIAGNOSIS — F3160 Bipolar disorder, current episode mixed, unspecified: Secondary | ICD-10-CM | POA: Diagnosis not present

## 2022-05-16 DIAGNOSIS — F900 Attention-deficit hyperactivity disorder, predominantly inattentive type: Secondary | ICD-10-CM | POA: Diagnosis not present

## 2022-05-20 ENCOUNTER — Other Ambulatory Visit (HOSPITAL_COMMUNITY)
Admission: RE | Admit: 2022-05-20 | Discharge: 2022-05-20 | Disposition: A | Discharge status: Home / Self Care | Payer: Medicaid Other | Source: Ambulatory Visit | Attending: Obstetrics | Admitting: Obstetrics

## 2022-05-20 ENCOUNTER — Encounter: Payer: Self-pay | Admitting: Obstetrics

## 2022-05-20 ENCOUNTER — Telehealth: Payer: Self-pay | Admitting: Internal Medicine

## 2022-05-20 ENCOUNTER — Ambulatory Visit (INDEPENDENT_AMBULATORY_CARE_PROVIDER_SITE_OTHER): Payer: Medicaid Other | Admitting: Obstetrics

## 2022-05-20 VITALS — BP 144/78 | HR 44 | Ht 62.0 in | Wt 203.3 lb

## 2022-05-20 DIAGNOSIS — F17210 Nicotine dependence, cigarettes, uncomplicated: Secondary | ICD-10-CM

## 2022-05-20 DIAGNOSIS — Z01419 Encounter for gynecological examination (general) (routine) without abnormal findings: Secondary | ICD-10-CM | POA: Insufficient documentation

## 2022-05-20 DIAGNOSIS — Z780 Asymptomatic menopausal state: Secondary | ICD-10-CM | POA: Diagnosis not present

## 2022-05-20 DIAGNOSIS — E669 Obesity, unspecified: Secondary | ICD-10-CM

## 2022-05-20 DIAGNOSIS — Z1339 Encounter for screening examination for other mental health and behavioral disorders: Secondary | ICD-10-CM

## 2022-05-20 NOTE — Telephone Encounter (Signed)
[employer] is calling about abnormal results

## 2022-05-20 NOTE — Progress Notes (Signed)
Subjective:        Kylie Wright is a 57 y.o. female here for a routine exam.  Current complaints: None.    Personal health questionnaire:  Is patient [name], have a family history of breast and/or ovarian cancer: no Is there a family history of uterine cancer diagnosed at age < 61, gastrointestinal cancer, urinary tract cancer, family member who is a Field seismologist syndrome-associated carrier: no Is the patient overweight and hypertensive, family history of diabetes, personal history of gestational diabetes, preeclampsia or PCOS: no Is patient over 43, have PCOS,  family history of premature CHD under age 36, diabetes, smoke, have hypertension or peripheral artery disease:  no At any time, has a partner hit, kicked or otherwise hurt or frightened you?: no Over the past 2 weeks, have you felt down, depressed or hopeless?: no Over the past 2 weeks, have you felt little interest or pleasure in doing things?:no   Gynecologic History No LMP recorded. (Menstrual status: Perimenopausal). Contraception: none Last Pap: 2022. Results were: normal Last mammogram: 06-14-2021. Results were: normal  Obstetric History OB History  Gravida Para Term Preterm AB Living  '2 1 1   1 1  '$ SAB IAB Ectopic Multiple Live Births      1   1    # Outcome Date GA Lbr Len/2nd Weight Sex Delivery Anes PTL Lv  2 Term 2003 [redacted]w[redacted]d  M Vag-Spont EPI N LIV  1 Ectopic 1993            Past Medical History:  Diagnosis Date   ADHD (attention deficit hyperactivity disorder)    Bipolar 1 disorder ([place]    History of alcohol abuse    History of drug abuse ([place]    Interstitial cystitis    Other hypertrophic cardiomyopathy ([place]     Past Surgical History:  Procedure Laterality Date   ADENOIDECTOMY  1971   APPENDECTOMY     RIGHT/LEFT HEART CATH AND CORONARY ANGIOGRAPHY N/A 03/30/2022   Procedure: RIGHT/LEFT HEART CATH AND CORONARY ANGIOGRAPHY;  Surgeon: KTroy Sine MD;  [facility];   Service: Cardiovascular;  Laterality: N/A;   SALPINGECTOMY  1993   TEE WITHOUT CARDIOVERSION N/A 03/30/2022   Procedure: TRANSESOPHAGEAL ECHOCARDIOGRAM (TEE);  Surgeon: CWerner Lean MD;  [facility];  Service: Cardiovascular;  Laterality: N/A;   vaginal surgery       Current Outpatient Medications:    amphetamine-dextroamphetamine (ADDERALL XR) 20 MG 24 hr capsule, Take 20 mg by mouth every morning., Disp: , Rfl: 0   disopyramide (NORPACE) 100 MG capsule, Take 1 capsule (100 mg total) by mouth 4 (four) times daily., Disp: 360 capsule, Rfl: 3   docusate sodium (COLACE) 100 MG capsule, Take 100 mg by mouth at bedtime. Can take up to 3 tabs per day as needed, Disp: , Rfl:    meclizine (DRAMAMINE) 25 MG tablet, Take 25 mg by mouth 3 (three) times daily as needed for dizziness., Disp: , Rfl:    Melatonin 10 MG TABS, Take 10 mg by mouth at bedtime., Disp: , Rfl:    meloxicam (MOBIC) 7.5 MG tablet, TAKE 1 TABLET BY MOUTH TWICE  DAILY AS NEEDED FOR PAIN, Disp: 180 tablet, Rfl: 3   nebivolol (BYSTOLIC) 10 MG tablet, Take 1 tablet (10 mg total) by mouth daily., Disp: 90 tablet, Rfl: 1   OLANZapine-Samidorphan 5-10 MG TABS, Take 1 tablet by mouth daily., Disp: , Rfl:    Olopatadine HCl (PAZEO) 0.7 %  SOLN, Place 1 drop into both eyes daily as needed (allergies)., Disp: , Rfl:    polyethylene glycol (MIRALAX / GLYCOLAX) 17 g packet, Take 17 g by mouth at bedtime., Disp: , Rfl:    Potassium 99 MG TABS, Take 99 mg by mouth daily., Disp: , Rfl:    ROCKLATAN 0.02-0.005 % SOLN, SMARTSIG:1 Drop(s) In Eye(s) Every Evening, Disp: , Rfl:    rosuvastatin (CRESTOR) 10 MG tablet, Take 1 tablet (10 mg total) by mouth daily., Disp: 90 tablet, Rfl: 2   solifenacin (VESICARE) 10 MG tablet, Take 10 mg by mouth daily., Disp: , Rfl: 3   spironolactone (ALDACTONE) 25 MG tablet, Take 1 tablet (25 mg total) by mouth daily., Disp: 90 tablet, Rfl: 3  Current Facility-Administered Medications:    sodium  chloride flush (NS) 0.9 % injection 3 mL, 3 mL, Intravenous, Q12H, Chandrasekhar, Mahesh A, MD Allergies  Allergen Reactions   Aspirin Other (See Comments)    Patient reports Dr Matilde Sprang has advised her not to take aspirin because of interstitial cystitis.     Social History   Tobacco Use   Smoking status: Former    Packs/day: 0.50    Types: Cigarettes    Quit date: 03/31/2022    Years since quitting: 0.1   Smokeless tobacco: Never  Substance Use Topics   Alcohol use: No    Alcohol/week: 0.0 standard drinks of alcohol    Family History  Problem Relation Age of Onset   Arthritis Mother    Hyperlipidemia Mother    Heart disease Mother    Stroke Mother    Hypertension Mother    Diabetes Mother    Mental illness Mother    Alcohol abuse Father    Hypertension Other    Diabetes Other    Mental illness Other    Cancer Other    Cancer Sister 76       small intestine    Alcoholism Other       Review of Systems  Constitutional: negative for fatigue and weight loss Respiratory: negative for cough and wheezing Cardiovascular: negative for chest pain, fatigue and palpitations Gastrointestinal: negative for abdominal pain and change in bowel habits Musculoskeletal:negative for myalgias Neurological: negative for gait problems and tremors Behavioral/Psych: negative for abusive relationship, depression Endocrine: negative for temperature intolerance    Genitourinary:negative for abnormal menstrual periods, genital lesions, hot flashes, sexual problems and vaginal discharge Integument/breast: negative for breast lump, breast tenderness, nipple discharge and skin lesion(s)    Objective:       BP (!) 144/78 Comment: hx of hypertropic cardiomyopathy  Pulse (!) 44 Comment: Hx of hypertorphic cardiomyopathy  Ht '5\' 2"'$  (1.575 m)   Wt 203 lb 4.8 oz (92.2 kg)   BMI 37.18 kg/m  General:   alert  Skin:   no rash or abnormalities  Lungs:   clear to auscultation bilaterally   Heart:   regular rate and rhythm, S1, S2 normal, no murmur, click, rub or gallop  Breasts:   normal without suspicious masses, skin or nipple changes or axillary nodes  Abdomen:  normal findings: no organomegaly, soft, non-tender and no hernia  Pelvis:  External genitalia: normal general appearance Urinary system: urethral meatus normal and bladder without fullness, nontender Vaginal: normal without tenderness, induration or masses Cervix: normal appearance Adnexa: normal bimanual exam Uterus: anteverted and non-tender, normal size   Lab Review Urine pregnancy test Labs reviewed yes Radiologic studies reviewed yes  I have spent a total of 20 minutes of  face-to-face time, excluding clinical staff time, reviewing notes and preparing to see patient, ordering tests and/or medications, and counseling the patient.   Assessment:    1. Encounter for routine gynecological examination with Papanicolaou smear of cervix Rx: - Cytology - PAP( [place])  2. Postmenopause - doing well  3. Obesity (BMI 30.0-34.9) - weight reduction recommended  4. Tobacco dependence due to cigarettes - trying to quit.  Has not smoked in 6 months.     Plan:    Education reviewed: calcium supplements, depression evaluation, low fat, low cholesterol diet, safe sex/STD prevention, self breast exams, skin cancer screening, smoking cessation, and weight bearing exercise. Mammogram ordered. Follow up in: 1 year.    Shelly Bombard, MD 05/20/2022 10:16 AM

## 2022-05-20 NOTE — Telephone Encounter (Signed)
Called transferred to triage.  Raquel Sarna with [employer] reporting ZIO heart monitor results. Per Raquel Sarna: patient had symptomatic bradycardia, 39 bpm for 30 seconds (reference strip #20, page 19 on report). This occurred on 05/09/2022 at 2:09 AM.  Report available for review online.   Will forward to Dr. Gasper Sells to review and advise.

## 2022-05-20 NOTE — Progress Notes (Signed)
Patient presents for AEX.  Last Pap: 11/27/2020- Normal   Last Mammogram: 06/14/2021, normal- next scheduled 05/2022  Cycles/Contraception: Perimenopausal  Vaginal/Urinary Symptoms: None  STD Screen: Declines  Other Concerns: None

## 2022-05-23 LAB — CYTOLOGY - PAP: Diagnosis: NEGATIVE

## 2022-05-26 ENCOUNTER — Telehealth: Payer: Self-pay

## 2022-05-26 NOTE — Telephone Encounter (Signed)
The patient has been notified of the result and verbalized understanding.  All questions (if any) were answered. Kylie Gilding, RN 05/26/2022 9:37 AM  Pt reports Medicaid will not pay for Bystolic.  Has 5 days left advised pt to half medication until MD replies.  Pt would like an alternate med.

## 2022-05-26 NOTE — Telephone Encounter (Signed)
-----   Message from Werner Lean, MD sent at 05/22/2022 12:19 PM EST ----- Results: Symptomatic bradycardia Plan: Decrease nebivolol by half  Werner Lean, MD

## 2022-05-27 NOTE — Telephone Encounter (Signed)
Left a message to call back.

## 2022-05-30 MED ORDER — METOPROLOL SUCCINATE ER 25 MG PO TB24: 25.0000 mg | ORAL_TABLET | Freq: Every day | ORAL | 3 refills | Status: DC

## 2022-05-30 NOTE — Telephone Encounter (Signed)
Called pt advised of MD recommendation:  Metoprolol succinate 25 mg may both improve her LVOT gradient and should be covered        Med sent to pharmacy of pt request.  Advised to continue to monitor BP and HR.  Reports since nebivolol dose was decreased HR up to 50's and feels better. Will continue to monitor with switch to metoprolol.  All questions answered.

## 2022-05-31 ENCOUNTER — Ambulatory Visit: Payer: Medicaid Other | Attending: Genetic Counselor | Admitting: Genetic Counselor

## 2022-05-31 DIAGNOSIS — I421 Obstructive hypertrophic cardiomyopathy: Secondary | ICD-10-CM

## 2022-06-06 NOTE — Progress Notes (Signed)
Pre Test Genetic Consult  Referral Reason  Kylie Wright is referred for genetic consult and testing of hypertrophic cardiomyopathy.   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 Harl Bowie for further cardiac workup. Cardiac imaging studies detected cardiac wall thickness suggestive of HCM with severe asymmetric septal hypertrophy. She was later seen by Dr. Gasper Sells and initiated treatment with [facility]. She tells me that there was remarkable improvement of her symptoms. However, she began experiencing severe hair loss and this treatment was discontinued. She is scheduled to see Dr. Mina Marble @ [facility] to discuss septal reduction therapies.   She reports having dyspnea but attributed that to her smoking. She quit smoking 18 years ago. She does report feeling light-headed, fatigue, heart palpitations and chest discomfort. She tells me of a syncopal event in 2016 when she hit the pavement while walking with a friend. She was taken to the ER and was told that the syncope was vertigo-related. She did not see a cardiologist at that time.  Traditional Risk Factors Kylie Wright was found to have HTN at age 89 but states that it remains uncontrolled with medication.  Family history  Relation to Proband Pedigree # Current age Heart condition/age of onset Notes  Son IV.2 21 None Echo/EKG not done  Sisters, 2 III.1-III.2 75, 72 III.2- bradycardia> pacemaker @ 71 III.1- Normal Echo/EKG @ 74 III.2- Normal Echo/EKG @ 53  Nephew IV.1 48 None         Father II.11 Deceased None Died @ 90 from cancer  Paternal aunts/uncles II.1-II.12 Deceased None Died of old age or cancer  Paternal grandfather I.1 Deceased unaware Died @ ? from ?  Paternal grandmother I.2 Deceased unaware Died @ ? from ?         Mother II.12 Deceased M.I., stroke Died @ 31 from CHF, CHF dx@ 65, sickly all her life  Maternal Uncle II.13 Deceased None Died @ 53 from old age  Maternal Aunt II.14 Deceased None Died @ 70 from old age  Maternal grandfather I.3 Deceased None Died of old age  Maternal grandmother I.4 Deceased unaware Died @ ? of ?    Genetics Kylie Wright was counseled on the genetics of hypertrophic cardiomyopathy (HCM). I explained to the patient that this is an autosomal dominant condition with incomplete penetrance i.e. not all individuals harboring the HCM mutation will present clinically with HCM, and age-related penetrance where clinical presentation of HCM increases with advanced age. Variability in clinical expression is also seen in families with HCM with affected family members presenting clinically at different ages and with symptoms ranging from mild to severe.  Since HCM is an autosomal dominant condition, first degree-relatives should seek regular surveillance for HCM.  Her first-degree relatives include her son and older sisters. Clinical screening of first-degree relatives involves echocardiogram and EKG at regular intervals, frequency is typically determined by age, with those in their teens undergoing screening every year and those over the age of 10 getting screened every 3-5 years until the age of 56. Patient verbalized understanding of this.  I informed the patient that about 8-10% of HCM patients can have compound and digenic mutations for HCM. Also briefly discussed the inheritance pattern and treatment /management plans for the infiltrative cardiomyopathies that present as HCM phenocopies.   We walked  through the process of genetic testing. I explained to the patient that genetic testing is a probabilistic test dependent upon age and severity of presentation, presence of risk factors for HCM and importantly family history of HCM or sudden death in first-degree relatives.   The potential outcomes  of genetic testing and subsequent management of at-risk family members were discussed so as to manage expectations-  If a mutation is not identified, then all first-degree relatives should undergo regular screening for HCM. I emphasized that even if the genetic test is negative, it does not mean that the patient does not have HCM. A negative test result can be due to limitations of the genetic test. Patient verbalized understanding of this.  There is also the likelihood of identifying a "Variant of unknown significance". This result means that the variant has not been detected in a statistically significant number of HCM patients and/or functional studies have not been performed to verify its pathogenicity. This VUS can be tested in the family to see if it segregates with disease. If a VUS is found, first-degree relatives should undergo regular clinical screening for HCM.  If a pathogenic variant is reported, then first-degree family members can get tested for this variant. If they test positive, it is likely they will develop HCM. In light of variable expression and incomplete penetrance associated with HCM, it is not possible to predict when they will manifest clinically with HCM. It is recommended that family members that test positive for the familial pathogenic variant pursue clinical screening for HCM. Family members that test negative for the familial mutation need not pursue periodic screening for HCM, but seek care if symptoms develop.   Impression  Kylie Wright was found to have cardiac wall thickness suggestive of HCM at age 54 with obstructive physiology. While, there is no family history of HCM or sudden death amongst her first-degree relatives, she does report CHF in her mother. Based on patient's family tree, it is possible that she has either inherited this condition from her mother or has a de novo mutation for HCM.  Genetic testing for HCM is recommended. This test should include the sarcomeric  genes implicated in HCM as well as the genes for the HCM phenocopies, such as Fabry disease, Danon disease, WPW syndrome and familial transthyretin amyloidosis.  In addition, we discussed the protections afforded by the Genetic Information Non-Discrimination Act (GINA). I explained to the patient that GINA protects them from losing their employment or health insurance based on their genotype. However, these protections do not cover life insurance and disability. Patient verbalized understanding of this and states that her son does not have life insurance.  Please note that the patient has not been counseled in this visit on personal, cultural or ethical issues that they may face due to their heart condition.   Plan After a thorough discussion of the risk and benefits of genetic testing for HCM, Kylie Wright declines genetic testing for HCM due to insurance non-coverage as her insurance considers this test investigational and medically unnecessary. She will discuss with her family and inform them of the recommendations for regular screening by echocardiogram and EKG.   Lattie Corns, Ph.D, [facility] Clinical Molecular Geneticist

## 2022-06-07 ENCOUNTER — Telehealth (HOSPITAL_COMMUNITY): Payer: Self-pay | Admitting: Emergency Medicine

## 2022-06-07 NOTE — Telephone Encounter (Signed)
Attempted to call patient regarding upcoming cardiac MR appointment. Left message on voicemail with name and callback number Bracy Pepper RN Navigator Cardiac Imaging [facility] [phone] Office [phone] Cell  

## 2022-06-08 ENCOUNTER — Other Ambulatory Visit (HOSPITAL_COMMUNITY): Payer: Medicaid Other

## 2022-06-09 ENCOUNTER — Ambulatory Visit (HOSPITAL_COMMUNITY)
Admission: RE | Admit: 2022-06-09 | Discharge: 2022-06-09 | Disposition: A | Discharge status: Home / Self Care | Payer: Medicaid Other | Source: Ambulatory Visit | Attending: Internal Medicine | Admitting: Internal Medicine

## 2022-06-09 ENCOUNTER — Other Ambulatory Visit: Payer: Self-pay | Admitting: Internal Medicine

## 2022-06-09 DIAGNOSIS — I421 Obstructive hypertrophic cardiomyopathy: Secondary | ICD-10-CM

## 2022-06-09 DIAGNOSIS — E785 Hyperlipidemia, unspecified: Secondary | ICD-10-CM

## 2022-06-09 MED ORDER — GADOBUTROL 1 MMOL/ML IV SOLN
10.0000 mL | Freq: Once | INTRAVENOUS | Status: AC | PRN
  Administered 2022-06-09: 10 mL via INTRAVENOUS

## 2022-06-16 ENCOUNTER — Ambulatory Visit
Admission: RE | Admit: 2022-06-16 | Discharge: 2022-06-16 | Disposition: A | Discharge status: Home / Self Care | Payer: Medicaid Other | Source: Ambulatory Visit | Attending: Obstetrics | Admitting: Obstetrics

## 2022-06-16 DIAGNOSIS — Z1231 Encounter for screening mammogram for malignant neoplasm of breast: Secondary | ICD-10-CM | POA: Diagnosis not present

## 2022-07-12 ENCOUNTER — Other Ambulatory Visit: Payer: Self-pay | Admitting: Internal Medicine

## 2022-08-02 ENCOUNTER — Encounter: Payer: Self-pay | Admitting: Internal Medicine

## 2022-08-02 ENCOUNTER — Other Ambulatory Visit: Payer: Self-pay

## 2022-08-02 ENCOUNTER — Emergency Department: Payer: Medicaid Other

## 2022-08-02 ENCOUNTER — Telehealth: Payer: Self-pay | Admitting: Internal Medicine

## 2022-08-02 ENCOUNTER — Inpatient Hospital Stay
Admission: EM | Admit: 2022-08-02 | Discharge: 2022-08-04 | DRG: 065 | Disposition: A | Discharge status: Home Health | Payer: Medicaid Other | Attending: Internal Medicine | Admitting: Internal Medicine

## 2022-08-02 DIAGNOSIS — N3090 Cystitis, unspecified without hematuria: Secondary | ICD-10-CM

## 2022-08-02 DIAGNOSIS — Z8261 Family history of arthritis: Secondary | ICD-10-CM

## 2022-08-02 DIAGNOSIS — Z8673 Personal history of transient ischemic attack (TIA), and cerebral infarction without residual deficits: Secondary | ICD-10-CM | POA: Diagnosis present

## 2022-08-02 DIAGNOSIS — Z888 Allergy status to other drugs, medicaments and biological substances status: Secondary | ICD-10-CM

## 2022-08-02 DIAGNOSIS — R29702 NIHSS score 2: Secondary | ICD-10-CM | POA: Diagnosis present

## 2022-08-02 DIAGNOSIS — I639 Cerebral infarction, unspecified: Secondary | ICD-10-CM

## 2022-08-02 DIAGNOSIS — Z886 Allergy status to analgesic agent status: Secondary | ICD-10-CM

## 2022-08-02 DIAGNOSIS — Z818 Family history of other mental and behavioral disorders: Secondary | ICD-10-CM

## 2022-08-02 DIAGNOSIS — Z83438 Family history of other disorder of lipoprotein metabolism and other lipidemia: Secondary | ICD-10-CM

## 2022-08-02 DIAGNOSIS — N1831 Chronic kidney disease, stage 3a: Secondary | ICD-10-CM | POA: Diagnosis present

## 2022-08-02 DIAGNOSIS — F319 Bipolar disorder, unspecified: Secondary | ICD-10-CM | POA: Diagnosis present

## 2022-08-02 DIAGNOSIS — I129 Hypertensive chronic kidney disease with stage 1 through stage 4 chronic kidney disease, or unspecified chronic kidney disease: Secondary | ICD-10-CM | POA: Diagnosis present

## 2022-08-02 DIAGNOSIS — Z6836 Body mass index (BMI) 36.0-36.9, adult: Secondary | ICD-10-CM

## 2022-08-02 DIAGNOSIS — G4700 Insomnia, unspecified: Secondary | ICD-10-CM | POA: Diagnosis present

## 2022-08-02 DIAGNOSIS — Z800 Family history of malignant neoplasm of digestive organs: Secondary | ICD-10-CM

## 2022-08-02 DIAGNOSIS — Z87891 Personal history of nicotine dependence: Secondary | ICD-10-CM

## 2022-08-02 DIAGNOSIS — I421 Obstructive hypertrophic cardiomyopathy: Secondary | ICD-10-CM | POA: Diagnosis present

## 2022-08-02 DIAGNOSIS — F909 Attention-deficit hyperactivity disorder, unspecified type: Secondary | ICD-10-CM | POA: Diagnosis present

## 2022-08-02 DIAGNOSIS — E669 Obesity, unspecified: Secondary | ICD-10-CM | POA: Diagnosis present

## 2022-08-02 DIAGNOSIS — I10 Essential (primary) hypertension: Secondary | ICD-10-CM | POA: Diagnosis present

## 2022-08-02 DIAGNOSIS — F4310 Post-traumatic stress disorder, unspecified: Secondary | ICD-10-CM | POA: Diagnosis present

## 2022-08-02 DIAGNOSIS — Z79899 Other long term (current) drug therapy: Secondary | ICD-10-CM

## 2022-08-02 DIAGNOSIS — Z823 Family history of stroke: Secondary | ICD-10-CM

## 2022-08-02 DIAGNOSIS — R42 Dizziness and giddiness: Secondary | ICD-10-CM

## 2022-08-02 DIAGNOSIS — E785 Hyperlipidemia, unspecified: Secondary | ICD-10-CM | POA: Diagnosis present

## 2022-08-02 DIAGNOSIS — Z833 Family history of diabetes mellitus: Secondary | ICD-10-CM

## 2022-08-02 DIAGNOSIS — I63519 Cerebral infarction due to unspecified occlusion or stenosis of unspecified middle cerebral artery: Principal | ICD-10-CM | POA: Diagnosis present

## 2022-08-02 DIAGNOSIS — Z811 Family history of alcohol abuse and dependence: Secondary | ICD-10-CM

## 2022-08-02 DIAGNOSIS — Z809 Family history of malignant neoplasm, unspecified: Secondary | ICD-10-CM

## 2022-08-02 DIAGNOSIS — Z8249 Family history of ischemic heart disease and other diseases of the circulatory system: Secondary | ICD-10-CM

## 2022-08-02 DIAGNOSIS — S060X0A Concussion without loss of consciousness, initial encounter: Secondary | ICD-10-CM

## 2022-08-02 LAB — URINALYSIS, ROUTINE W REFLEX MICROSCOPIC
Bilirubin Urine: NEGATIVE
Glucose, UA: NEGATIVE mg/dL
Ketones, ur: NEGATIVE mg/dL
Nitrite: NEGATIVE
Protein, ur: NEGATIVE mg/dL
Specific Gravity, Urine: >1.03 — ABNORMAL HIGH (ref 1.005–1.030)
pH: 5.5 (ref 5.0–8.0)

## 2022-08-02 LAB — BASIC METABOLIC PANEL
Anion gap: 8 (ref 5–15)
BUN: 15 mg/dL (ref 6–20)
CO2: 21 mmol/L — ABNORMAL LOW (ref 22–32)
Calcium: 8.7 mg/dL — ABNORMAL LOW (ref 8.9–10.3)
Chloride: 103 mmol/L (ref 98–111)
Creatinine, Ser: 1.36 mg/dL — ABNORMAL HIGH (ref 0.44–1.00)
GFR, Estimated: 45 mL/min — ABNORMAL LOW (ref >60)
Glucose, Bld: 132 mg/dL — ABNORMAL HIGH (ref 70–99)
Potassium: 4.1 mmol/L (ref 3.5–5.1)
Sodium: 132 mmol/L — ABNORMAL LOW (ref 135–145)

## 2022-08-02 LAB — CBC
HCT: 37 % (ref 36.0–46.0)
Hemoglobin: 12.1 g/dL (ref 12.0–15.0)
MCH: 32.3 pg (ref 26.0–34.0)
MCHC: 32.7 g/dL (ref 30.0–36.0)
MCV: 98.7 fL (ref 80.0–100.0)
Platelets: 203 10*3/uL (ref 150–400)
RBC: 3.75 MIL/uL — ABNORMAL LOW (ref 3.87–5.11)
RDW: 14.3 % (ref 11.5–15.5)
WBC: 10.3 10*3/uL (ref 4.0–10.5)
nRBC: 0 % (ref 0.0–0.2)

## 2022-08-02 LAB — URINE DRUG SCREEN, QUALITATIVE (ARMC ONLY)
Amphetamines, Ur Screen: POSITIVE — AB
Barbiturates, Ur Screen: NOT DETECTED
Benzodiazepine, Ur Scrn: NOT DETECTED
Cannabinoid 50 Ng, Ur ~~LOC~~: NOT DETECTED
Cocaine Metabolite,Ur ~~LOC~~: NOT DETECTED
MDMA (Ecstasy)Ur Screen: NOT DETECTED
Methadone Scn, Ur: NOT DETECTED
Opiate, Ur Screen: NOT DETECTED
Phencyclidine (PCP) Ur S: NOT DETECTED
Tricyclic, Ur Screen: NOT DETECTED

## 2022-08-02 LAB — URINALYSIS, MICROSCOPIC (REFLEX)

## 2022-08-02 MED ORDER — ACETAMINOPHEN 325 MG PO TABS: 650.0000 mg | ORAL_TABLET | ORAL | Status: DC | PRN

## 2022-08-02 MED ORDER — OLANZAPINE-SAMIDORPHAN 5-10 MG PO TABS: 1.0000 | ORAL_TABLET | Freq: Every day | ORAL | Status: DC

## 2022-08-02 MED ORDER — ENOXAPARIN SODIUM 40 MG/0.4ML IJ SOSY: 40.0000 mg | PREFILLED_SYRINGE | INTRAMUSCULAR | Status: DC

## 2022-08-02 MED ORDER — SENNOSIDES-DOCUSATE SODIUM 8.6-50 MG PO TABS: 1.0000 | ORAL_TABLET | Freq: Every evening | ORAL | Status: DC | PRN

## 2022-08-02 MED ORDER — CEPHALEXIN 500 MG PO CAPS: 500.0000 mg | ORAL_CAPSULE | Freq: Two times a day (BID) | ORAL | 0 refills | Status: DC

## 2022-08-02 MED ORDER — ASPIRIN 81 MG PO CHEW
324.0000 mg | CHEWABLE_TABLET | Freq: Once | ORAL | Status: AC
  Administered 2022-08-02: 324 mg via ORAL
  Filled 2022-08-02: qty 4

## 2022-08-02 MED ORDER — ACETAMINOPHEN 160 MG/5ML PO SOLN: 650.0000 mg | ORAL | Status: DC | PRN

## 2022-08-02 MED ORDER — STROKE: EARLY STAGES OF RECOVERY BOOK: Freq: Once | Status: AC

## 2022-08-02 MED ORDER — DISOPYRAMIDE PHOSPHATE 100 MG PO CAPS
100.0000 mg | ORAL_CAPSULE | Freq: Four times a day (QID) | ORAL | Status: DC
  Administered 2022-08-03 – 2022-08-04 (×4): 100 mg via ORAL
  Filled 2022-08-02 (×4): qty 1

## 2022-08-02 MED ORDER — CEPHALEXIN 500 MG PO CAPS
500.0000 mg | ORAL_CAPSULE | Freq: Once | ORAL | Status: AC
  Administered 2022-08-02: 500 mg via ORAL
  Filled 2022-08-02: qty 1

## 2022-08-02 MED ORDER — AMPHETAMINE-DEXTROAMPHET ER 20 MG PO CP24
20.0000 mg | ORAL_CAPSULE | Freq: Every morning | ORAL | Status: DC
  Administered 2022-08-03 – 2022-08-04 (×2): 20 mg via ORAL
  Filled 2022-08-02 (×2): qty 4

## 2022-08-02 MED ORDER — POLYETHYLENE GLYCOL 3350 17 G PO PACK: 17.0000 g | PACK | Freq: Every day | ORAL | Status: DC | PRN

## 2022-08-02 MED ORDER — GADOBUTROL 1 MMOL/ML IV SOLN
9.0000 mL | Freq: Once | INTRAVENOUS | Status: AC | PRN
  Administered 2022-08-02: 9 mL via INTRAVENOUS

## 2022-08-02 MED ORDER — MELATONIN 5 MG PO TABS
10.0000 mg | ORAL_TABLET | Freq: Every day | ORAL | Status: DC
  Administered 2022-08-02 – 2022-08-03 (×2): 10 mg via ORAL
  Filled 2022-08-02 (×2): qty 2

## 2022-08-02 MED ORDER — ROSUVASTATIN CALCIUM 10 MG PO TABS
10.0000 mg | ORAL_TABLET | Freq: Every day | ORAL | Status: DC
  Administered 2022-08-02 – 2022-08-04 (×3): 10 mg via ORAL
  Filled 2022-08-02 (×3): qty 1

## 2022-08-02 MED ORDER — HYDRALAZINE HCL 20 MG/ML IJ SOLN: 5.0000 mg | Freq: Three times a day (TID) | INTRAMUSCULAR | Status: DC | PRN

## 2022-08-02 MED ORDER — ACETAMINOPHEN 650 MG RE SUPP: 650.0000 mg | RECTAL | Status: DC | PRN

## 2022-08-02 MED ORDER — DISOPYRAMIDE PHOSPHATE 100 MG PO CAPS
100.0000 mg | ORAL_CAPSULE | Freq: Four times a day (QID) | ORAL | Status: DC
  Filled 2022-08-02: qty 1

## 2022-08-02 MED ORDER — DOCUSATE SODIUM 100 MG PO CAPS: 100.0000 mg | ORAL_CAPSULE | Freq: Every day | ORAL | Status: DC | PRN

## 2022-08-02 NOTE — Telephone Encounter (Signed)
Patient's sister says that sister was taken by ambulance to hospital. Says she is getting a MRI right now. Would like a call back. She is not on DPR

## 2022-08-02 NOTE — Assessment & Plan Note (Signed)
This meets criteria for morbid obesity based on the presence of 1 or more chronic comorbidities. Patient has hypertension and hyperlipidemia with BMI of 36.2. This complicates overall care and prognosis.

## 2022-08-02 NOTE — Telephone Encounter (Signed)
Left a message to call back.  Pt is currently in the hospital.

## 2022-08-02 NOTE — Hospital Course (Signed)
Kylie Wright is a 57 year old female with history of bipolar disorder, ADHD, chronic dizziness, hyperlipidemia, insomnia, who presents to the emergency department for chief concerns of strokelike symptoms.  Patient was trying to get ready for work and she could not remember how to put on her close and/or how to get to work.  Initial vitals in the ED showed temperature of 98.4, respiration rate of 22, heart rate 68, blood pressure 171/100, SpO2 of 93% on room air.  Serum sodium is 132, potassium 4.1, chloride 103, bicarb 21, BUN of 15, serum creatinine 1.36, EGFR of 45, nonfasting glucose 132, WBC 10.3, hemoglobin 12.1, platelets of 203.  UA showed trace leukocytes.  ED treatment: Aspirin 324 mg p.o. one-time dose, cephalexin 500 mg p.o. one-time dose.

## 2022-08-02 NOTE — ED Provider Notes (Signed)
-----------------------------------------   3:12 PM on 08/02/2022 -----------------------------------------  Blood pressure (!) 171/100, pulse 71, temperature 98.4 F (36.9 C), temperature source Oral, resp. rate 18, height 5\' 2"  (1.575 m), weight 89.8 kg, SpO2 93 %.  Assuming care from Dr. Scotty Court.  In short, Kylie Wright is a 57 y.o. female with a chief complaint of Altered Mental Status .  Refer to the original H&P for additional details.  The current plan of care is to follow-up MRI results for dizziness.  ----------------------------------------- 4:43 PM on 08/02/2022 ----------------------------------------- MRI concerning for acute/early subacute stroke, which could explain patient's presentation.  She is asymptomatic on reassessment with no focal deficits, but would benefit from admission for further stroke workup.  She was given loading dose of aspirin and case discussed with hospitalist for admission.    Chesley Noon, MD 08/02/22 [phone]

## 2022-08-02 NOTE — Assessment & Plan Note (Signed)
Olanzapine-samidorphan 5-10 milligrams daily resumed

## 2022-08-02 NOTE — ED Notes (Signed)
Pt ambulated to bathroom with this RN as stand by. Pt went to bathroom, and is now back in bed. Pt on cardiac, bp and pulse ox monitor. Primary RN aware.

## 2022-08-02 NOTE — ED Triage Notes (Signed)
First nurse note: Pt here via [facility] with c/o of AMS this morning, pt is A&Ox4 at this time per EMS.   CBG 152 150/110 HR: 82 98%

## 2022-08-02 NOTE — ED Provider Notes (Signed)
[facility] Provider Note    Event Date/Time   First MD Initiated Contact with Patient 08/02/22 1236     (approximate)   History   Chief Complaint: Altered Mental Status   HPI  Kylie Wright is a 57 y.o. female with a history of bipolar disorder, ADHD, chronic dizziness who comes the ED with dizziness which has been going on for the past several days, worse with standing and walking.  This is a recurrent, chronic issue for her.  She also complains of generalized headache.  No vision changes such as blurry vision or double vision or loss of peripheral vision.  No focal paresthesia or motor weakness.  She does report that she fell in her bathroom a few days ago around the start of the symptoms, and hit the back of her head.  She thinks she might have a concussion.  Does not take blood thinners.  No speech or language difficulty.  Confusion was exhibited today by difficulty dressing herself per her usual routines.  She reports initially she forgot to put on her bra before getting in her car to go to work, and when she went back inside to get 1, she then forgot to put a shirt back on before leaving the house.  She also styled her hair differently than she does every day.  Patient notes that she was due to give a presentation at work today and was somewhat nervous and distracted about this.  She does report dysuria and urinary frequency overnight.     Physical Exam   Triage Vital Signs: ED Triage Vitals  Enc Vitals Group     BP 08/02/22 1017 (!) 155/99     Pulse Rate 08/02/22 1017 68     Resp 08/02/22 1017 18     Temp 08/02/22 1017 98.3 F (36.8 C)     Temp src --      SpO2 08/02/22 1017 93 %     Weight 08/02/22 1020 198 lb (89.8 kg)     Height 08/02/22 1020 5\' 2"  (1.575 m)     Head Circumference --      Peak Flow --      Pain Score 08/02/22 1020 0     Pain Loc --      Pain Edu? --      Excl. in GC? --     Most recent vital signs: Vitals:    08/02/22 1017 08/02/22 1330  BP: (!) 155/99 (!) 171/100  Pulse: 68 71  Resp: 18 18  Temp: 98.3 F (36.8 C) 98.4 F (36.9 C)  SpO2: 93% 93%    General: Awake, no distress.  CV:  Good peripheral perfusion.  Regular rate and rhythm Resp:  Normal effort.  Clear to auscultation bilaterally Abd:  No distention.  Soft nontender Other:  Alert, oriented x 4.  Cranial nerves III through XII intact. No motor drift.  Normal finger-to-nose and heel shin.  Normal speech and language.  Normal judgment NIH stroke scale 0   ED Results / Procedures / Treatments   Labs (all labs ordered are listed, but only abnormal results are displayed) Labs Reviewed  CBC - Abnormal; Notable for the following components:      Result Value   RBC 3.75 (*)    All other components within normal limits  BASIC METABOLIC PANEL - Abnormal; Notable for the following components:   Sodium 132 (*)    CO2 21 (*)    Glucose, Bld 132 (*)  Creatinine, Ser 1.36 (*)    Calcium 8.7 (*)    GFR, Estimated 45 (*)    All other components within normal limits  URINALYSIS, ROUTINE W REFLEX MICROSCOPIC - Abnormal; Notable for the following components:   Specific Gravity, Urine >1.030 (*)    Hgb urine dipstick MODERATE (*)    Leukocytes,Ua TRACE (*)    All other components within normal limits  URINALYSIS, MICROSCOPIC (REFLEX) - Abnormal; Notable for the following components:   Bacteria, UA MANY (*)    All other components within normal limits     EKG Interpreted by me Normal sinus rhythm rate of 62.  Normal axis intervals QRS ST segments and T waves   RADIOLOGY CT head interpreted by me, negative for mass or intracranial hemorrhage.  No visible infarct.  Radiology report reviewed   PROCEDURES:  Procedures   MEDICATIONS ORDERED IN ED: Medications  gadobutrol (GADAVIST) 1 MMOL/ML injection 9 mL (has no administration in time range)     IMPRESSION / MDM / ASSESSMENT AND PLAN / ED COURSE  I reviewed the triage  vital signs and the nursing notes.  DDx: Cystitis, anemia, electrolyte abnormality, dehydration, anxiety, intracranial mass, intracranial hemorrhage, cerebellar infarction, posterior circulation syndrome  Patient's presentation is most consistent with acute presentation with potential threat to life or bodily function.  Patient presents with dizziness and confusion.  Dizziness may be acute on chronic or concussion related.  Labs unremarkable except for UA consistent with a UTI.  Low suspicion for stroke but with her persistent dizziness symptoms, will obtain MRI/MRA to evaluate for cerebellar infarct or posterior circulation occlusion.       FINAL CLINICAL IMPRESSION(S) / ED DIAGNOSES   Final diagnoses:  Cystitis  Concussion without loss of consciousness, initial encounter  Dizziness     Rx / DC Orders   ED Discharge Orders     None        Note:  This document was prepared using Dragon voice recognition software and may include unintentional dictation errors.   Sharman Cheek, MD 08/02/22 [phone]

## 2022-08-02 NOTE — Assessment & Plan Note (Addendum)
Continue to follow-up with cardiology Disopyramide 100 mg p.o., 4 times daily resumed

## 2022-08-02 NOTE — H&P (Signed)
History and Physical   KADEJA NEED WUJ:[id number] DOB: 03/02/1966 DOA: 08/02/2022  PCP: Pincus Sanes, MD  Outpatient Specialists: Dr. Nolon Rod, [facility] Patient coming from: Home via EMS  I have personally briefly reviewed patient's old medical records in [facility] EMR.  Chief Concern: Strokelike symptoms  HPI: Ms. Kylie Wright is a 57 year old female with history of bipolar disorder, ADHD, chronic dizziness, hyperlipidemia, insomnia, who presents to the emergency department for chief concerns of strokelike symptoms.  Patient was trying to get ready for work and she could not remember how to put on her close and/or how to get to work.  Initial vitals in the ED showed temperature of 98.4, respiration rate of 22, heart rate 68, blood pressure 171/100, SpO2 of 93% on room air.  Serum sodium is 132, potassium 4.1, chloride 103, bicarb 21, BUN of 15, serum creatinine 1.36, EGFR of 45, nonfasting glucose 132, WBC 10.3, hemoglobin 12.1, platelets of 203.  UA showed trace leukocytes.  ED treatment: Aspirin 324 mg p.o. one-time dose, cephalexin 500 mg p.o. one-time dose. ----------------------------------- In the room, patient had ambulated from the restroom and is sitting in a chair inside her room.  She does not appear to be in acute distress.  Her speech is delayed however I do not know her baseline speech speed.  While next to her, patient was able to tell me her full name, age, location, current calendar year.  She reports that she woke up this morning and she felt very tired and drained.  She had difficulty putting on close and she had forgotten to comb her hair.  When she was walking to her car she noticed that she did not have her phone charger and or her bra so she came back inside to the house and thought that she put on her bra.  At work she noticed that she did not have a bra on and that the 2 tops that she was wearing was not on appropriately.  She wore a bright purple  fluffy jacket to work.  She did not put her hair up and normally she has her hair up.  One of her coworkers asked her if she was okay and she states no.  She reports she had difficulty driving however continued and got to work.  She denies current chest pain, shortness of breath, dysuria, hematuria, diarrhea.  She reports that she has a baseline chest discomfort and shortness of breath from HOCM and this has been unchanged.  Of note she said that on Sunday evening, in the shower, she fell and hit her head against the bathtub.  She denies losing consciousness.  He denies bleeding.  She further endorses that over the last month she has been having trouble with balance and walking.  Social history: She lives at home by herself.  She is a former tobacco user.  She quit smoking in January 2024 after left heart cath procedure.  At her peak she was smoking 1 pack/day.  She denies EtOH and recreational drug use.  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  ROS: Constitutional: no weight change, no fever ENT/Mouth: no sore throat, no rhinorrhea Eyes: no eye pain, no vision changes Cardiovascular: + chest pain, + dyspnea,  no edema, no palpitations Respiratory: no cough, no sputum, no wheezing Gastrointestinal: no nausea, no vomiting, no diarrhea, no constipation Genitourinary: no urinary incontinence, no dysuria, no hematuria Musculoskeletal: no arthralgias, no myalgias Skin: no skin  lesions, no pruritus, Neuro: + weakness, no loss of consciousness, no syncope Psych: no anxiety, no depression, no decrease appetite Heme/Lymph: no bruising, no bleeding  ED Course: Discussed with emergency medicine provider, patient requiring hospitalization for chief concerns of stroke.  Assessment/Plan  Principal Problem:   Stroke [facility]) Active Problems:   Bipolar disorder (HCC)   Hyperlipidemia   PTSD (post-traumatic stress disorder)   Hypertension   Obesity (BMI  30.0-34.9)   HOCM (hypertrophic obstructive cardiomyopathy) (HCC)   Assessment and Plan:  * Stroke (HCC) Left Corona radiata acute/early subacute infarct Left basal ganglia acute/early subacute infarct Neurology has been consulted via secure chat Complete echo has been ordered Fasting lipid panel Permissive hypertension: Hydralazine 5 mg IV every 8 hours as needed for SBP greater than 180 mmHg Frequent neuro vascular checks PT, OT Fall precaution   HOCM (hypertrophic obstructive cardiomyopathy) (HCC) Continue to follow-up with cardiology Disopyramide 100 mg p.o., 4 times daily resumed  Obesity (BMI 30.0-34.9) This meets criteria for morbid obesity based on the presence of 1 or more chronic comorbidities. Patient has hypertension and hyperlipidemia with BMI of 36.2. This complicates overall care and prognosis.  Hypertension Hydralazine 5 mg IV every 8 hours as needed for SBP greater than 180, 4 days ordered  Hyperlipidemia Resumed home rosuvastatin 10 mg daily Check fasting lipid panel in the a.m.  Bipolar disorder (HCC) Olanzapine-samidorphan 5-10 milligrams daily resumed  Chart reviewed.   DVT prophylaxis: Enoxaparin Code Status: Full code Diet: Heart healthy Family Communication: A phone call with offered, patient declines stating that her son already knows she is in the hospital. Disposition Plan: Pending clinical course Consults called: Neurology Admission status: Telemetry medical, observation  Past Medical History:  Diagnosis Date   ADHD (attention deficit hyperactivity disorder)    Bipolar 1 disorder (HCC)    History of alcohol abuse    History of drug abuse (HCC)    Interstitial cystitis    Other hypertrophic cardiomyopathy (HCC)    Past Surgical History:  Procedure Laterality Date   ADENOIDECTOMY  1971   APPENDECTOMY     RIGHT/LEFT HEART CATH AND CORONARY ANGIOGRAPHY N/A 03/30/2022   Procedure: RIGHT/LEFT HEART CATH AND CORONARY ANGIOGRAPHY;  Surgeon:  Lennette Bihari, MD;  Location: [facility] INVASIVE CV LAB;  Service: Cardiovascular;  Laterality: N/A;   SALPINGECTOMY  1993   TEE WITHOUT CARDIOVERSION N/A 03/30/2022   Procedure: TRANSESOPHAGEAL ECHOCARDIOGRAM (TEE);  Surgeon: Christell Constant, MD;  Location: [facility] ENDOSCOPY;  Service: Cardiovascular;  Laterality: N/A;   vaginal surgery     Social History:  reports that she quit smoking about 4 months ago. Her smoking use included cigarettes. She smoked an average of .5 packs per day. She has never used smokeless tobacco. She reports that she does not drink alcohol and does not use drugs.  Allergies  Allergen Reactions   Aspirin Other (See Comments)    Patient reports Dr Sherron Monday has advised her not to take aspirin because of interstitial cystitis.    Mavacamten Other (See Comments)    Hair loss   Family History  Problem Relation Age of Onset   Arthritis Mother    Hyperlipidemia Mother    Heart disease Mother    Stroke Mother    Hypertension Mother    Diabetes Mother    Mental illness Mother    Alcohol abuse Father    Hypertension Other    Diabetes Other    Mental illness Other    Cancer Other    Cancer  Sister 22       small intestine    Alcoholism Other    Family history: Family history reviewed and not pertinent  Prior to Admission medications   Medication Sig Start Date End Date Taking? Authorizing Provider  cephALEXin (KEFLEX) 500 MG capsule Take 1 capsule (500 mg total) by mouth 2 (two) times daily for 5 days. 08/02/22 08/07/22 Yes Sharman Cheek, MD  amphetamine-dextroamphetamine (ADDERALL XR) 20 MG 24 hr capsule Take 20 mg by mouth every morning. 04/01/17   [provider]  disopyramide (NORPACE) 100 MG capsule Take 1 capsule (100 mg total) by mouth 4 (four) times daily. 09/08/21   Chandrasekhar, [place] Dragon A, MD  docusate sodium (COLACE) 100 MG capsule Take 100 mg by mouth at bedtime. Can take up to 3 tabs per day as needed    [provider]  meclizine  (DRAMAMINE) 25 MG tablet Take 25 mg by mouth 3 (three) times daily as needed for dizziness.    [provider]  Melatonin 10 MG TABS Take 10 mg by mouth at bedtime.    [provider]  meloxicam (MOBIC) 7.5 MG tablet TAKE 1 TABLET BY MOUTH TWICE  DAILY AS NEEDED FOR PAIN 10/08/21   Pincus Sanes, MD  metoprolol succinate (TOPROL XL) 25 MG 24 hr tablet Take 1 tablet (25 mg total) by mouth daily. 05/30/22   Chandrasekhar, Mahesh A, MD  OLANZapine-Samidorphan 5-10 MG TABS Take 1 tablet by mouth daily.    [provider]  Olopatadine HCl (PAZEO) 0.7 % SOLN Place 1 drop into both eyes daily as needed (allergies).    [provider]  polyethylene glycol (MIRALAX / GLYCOLAX) 17 g packet Take 17 g by mouth at bedtime.    [provider]  Potassium 99 MG TABS Take 99 mg by mouth daily.    [provider]  ROCKLATAN 0.02-0.005 % SOLN SMARTSIG:1 Drop(s) In Eye(s) Every Evening 04/13/22   [provider]  rosuvastatin (CRESTOR) 10 MG tablet TAKE 1 TABLET (10 MG TOTAL) BY MOUTH DAILY. 07/12/22   Pincus Sanes, MD  solifenacin (VESICARE) 10 MG tablet Take 10 mg by mouth daily. 05/29/17   [provider]  spironolactone (ALDACTONE) 25 MG tablet Take 1 tablet (25 mg total) by mouth daily. 03/08/22   Christell Constant, MD   Physical Exam: Vitals:   08/02/22 1020 08/02/22 1330 08/02/22 1450 08/02/22 1455  BP:  (!) 171/100    Pulse:  71 72 75  Resp:  18 13 18   Temp:  98.4 F (36.9 C)    TempSrc:  Oral    SpO2:  93% 99% 96%  Weight: 89.8 kg     Height: 5\' 2"  (1.575 m)      Constitutional: appears age-appropriate, NAD , calm Eyes: PERRL, lids and conjunctivae normal ENMT: Mucous membranes are moist. Posterior pharynx clear of any exudate or lesions. Age-appropriate dentition. Hearing appropriate Neck: normal, supple, no masses, no thyromegaly Respiratory: clear to auscultation bilaterally, no wheezing, no crackles. Normal respiratory  effort. No accessory muscle use.  Cardiovascular: Regular rate and rhythm, no murmurs / rubs / gallops. No extremity edema. 2+ pedal pulses. No carotid bruits.  Abdomen: Obese abdomen, no tenderness, no masses palpated, no hepatosplenomegaly. Bowel sounds positive.  Musculoskeletal: no clubbing / cyanosis. No joint deformity upper and lower extremities. Good ROM, no contractures, no atrophy. Normal muscle tone.  Skin: no rashes, lesions, ulcers. No induration Neurologic: Sensation intact. Strength 5/5 in all 4.  Psychiatric:  Normal judgment and insight. Alert and oriented x 3. Normal mood.   EKG: independently reviewed, showing sinus rhythm with rate of 62, QTc 464  Chest x-ray on Admission: I personally reviewed and I agree with radiologist reading as below.  MR BRAIN WO CONTRAST  Result Date: 08/02/2022 CLINICAL DATA:  Provided history: Neuro deficit, acute, stroke suspected. Altered mental status. EXAM: MRI HEAD WITHOUT CONTRAST MRA HEAD WITHOUT CONTRAST MRA NECK WITHOUT AND WITH CONTRAST TECHNIQUE: Multiplanar, multi-echo pulse sequences of the brain and surrounding structures were acquired without intravenous contrast. Angiographic images of the Circle of Willis were acquired using MRA technique without intravenous contrast. Angiographic images of the neck were acquired using MRA technique without and with intravenous contrast. Carotid stenosis measurements (when applicable) are obtained utilizing NASCET criteria, using the distal internal carotid diameter as the denominator. CONTRAST:  9mL GADAVIST GADOBUTROL 1 MMOL/ML IV SOLN COMPARISON:  Head CT 08/02/2022 FINDINGS: MRI HEAD FINDINGS Intermittently motion degraded examination (with up to moderate motion degradation of the acquired sequences). Brain: No age advanced or lobar predominant parenchymal atrophy. 2 mm focus of restricted diffusion within the left corona radiata consistent with an acute/early subacute infarct (series 5, image 35)  (series 7, image 20). 2 mm focus of restricted diffusion within the left lentiform nucleus consistent with an acute/early subacute infarct (series 5, image 28) (series 7, image 25). Apparent tiny foci of diffusion-weighted signal abnormality within the right corona radiata appreciated on the axial diffusion-weighted sequence only, not appreciated on the coronal diffusion-weighted sequence, and likely artifactual. Small nonspecific T2 FLAIR hyperintense remote insult within the right frontoparietal white matter (with associated wallerian degeneration extending inferiorly within the cortical spinal tract). No evidence of an intracranial mass. No chronic intracranial blood products. No extra-axial fluid collection. No midline shift. Vascular: Maintained flow voids within the proximal large arterial vessels. Skull and upper cervical spine: No focal suspicious marrow lesion. Sinuses/Orbits: No mass or acute finding within the imaged orbits. Minimal mucosal thickening within the left ethmoid and bilateral sphenoid sinuses. Other: Small-volume fluid within the right mastoid air cells. MRA HEAD FINDINGS Mild to moderately motion degraded exam. Anterior circulation: ICA The M1 middle cerebral arteries are patent. No M2 proximal branch occlusion or high-grade proximal stenosis. The anterior cerebral arteries are patent. No intracranial aneurysm is identified. Posterior circulation: The intracranial vertebral arteries are patent. The right vertebral artery is developmentally diminutive beyond the PICA origin, but patent. The basilar artery is patent. The posterior cerebral arteries are patent. A right posterior communicating artery is present. The left posterior communicating artery is diminutive or absent. Anatomic variants: As described. MRA NECK FINDINGS Mild to moderately motion degraded exam. Aortic arch: Standard aortic branching. The visualized aortic arch is normal in caliber. No hemodynamically significant innominate  or proximal subclavian artery stenosis. Right carotid system: The common carotid and internal carotid arteries are patent within the neck. Apparent atherosclerotic narrowing within the proximal ICA of up to 70%. Motion artifact limits evaluation of the proximal CCA. Within this limitation, no other hemodynamically significant stenosis is identified. Left carotid system: CCA and ICA patent within the neck. Less than 50% atherosclerotic narrowing of the proximal ICA. Motion degradation limits evaluation of the proximal CCA. Within this limitation, no hemodynamically significant stenosis is identified elsewhere within the left carotid system within the neck. Vertebral arteries: Vertebral arteries patent within the neck. Apparent high-grade stenoses within the proximal V1 segments bilaterally. Other: None IMPRESSION: MRI brain: 1. Intermittently motion degraded exam. 2. 2 mm acute/early subacute  infarct within the left corona radiata. 3. 2 mm acute/early subacute infarct within the left basal ganglia. 4. Small nonspecific T2 FLAIR hyperintense remote insult within the right frontoparietal white matter. 5. Otherwise unremarkable non-contrast MRI appearance of the brain. 6. Small-volume fluid within the right mastoid air cells. MRA head: 1. Mild-to-moderately motion degraded exam. 2. No intracranial large vessel occlusion or proximal high-grade arterial stenosis identified. MRA neck: 1. Motion degraded exam. 2. The common carotid and internal carotid arteries are patent within the neck. There is apparent atherosclerotic narrowing of the proximal right ICA of up to 70%. However, it is possible that this stenosis is exaggerated by motion artifact on the current exam. Consider a CTA of the neck for further evaluation. 3. Vertebral arteries patent within the neck. There are apparent severe stenoses at the origins of the both vertebral arteries. However, these apparent stenoses could be exaggerated by motion artifact on the  current exam. Consider a CTA of the neck for further evaluation. Electronically Signed   By: Jackey Loge D.O.   On: 08/02/2022 15:36   MR ANGIO HEAD WO CONTRAST  Result Date: 08/02/2022 CLINICAL DATA:  Provided history: Neuro deficit, acute, stroke suspected. Altered mental status. EXAM: MRI HEAD WITHOUT CONTRAST MRA HEAD WITHOUT CONTRAST MRA NECK WITHOUT AND WITH CONTRAST TECHNIQUE: Multiplanar, multi-echo pulse sequences of the brain and surrounding structures were acquired without intravenous contrast. Angiographic images of the Circle of Willis were acquired using MRA technique without intravenous contrast. Angiographic images of the neck were acquired using MRA technique without and with intravenous contrast. Carotid stenosis measurements (when applicable) are obtained utilizing NASCET criteria, using the distal internal carotid diameter as the denominator. CONTRAST:  9mL GADAVIST GADOBUTROL 1 MMOL/ML IV SOLN COMPARISON:  Head CT 08/02/2022 FINDINGS: MRI HEAD FINDINGS Intermittently motion degraded examination (with up to moderate motion degradation of the acquired sequences). Brain: No age advanced or lobar predominant parenchymal atrophy. 2 mm focus of restricted diffusion within the left corona radiata consistent with an acute/early subacute infarct (series 5, image 35) (series 7, image 20). 2 mm focus of restricted diffusion within the left lentiform nucleus consistent with an acute/early subacute infarct (series 5, image 28) (series 7, image 25). Apparent tiny foci of diffusion-weighted signal abnormality within the right corona radiata appreciated on the axial diffusion-weighted sequence only, not appreciated on the coronal diffusion-weighted sequence, and likely artifactual. Small nonspecific T2 FLAIR hyperintense remote insult within the right frontoparietal white matter (with associated wallerian degeneration extending inferiorly within the cortical spinal tract). No evidence of an intracranial  mass. No chronic intracranial blood products. No extra-axial fluid collection. No midline shift. Vascular: Maintained flow voids within the proximal large arterial vessels. Skull and upper cervical spine: No focal suspicious marrow lesion. Sinuses/Orbits: No mass or acute finding within the imaged orbits. Minimal mucosal thickening within the left ethmoid and bilateral sphenoid sinuses. Other: Small-volume fluid within the right mastoid air cells. MRA HEAD FINDINGS Mild to moderately motion degraded exam. Anterior circulation: ICA The M1 middle cerebral arteries are patent. No M2 proximal branch occlusion or high-grade proximal stenosis. The anterior cerebral arteries are patent. No intracranial aneurysm is identified. Posterior circulation: The intracranial vertebral arteries are patent. The right vertebral artery is developmentally diminutive beyond the PICA origin, but patent. The basilar artery is patent. The posterior cerebral arteries are patent. A right posterior communicating artery is present. The left posterior communicating artery is diminutive or absent. Anatomic variants: As described. MRA NECK FINDINGS Mild to moderately motion degraded  exam. Aortic arch: Standard aortic branching. The visualized aortic arch is normal in caliber. No hemodynamically significant innominate or proximal subclavian artery stenosis. Right carotid system: The common carotid and internal carotid arteries are patent within the neck. Apparent atherosclerotic narrowing within the proximal ICA of up to 70%. Motion artifact limits evaluation of the proximal CCA. Within this limitation, no other hemodynamically significant stenosis is identified. Left carotid system: CCA and ICA patent within the neck. Less than 50% atherosclerotic narrowing of the proximal ICA. Motion degradation limits evaluation of the proximal CCA. Within this limitation, no hemodynamically significant stenosis is identified elsewhere within the left carotid  system within the neck. Vertebral arteries: Vertebral arteries patent within the neck. Apparent high-grade stenoses within the proximal V1 segments bilaterally. Other: None IMPRESSION: MRI brain: 1. Intermittently motion degraded exam. 2. 2 mm acute/early subacute infarct within the left corona radiata. 3. 2 mm acute/early subacute infarct within the left basal ganglia. 4. Small nonspecific T2 FLAIR hyperintense remote insult within the right frontoparietal white matter. 5. Otherwise unremarkable non-contrast MRI appearance of the brain. 6. Small-volume fluid within the right mastoid air cells. MRA head: 1. Mild-to-moderately motion degraded exam. 2. No intracranial large vessel occlusion or proximal high-grade arterial stenosis identified. MRA neck: 1. Motion degraded exam. 2. The common carotid and internal carotid arteries are patent within the neck. There is apparent atherosclerotic narrowing of the proximal right ICA of up to 70%. However, it is possible that this stenosis is exaggerated by motion artifact on the current exam. Consider a CTA of the neck for further evaluation. 3. Vertebral arteries patent within the neck. There are apparent severe stenoses at the origins of the both vertebral arteries. However, these apparent stenoses could be exaggerated by motion artifact on the current exam. Consider a CTA of the neck for further evaluation. Electronically Signed   By: Jackey Loge D.O.   On: 08/02/2022 15:36   MR ANGIO NECK W WO CONTRAST  Result Date: 08/02/2022 CLINICAL DATA:  Provided history: Neuro deficit, acute, stroke suspected. Altered mental status. EXAM: MRI HEAD WITHOUT CONTRAST MRA HEAD WITHOUT CONTRAST MRA NECK WITHOUT AND WITH CONTRAST TECHNIQUE: Multiplanar, multi-echo pulse sequences of the brain and surrounding structures were acquired without intravenous contrast. Angiographic images of the Circle of Willis were acquired using MRA technique without intravenous contrast. Angiographic  images of the neck were acquired using MRA technique without and with intravenous contrast. Carotid stenosis measurements (when applicable) are obtained utilizing NASCET criteria, using the distal internal carotid diameter as the denominator. CONTRAST:  9mL GADAVIST GADOBUTROL 1 MMOL/ML IV SOLN COMPARISON:  Head CT 08/02/2022 FINDINGS: MRI HEAD FINDINGS Intermittently motion degraded examination (with up to moderate motion degradation of the acquired sequences). Brain: No age advanced or lobar predominant parenchymal atrophy. 2 mm focus of restricted diffusion within the left corona radiata consistent with an acute/early subacute infarct (series 5, image 35) (series 7, image 20). 2 mm focus of restricted diffusion within the left lentiform nucleus consistent with an acute/early subacute infarct (series 5, image 28) (series 7, image 25). Apparent tiny foci of diffusion-weighted signal abnormality within the right corona radiata appreciated on the axial diffusion-weighted sequence only, not appreciated on the coronal diffusion-weighted sequence, and likely artifactual. Small nonspecific T2 FLAIR hyperintense remote insult within the right frontoparietal white matter (with associated wallerian degeneration extending inferiorly within the cortical spinal tract). No evidence of an intracranial mass. No chronic intracranial blood products. No extra-axial fluid collection. No midline shift. Vascular: Maintained flow voids  within the proximal large arterial vessels. Skull and upper cervical spine: No focal suspicious marrow lesion. Sinuses/Orbits: No mass or acute finding within the imaged orbits. Minimal mucosal thickening within the left ethmoid and bilateral sphenoid sinuses. Other: Small-volume fluid within the right mastoid air cells. MRA HEAD FINDINGS Mild to moderately motion degraded exam. Anterior circulation: ICA The M1 middle cerebral arteries are patent. No M2 proximal branch occlusion or high-grade proximal  stenosis. The anterior cerebral arteries are patent. No intracranial aneurysm is identified. Posterior circulation: The intracranial vertebral arteries are patent. The right vertebral artery is developmentally diminutive beyond the PICA origin, but patent. The basilar artery is patent. The posterior cerebral arteries are patent. A right posterior communicating artery is present. The left posterior communicating artery is diminutive or absent. Anatomic variants: As described. MRA NECK FINDINGS Mild to moderately motion degraded exam. Aortic arch: Standard aortic branching. The visualized aortic arch is normal in caliber. No hemodynamically significant innominate or proximal subclavian artery stenosis. Right carotid system: The common carotid and internal carotid arteries are patent within the neck. Apparent atherosclerotic narrowing within the proximal ICA of up to 70%. Motion artifact limits evaluation of the proximal CCA. Within this limitation, no other hemodynamically significant stenosis is identified. Left carotid system: CCA and ICA patent within the neck. Less than 50% atherosclerotic narrowing of the proximal ICA. Motion degradation limits evaluation of the proximal CCA. Within this limitation, no hemodynamically significant stenosis is identified elsewhere within the left carotid system within the neck. Vertebral arteries: Vertebral arteries patent within the neck. Apparent high-grade stenoses within the proximal V1 segments bilaterally. Other: None IMPRESSION: MRI brain: 1. Intermittently motion degraded exam. 2. 2 mm acute/early subacute infarct within the left corona radiata. 3. 2 mm acute/early subacute infarct within the left basal ganglia. 4. Small nonspecific T2 FLAIR hyperintense remote insult within the right frontoparietal white matter. 5. Otherwise unremarkable non-contrast MRI appearance of the brain. 6. Small-volume fluid within the right mastoid air cells. MRA head: 1. Mild-to-moderately  motion degraded exam. 2. No intracranial large vessel occlusion or proximal high-grade arterial stenosis identified. MRA neck: 1. Motion degraded exam. 2. The common carotid and internal carotid arteries are patent within the neck. There is apparent atherosclerotic narrowing of the proximal right ICA of up to 70%. However, it is possible that this stenosis is exaggerated by motion artifact on the current exam. Consider a CTA of the neck for further evaluation. 3. Vertebral arteries patent within the neck. There are apparent severe stenoses at the origins of the both vertebral arteries. However, these apparent stenoses could be exaggerated by motion artifact on the current exam. Consider a CTA of the neck for further evaluation. Electronically Signed   By: Jackey Loge D.O.   On: 08/02/2022 15:36   CT HEAD WO CONTRAST ( )  Result Date: 08/02/2022 CLINICAL DATA:  Provided history: Neuro deficit, acute, stroke suspected. EXAM: CT HEAD WITHOUT CONTRAST TECHNIQUE: Contiguous axial images were obtained from the base of the skull through the vertex without intravenous contrast. RADIATION DOSE REDUCTION: This exam was performed according to the departmental dose-optimization program which includes automated exposure control, adjustment of the mA and/or kV according to patient size and/or use of iterative reconstruction technique. COMPARISON:  No pertinent prior exams available for comparison. FINDINGS: Brain: No age advanced or lobar predominant parenchymal atrophy. Mild patchy and ill-defined hypoattenuation within the cerebral white matter, nonspecific but most often secondary to chronic small vessel ischemia. Partially empty sella turcica. There is no acute intracranial  hemorrhage. No demarcated cortical infarct. No extra-axial fluid collection. No evidence of an intracranial mass. No midline shift. Vascular: No hyperdense vessel. Atherosclerotic calcifications. Skull: No fracture or aggressive osseous lesion.  Sinuses/Orbits: No mass or acute finding within the imaged orbits. Mild mucosal thickening within the bilateral sphenoid, bilateral ethmoid and left frontal sinuses. Other: Small-volume fluid within the right mastoid air cells. IMPRESSION: 1. No evidence of acute intracranial hemorrhage or acute infarct. 2. Mild cerebral white matter disease, nonspecific but most often secondary to chronic small vessel ischemia. 3. Partially empty sella turcica. This finding can reflect incidental anatomic variation, or alternatively, it can be associated with idiopathic intracranial hypertension (pseudotumor cerebri). 4. Mild paranasal sinus mucosal thickening. 5. Small-volume fluid within the right mastoid air cells. Electronically Signed   By: Jackey Loge D.O.   On: 08/02/2022 11:11    Labs on Admission: I have personally reviewed following labs  CBC: Recent Labs  Lab 08/02/22 1025  WBC 10.3  HGB 12.1  HCT 37.0  MCV 98.7  PLT 203   Basic Metabolic Panel: Recent Labs  Lab 08/02/22 1025  NA 132*  K 4.1  CL 103  CO2 21*  GLUCOSE 132*  BUN 15  CREATININE 1.36*  CALCIUM 8.7*   GFR: Estimated Creatinine Clearance: 47.6 mL/min (A) (by C-G formula based on SCr of 1.36 mg/dL (H)).  Urine analysis:    Component Value Date/Time   COLORURINE YELLOW 08/02/2022 1025   APPEARANCEUR CLEAR 08/02/2022 1025   LABSPEC >1.030 (H) 08/02/2022 1025   PHURINE 5.5 08/02/2022 1025   GLUCOSEU NEGATIVE 08/02/2022 1025   HGBUR MODERATE (A) 08/02/2022 1025   BILIRUBINUR NEGATIVE 08/02/2022 1025   BILIRUBINUR neg 08/19/2015 1201   KETONESUR NEGATIVE 08/02/2022 1025   PROTEINUR NEGATIVE 08/02/2022 1025   UROBILINOGEN negative 08/19/2015 1201   UROBILINOGEN 0.2 06/07/2014 1000   NITRITE NEGATIVE 08/02/2022 1025   LEUKOCYTESUR TRACE (A) 08/02/2022 1025   This document was prepared using Dragon Voice Recognition software and may include unintentional dictation errors.  Dr. Sedalia Muta [facility]  If 7PM-7AM,  please contact overnight-coverage provider If 7AM-7PM, please contact day attending provider [email or web address]  08/02/2022, 7:55 PM

## 2022-08-02 NOTE — Telephone Encounter (Signed)
Pt calling to make provider aware of her currently being at the ED at [facility] due to being confused, disoriented and off balance. Pt also states that she fell in the shower and hither head. Please advise

## 2022-08-02 NOTE — Assessment & Plan Note (Signed)
Left Corona radiata acute/early subacute infarct Left basal ganglia acute/early subacute infarct Neurology has been consulted via secure chat Complete echo has been ordered Fasting lipid panel Permissive hypertension: Hydralazine 5 mg IV every 8 hours as needed for SBP greater than 180 mmHg Frequent neuro vascular checks PT, OT Fall precaution

## 2022-08-02 NOTE — Assessment & Plan Note (Signed)
Resumed home rosuvastatin 10 mg daily Check fasting lipid panel in the a.m.

## 2022-08-02 NOTE — Assessment & Plan Note (Signed)
-   Hydralazine 5 mg IV every 8 hours as needed for SBP greater than 180, 4 days ordered 

## 2022-08-02 NOTE — ED Triage Notes (Signed)
Pt to ED [facility] from home for AMS noticed this morning when waking up 0715, LKW 0200 today. Pt states was trying to go to work and kept noticing she wasn't dressing appropriately. Pt states she could not remember how to get to work, has worked there 2013.  Pt giving conflicting information, states might have been falling a lot but not really sure. Also states she thinks she fell and hit head in bathroom. States bed is leaning and that makes her fall too.  Answers orientation questions appropriately.   +urinary sx.  Scheduled for open heart surgery on Monday.

## 2022-08-03 ENCOUNTER — Observation Stay (HOSPITAL_COMMUNITY)
Admit: 2022-08-03 | Discharge: 2022-08-03 | Disposition: A | Discharge status: Home / Self Care | Payer: Medicaid Other | Attending: Internal Medicine | Admitting: Internal Medicine

## 2022-08-03 ENCOUNTER — Observation Stay
Admit: 2022-08-03 | Discharge: 2022-08-03 | Disposition: A | Discharge status: Home / Self Care | Payer: Medicaid Other | Attending: Internal Medicine | Admitting: Internal Medicine

## 2022-08-03 ENCOUNTER — Telehealth: Payer: Self-pay | Admitting: Internal Medicine

## 2022-08-03 ENCOUNTER — Inpatient Hospital Stay: Payer: Medicaid Other

## 2022-08-03 DIAGNOSIS — I639 Cerebral infarction, unspecified: Secondary | ICD-10-CM | POA: Diagnosis present

## 2022-08-03 DIAGNOSIS — F909 Attention-deficit hyperactivity disorder, unspecified type: Secondary | ICD-10-CM | POA: Diagnosis present

## 2022-08-03 DIAGNOSIS — I63511 Cerebral infarction due to unspecified occlusion or stenosis of right middle cerebral artery: Secondary | ICD-10-CM | POA: Diagnosis not present

## 2022-08-03 DIAGNOSIS — I421 Obstructive hypertrophic cardiomyopathy: Secondary | ICD-10-CM

## 2022-08-03 DIAGNOSIS — F319 Bipolar disorder, unspecified: Secondary | ICD-10-CM | POA: Diagnosis present

## 2022-08-03 DIAGNOSIS — I10 Essential (primary) hypertension: Secondary | ICD-10-CM | POA: Diagnosis not present

## 2022-08-03 DIAGNOSIS — E7800 Pure hypercholesterolemia, unspecified: Secondary | ICD-10-CM | POA: Diagnosis not present

## 2022-08-03 DIAGNOSIS — Z87891 Personal history of nicotine dependence: Secondary | ICD-10-CM | POA: Diagnosis not present

## 2022-08-03 DIAGNOSIS — Z6836 Body mass index (BMI) 36.0-36.9, adult: Secondary | ICD-10-CM | POA: Diagnosis not present

## 2022-08-03 DIAGNOSIS — R42 Dizziness and giddiness: Secondary | ICD-10-CM | POA: Diagnosis present

## 2022-08-03 DIAGNOSIS — Z8261 Family history of arthritis: Secondary | ICD-10-CM | POA: Diagnosis not present

## 2022-08-03 DIAGNOSIS — Z8249 Family history of ischemic heart disease and other diseases of the circulatory system: Secondary | ICD-10-CM | POA: Diagnosis not present

## 2022-08-03 DIAGNOSIS — G4700 Insomnia, unspecified: Secondary | ICD-10-CM | POA: Diagnosis present

## 2022-08-03 DIAGNOSIS — I63411 Cerebral infarction due to embolism of right middle cerebral artery: Secondary | ICD-10-CM

## 2022-08-03 DIAGNOSIS — Z79899 Other long term (current) drug therapy: Secondary | ICD-10-CM | POA: Diagnosis not present

## 2022-08-03 DIAGNOSIS — Z888 Allergy status to other drugs, medicaments and biological substances status: Secondary | ICD-10-CM | POA: Diagnosis not present

## 2022-08-03 DIAGNOSIS — Z811 Family history of alcohol abuse and dependence: Secondary | ICD-10-CM | POA: Diagnosis not present

## 2022-08-03 DIAGNOSIS — Z823 Family history of stroke: Secondary | ICD-10-CM | POA: Diagnosis not present

## 2022-08-03 DIAGNOSIS — I129 Hypertensive chronic kidney disease with stage 1 through stage 4 chronic kidney disease, or unspecified chronic kidney disease: Secondary | ICD-10-CM | POA: Diagnosis present

## 2022-08-03 DIAGNOSIS — I6389 Other cerebral infarction: Secondary | ICD-10-CM

## 2022-08-03 DIAGNOSIS — Z833 Family history of diabetes mellitus: Secondary | ICD-10-CM | POA: Diagnosis not present

## 2022-08-03 DIAGNOSIS — Z818 Family history of other mental and behavioral disorders: Secondary | ICD-10-CM | POA: Diagnosis not present

## 2022-08-03 DIAGNOSIS — F3113 Bipolar disorder, current episode manic without psychotic features, severe: Secondary | ICD-10-CM

## 2022-08-03 DIAGNOSIS — E785 Hyperlipidemia, unspecified: Secondary | ICD-10-CM | POA: Diagnosis present

## 2022-08-03 DIAGNOSIS — Z83438 Family history of other disorder of lipoprotein metabolism and other lipidemia: Secondary | ICD-10-CM | POA: Diagnosis not present

## 2022-08-03 DIAGNOSIS — N1831 Chronic kidney disease, stage 3a: Secondary | ICD-10-CM | POA: Diagnosis present

## 2022-08-03 DIAGNOSIS — F4310 Post-traumatic stress disorder, unspecified: Secondary | ICD-10-CM | POA: Diagnosis present

## 2022-08-03 DIAGNOSIS — I63519 Cerebral infarction due to unspecified occlusion or stenosis of unspecified middle cerebral artery: Secondary | ICD-10-CM | POA: Diagnosis present

## 2022-08-03 DIAGNOSIS — Z809 Family history of malignant neoplasm, unspecified: Secondary | ICD-10-CM | POA: Diagnosis not present

## 2022-08-03 DIAGNOSIS — R29702 NIHSS score 2: Secondary | ICD-10-CM | POA: Diagnosis present

## 2022-08-03 DIAGNOSIS — Z886 Allergy status to analgesic agent status: Secondary | ICD-10-CM | POA: Diagnosis not present

## 2022-08-03 LAB — ECHOCARDIOGRAM COMPLETE
AR max vel: 2.51 cm2
AV Area VTI: 2.28 cm2
AV Area mean vel: 2.42 cm2
AV Mean grad: 9.5 mmHg
AV Peak grad: 20.9 mmHg
Ao pk vel: 2.29 m/s
Area-P 1/2: 1.72 cm2
Height: 62 in
MV VTI: 3.14 cm2
S' Lateral: 1.8 cm
Weight: 3168 oz

## 2022-08-03 LAB — LIPID PANEL
Cholesterol: 127 mg/dL (ref 0–200)
HDL: 30 mg/dL — ABNORMAL LOW (ref >40)
LDL Cholesterol: 58 mg/dL (ref 0–99)
Total CHOL/HDL Ratio: 4.2 RATIO
Triglycerides: 194 mg/dL — ABNORMAL HIGH (ref <150)
VLDL: 39 mg/dL (ref 0–40)

## 2022-08-03 LAB — HIV ANTIBODY (ROUTINE TESTING W REFLEX): HIV Screen 4th Generation wRfx: NONREACTIVE

## 2022-08-03 MED ORDER — IOHEXOL 350 MG/ML SOLN
75.0000 mL | Freq: Once | INTRAVENOUS | Status: AC | PRN
  Administered 2022-08-03: 75 mL via INTRAVENOUS

## 2022-08-03 MED ORDER — OLANZAPINE 5 MG PO TABS
5.0000 mg | ORAL_TABLET | Freq: Every day | ORAL | Status: DC
  Administered 2022-08-03: 5 mg via ORAL
  Filled 2022-08-03: qty 1

## 2022-08-03 MED ORDER — ENOXAPARIN SODIUM 60 MG/0.6ML IJ SOSY
0.5000 mg/kg | PREFILLED_SYRINGE | INTRAMUSCULAR | Status: DC
  Administered 2022-08-03: 45 mg via SUBCUTANEOUS
  Filled 2022-08-03: qty 0.6

## 2022-08-03 NOTE — Telephone Encounter (Signed)
Pt sister called in to see if Dr. Izora Ribas would fill out FMLA paperwork. Advised that if pt has surgery next week at [facility] that office would fill out FMLA paperwork.  If surgery is postponed pt should reach out to neurology to have paperwork filled out.  Cardiology is not the reason for pt hospitalization so we wouldn't be the best office to fill out paperwork. Advised to call back if she has further questions or concerns.

## 2022-08-03 NOTE — Telephone Encounter (Signed)
Patient is returning call and is requesting call back.  

## 2022-08-03 NOTE — Progress Notes (Signed)
PROGRESS NOTE    SEMIAH SISTARE  ZOX:[id number] DOB: 1965-04-01 DOA: 08/02/2022 PCP: Pincus Sanes, MD    Brief Narrative:  57 year old with history of bipolar disorder, ADHD, chronic dizziness, hyperlipidemia, insomnia and HOCM who is a scheduled to have cardiac surgery next Monday who was found confused while at work and coworkers called EMS and sent her to ER.  Patient apparently showed up in the meeting with minimal clothes on her body and not knowing what is going on.  By the time she came to emergency room she was alert awake and oriented.  She had no neurological deficits.  She was hemodynamically stable.  Serologically normal with normal renal functions and normal LFTs.  Urinalysis was normal.  Patient does have history of poor balance, shortness of breath.  Recently quit his smoking.  Admitted to the hospital with altered mental status and found to have acute to stroke.   Assessment & Plan:   Left MCA territory ischemic stroke: Clinical findings, confusion and amnesia CT head findings, no acute findings.  Mild cerebral white matter disease. MRI of the brain, 2 mm acute or subacute infarct left corona radiator, left basal ganglia MR angiogram of the head and neck, no large vessel occlusion.  No contractions or stenosis.  MRSA neck with patent arteries. 2D echocardiogram, pending today.  Patient has known history of severe basal hypertrophy with septal thickness of 2.3 cm. Antiplatelet therapy, patient is not on any antiplatelet therapy at home.  Loaded with aspirin. LDL 58.  On a statin at home.  At goal. Hemoglobin A1c, 5.83 months ago.  Nondiabetic. DVT prophylaxis, Lovenox. Therapy recommendations, pending.  Likely home with therapies. She quit smoking recently. Work with PT OT.  Neurology to consult on the patient. With minor TIA, may need to reconsider timing for her septal surgery.  Will discuss with neurology. Possibly home with treatment.  CKD stage IIIa: Baseline  creatinine about 1.3-1.5.  Monitor closely on treatment.  HOCM: Patient on Disopyramide 100 mg 4 times daily.  Continued.  She is scheduled to have surgery next week.  Medical issues including Hypertension, stable Hyperlipidemia, on Crestor 10 mg. Bipolar disorder, she is on olanzapine that will be continued.    DVT prophylaxis: SCD's Start: 08/02/22 1644   Code Status: Full code Family Communication: None at the bedside Disposition Plan: Status is: Observation The patient will require care spanning > 2 midnights and should be moved to inpatient because: Stroke workup     Consultants:  Neurology  Procedures:  None  Antimicrobials:  None   Subjective: Patient seen and examined in the morning rounds.  She was able to get out of the bed.  Patient tells me that she always have weakness and shortness of breath due to her heart condition.  Denies any focal weakness.  Denies any trouble swallowing. Patient is more worried about her upcoming surgery.  Objective: Vitals:   08/02/22 2048 08/03/22 0000 08/03/22 0358 08/03/22 0757  BP: (!) 152/100 (!) 142/84 (!) 171/102 (!) 149/89  Pulse: 76 76 90 81  Resp: 20 18 20 18   Temp: 98.2 F (36.8 C) 98.4 F (36.9 C) 98.3 F (36.8 C) 98.4 F (36.9 C)  TempSrc: Oral Oral Oral Oral  SpO2: 99% 100% 96% 96%  Weight:      Height:        Intake/Output Summary (Last 24 hours) at 08/03/2022 1125 Last data filed at 08/03/2022 1047 Gross per 24 hour  Intake 120 ml  Output --  Net 120 ml   Filed Weights   08/02/22 1020  Weight: 89.8 kg    Examination:  General exam: Appears calm and comfortable.  Pleasant to conversation.  She is alert awake and oriented.  There is no focal neurological deficits on exam. Respiratory system: Clear to auscultation. Respiratory effort normal. Cardiovascular system: S1 & S2 heard, RRR. No pedal edema. Gastrointestinal system: Soft.  Nontender.  Obese and pendulous.  Bowel sound present.   Central  nervous system: Alert and oriented. No focal neurological deficits. Extremities: Symmetric 5 x 5 power.     Data Reviewed: I have personally reviewed following labs and imaging studies  CBC: Recent Labs  Lab 08/02/22 1025  WBC 10.3  HGB 12.1  HCT 37.0  MCV 98.7  PLT 203   Basic Metabolic Panel: Recent Labs  Lab 08/02/22 1025  NA 132*  K 4.1  CL 103  CO2 21*  GLUCOSE 132*  BUN 15  CREATININE 1.36*  CALCIUM 8.7*   GFR: Estimated Creatinine Clearance: 47.6 mL/min (A) (by C-G formula based on SCr of 1.36 mg/dL (H)). Liver Function Tests: No results for input(s): "AST", "ALT", "ALKPHOS", "BILITOT", "PROT", "ALBUMIN" in the last 168 hours. No results for input(s): "LIPASE", "AMYLASE" in the last 168 hours. No results for input(s): "AMMONIA" in the last 168 hours. Coagulation Profile: No results for input(s): "INR", "PROTIME" in the last 168 hours. Cardiac Enzymes: No results for input(s): "CKTOTAL", "CKMB", "CKMBINDEX", "TROPONINI" in the last 168 hours. BNP (last 3 results) No results for input(s): "PROBNP" in the last 8760 hours. HbA1C: No results for input(s): "HGBA1C" in the last 72 hours. CBG: No results for input(s): "GLUCAP" in the last 168 hours. Lipid Profile: Recent Labs    08/03/22 0352  CHOL 127  HDL 30*  LDLCALC 58  TRIG 161*  CHOLHDL 4.2   Thyroid Function Tests: No results for input(s): "TSH", "T4TOTAL", "FREET4", "T3FREE", "THYROIDAB" in the last 72 hours. Anemia Panel: No results for input(s): "VITAMINB12", "FOLATE", "FERRITIN", "TIBC", "IRON", "RETICCTPCT" in the last 72 hours. Sepsis Labs: No results for input(s): "PROCALCITON", "LATICACIDVEN" in the last 168 hours.  No results found for this or any previous visit (from the past 240 hour(s)).       Radiology Studies: MR BRAIN WO CONTRAST  Result Date: 08/02/2022 CLINICAL DATA:  Provided history: Neuro deficit, acute, stroke suspected. Altered mental status. EXAM: MRI HEAD WITHOUT  CONTRAST MRA HEAD WITHOUT CONTRAST MRA NECK WITHOUT AND WITH CONTRAST TECHNIQUE: Multiplanar, multi-echo pulse sequences of the brain and surrounding structures were acquired without intravenous contrast. Angiographic images of the Circle of Willis were acquired using MRA technique without intravenous contrast. Angiographic images of the neck were acquired using MRA technique without and with intravenous contrast. Carotid stenosis measurements (when applicable) are obtained utilizing NASCET criteria, using the distal internal carotid diameter as the denominator. CONTRAST:  9mL GADAVIST GADOBUTROL 1 MMOL/ML IV SOLN COMPARISON:  Head CT 08/02/2022 FINDINGS: MRI HEAD FINDINGS Intermittently motion degraded examination (with up to moderate motion degradation of the acquired sequences). Brain: No age advanced or lobar predominant parenchymal atrophy. 2 mm focus of restricted diffusion within the left corona radiata consistent with an acute/early subacute infarct (series 5, image 35) (series 7, image 20). 2 mm focus of restricted diffusion within the left lentiform nucleus consistent with an acute/early subacute infarct (series 5, image 28) (series 7, image 25). Apparent tiny foci of diffusion-weighted signal abnormality within the right corona radiata appreciated on the axial diffusion-weighted sequence only, not  appreciated on the coronal diffusion-weighted sequence, and likely artifactual. Small nonspecific T2 FLAIR hyperintense remote insult within the right frontoparietal white matter (with associated wallerian degeneration extending inferiorly within the cortical spinal tract). No evidence of an intracranial mass. No chronic intracranial blood products. No extra-axial fluid collection. No midline shift. Vascular: Maintained flow voids within the proximal large arterial vessels. Skull and upper cervical spine: No focal suspicious marrow lesion. Sinuses/Orbits: No mass or acute finding within the imaged orbits.  Minimal mucosal thickening within the left ethmoid and bilateral sphenoid sinuses. Other: Small-volume fluid within the right mastoid air cells. MRA HEAD FINDINGS Mild to moderately motion degraded exam. Anterior circulation: ICA The M1 middle cerebral arteries are patent. No M2 proximal branch occlusion or high-grade proximal stenosis. The anterior cerebral arteries are patent. No intracranial aneurysm is identified. Posterior circulation: The intracranial vertebral arteries are patent. The right vertebral artery is developmentally diminutive beyond the PICA origin, but patent. The basilar artery is patent. The posterior cerebral arteries are patent. A right posterior communicating artery is present. The left posterior communicating artery is diminutive or absent. Anatomic variants: As described. MRA NECK FINDINGS Mild to moderately motion degraded exam. Aortic arch: Standard aortic branching. The visualized aortic arch is normal in caliber. No hemodynamically significant innominate or proximal subclavian artery stenosis. Right carotid system: The common carotid and internal carotid arteries are patent within the neck. Apparent atherosclerotic narrowing within the proximal ICA of up to 70%. Motion artifact limits evaluation of the proximal CCA. Within this limitation, no other hemodynamically significant stenosis is identified. Left carotid system: CCA and ICA patent within the neck. Less than 50% atherosclerotic narrowing of the proximal ICA. Motion degradation limits evaluation of the proximal CCA. Within this limitation, no hemodynamically significant stenosis is identified elsewhere within the left carotid system within the neck. Vertebral arteries: Vertebral arteries patent within the neck. Apparent high-grade stenoses within the proximal V1 segments bilaterally. Other: None IMPRESSION: MRI brain: 1. Intermittently motion degraded exam. 2. 2 mm acute/early subacute infarct within the left corona radiata. 3. 2  mm acute/early subacute infarct within the left basal ganglia. 4. Small nonspecific T2 FLAIR hyperintense remote insult within the right frontoparietal white matter. 5. Otherwise unremarkable non-contrast MRI appearance of the brain. 6. Small-volume fluid within the right mastoid air cells. MRA head: 1. Mild-to-moderately motion degraded exam. 2. No intracranial large vessel occlusion or proximal high-grade arterial stenosis identified. MRA neck: 1. Motion degraded exam. 2. The common carotid and internal carotid arteries are patent within the neck. There is apparent atherosclerotic narrowing of the proximal right ICA of up to 70%. However, it is possible that this stenosis is exaggerated by motion artifact on the current exam. Consider a CTA of the neck for further evaluation. 3. Vertebral arteries patent within the neck. There are apparent severe stenoses at the origins of the both vertebral arteries. However, these apparent stenoses could be exaggerated by motion artifact on the current exam. Consider a CTA of the neck for further evaluation. Electronically Signed   By: Jackey Loge D.O.   On: 08/02/2022 15:36   MR ANGIO HEAD WO CONTRAST  Result Date: 08/02/2022 CLINICAL DATA:  Provided history: Neuro deficit, acute, stroke suspected. Altered mental status. EXAM: MRI HEAD WITHOUT CONTRAST MRA HEAD WITHOUT CONTRAST MRA NECK WITHOUT AND WITH CONTRAST TECHNIQUE: Multiplanar, multi-echo pulse sequences of the brain and surrounding structures were acquired without intravenous contrast. Angiographic images of the Circle of Willis were acquired using MRA technique without intravenous contrast. Angiographic  images of the neck were acquired using MRA technique without and with intravenous contrast. Carotid stenosis measurements (when applicable) are obtained utilizing NASCET criteria, using the distal internal carotid diameter as the denominator. CONTRAST:  9mL GADAVIST GADOBUTROL 1 MMOL/ML IV SOLN COMPARISON:  Head  CT 08/02/2022 FINDINGS: MRI HEAD FINDINGS Intermittently motion degraded examination (with up to moderate motion degradation of the acquired sequences). Brain: No age advanced or lobar predominant parenchymal atrophy. 2 mm focus of restricted diffusion within the left corona radiata consistent with an acute/early subacute infarct (series 5, image 35) (series 7, image 20). 2 mm focus of restricted diffusion within the left lentiform nucleus consistent with an acute/early subacute infarct (series 5, image 28) (series 7, image 25). Apparent tiny foci of diffusion-weighted signal abnormality within the right corona radiata appreciated on the axial diffusion-weighted sequence only, not appreciated on the coronal diffusion-weighted sequence, and likely artifactual. Small nonspecific T2 FLAIR hyperintense remote insult within the right frontoparietal white matter (with associated wallerian degeneration extending inferiorly within the cortical spinal tract). No evidence of an intracranial mass. No chronic intracranial blood products. No extra-axial fluid collection. No midline shift. Vascular: Maintained flow voids within the proximal large arterial vessels. Skull and upper cervical spine: No focal suspicious marrow lesion. Sinuses/Orbits: No mass or acute finding within the imaged orbits. Minimal mucosal thickening within the left ethmoid and bilateral sphenoid sinuses. Other: Small-volume fluid within the right mastoid air cells. MRA HEAD FINDINGS Mild to moderately motion degraded exam. Anterior circulation: ICA The M1 middle cerebral arteries are patent. No M2 proximal branch occlusion or high-grade proximal stenosis. The anterior cerebral arteries are patent. No intracranial aneurysm is identified. Posterior circulation: The intracranial vertebral arteries are patent. The right vertebral artery is developmentally diminutive beyond the PICA origin, but patent. The basilar artery is patent. The posterior cerebral  arteries are patent. A right posterior communicating artery is present. The left posterior communicating artery is diminutive or absent. Anatomic variants: As described. MRA NECK FINDINGS Mild to moderately motion degraded exam. Aortic arch: Standard aortic branching. The visualized aortic arch is normal in caliber. No hemodynamically significant innominate or proximal subclavian artery stenosis. Right carotid system: The common carotid and internal carotid arteries are patent within the neck. Apparent atherosclerotic narrowing within the proximal ICA of up to 70%. Motion artifact limits evaluation of the proximal CCA. Within this limitation, no other hemodynamically significant stenosis is identified. Left carotid system: CCA and ICA patent within the neck. Less than 50% atherosclerotic narrowing of the proximal ICA. Motion degradation limits evaluation of the proximal CCA. Within this limitation, no hemodynamically significant stenosis is identified elsewhere within the left carotid system within the neck. Vertebral arteries: Vertebral arteries patent within the neck. Apparent high-grade stenoses within the proximal V1 segments bilaterally. Other: None IMPRESSION: MRI brain: 1. Intermittently motion degraded exam. 2. 2 mm acute/early subacute infarct within the left corona radiata. 3. 2 mm acute/early subacute infarct within the left basal ganglia. 4. Small nonspecific T2 FLAIR hyperintense remote insult within the right frontoparietal white matter. 5. Otherwise unremarkable non-contrast MRI appearance of the brain. 6. Small-volume fluid within the right mastoid air cells. MRA head: 1. Mild-to-moderately motion degraded exam. 2. No intracranial large vessel occlusion or proximal high-grade arterial stenosis identified. MRA neck: 1. Motion degraded exam. 2. The common carotid and internal carotid arteries are patent within the neck. There is apparent atherosclerotic narrowing of the proximal right ICA of up to 70%.  However, it is possible that this stenosis is  exaggerated by motion artifact on the current exam. Consider a CTA of the neck for further evaluation. 3. Vertebral arteries patent within the neck. There are apparent severe stenoses at the origins of the both vertebral arteries. However, these apparent stenoses could be exaggerated by motion artifact on the current exam. Consider a CTA of the neck for further evaluation. Electronically Signed   By: Jackey Loge D.O.   On: 08/02/2022 15:36   MR ANGIO NECK W WO CONTRAST  Result Date: 08/02/2022 CLINICAL DATA:  Provided history: Neuro deficit, acute, stroke suspected. Altered mental status. EXAM: MRI HEAD WITHOUT CONTRAST MRA HEAD WITHOUT CONTRAST MRA NECK WITHOUT AND WITH CONTRAST TECHNIQUE: Multiplanar, multi-echo pulse sequences of the brain and surrounding structures were acquired without intravenous contrast. Angiographic images of the Circle of Willis were acquired using MRA technique without intravenous contrast. Angiographic images of the neck were acquired using MRA technique without and with intravenous contrast. Carotid stenosis measurements (when applicable) are obtained utilizing NASCET criteria, using the distal internal carotid diameter as the denominator. CONTRAST:  9mL GADAVIST GADOBUTROL 1 MMOL/ML IV SOLN COMPARISON:  Head CT 08/02/2022 FINDINGS: MRI HEAD FINDINGS Intermittently motion degraded examination (with up to moderate motion degradation of the acquired sequences). Brain: No age advanced or lobar predominant parenchymal atrophy. 2 mm focus of restricted diffusion within the left corona radiata consistent with an acute/early subacute infarct (series 5, image 35) (series 7, image 20). 2 mm focus of restricted diffusion within the left lentiform nucleus consistent with an acute/early subacute infarct (series 5, image 28) (series 7, image 25). Apparent tiny foci of diffusion-weighted signal abnormality within the right corona radiata appreciated  on the axial diffusion-weighted sequence only, not appreciated on the coronal diffusion-weighted sequence, and likely artifactual. Small nonspecific T2 FLAIR hyperintense remote insult within the right frontoparietal white matter (with associated wallerian degeneration extending inferiorly within the cortical spinal tract). No evidence of an intracranial mass. No chronic intracranial blood products. No extra-axial fluid collection. No midline shift. Vascular: Maintained flow voids within the proximal large arterial vessels. Skull and upper cervical spine: No focal suspicious marrow lesion. Sinuses/Orbits: No mass or acute finding within the imaged orbits. Minimal mucosal thickening within the left ethmoid and bilateral sphenoid sinuses. Other: Small-volume fluid within the right mastoid air cells. MRA HEAD FINDINGS Mild to moderately motion degraded exam. Anterior circulation: ICA The M1 middle cerebral arteries are patent. No M2 proximal branch occlusion or high-grade proximal stenosis. The anterior cerebral arteries are patent. No intracranial aneurysm is identified. Posterior circulation: The intracranial vertebral arteries are patent. The right vertebral artery is developmentally diminutive beyond the PICA origin, but patent. The basilar artery is patent. The posterior cerebral arteries are patent. A right posterior communicating artery is present. The left posterior communicating artery is diminutive or absent. Anatomic variants: As described. MRA NECK FINDINGS Mild to moderately motion degraded exam. Aortic arch: Standard aortic branching. The visualized aortic arch is normal in caliber. No hemodynamically significant innominate or proximal subclavian artery stenosis. Right carotid system: The common carotid and internal carotid arteries are patent within the neck. Apparent atherosclerotic narrowing within the proximal ICA of up to 70%. Motion artifact limits evaluation of the proximal CCA. Within this  limitation, no other hemodynamically significant stenosis is identified. Left carotid system: CCA and ICA patent within the neck. Less than 50% atherosclerotic narrowing of the proximal ICA. Motion degradation limits evaluation of the proximal CCA. Within this limitation, no hemodynamically significant stenosis is identified elsewhere within the left carotid  system within the neck. Vertebral arteries: Vertebral arteries patent within the neck. Apparent high-grade stenoses within the proximal V1 segments bilaterally. Other: None IMPRESSION: MRI brain: 1. Intermittently motion degraded exam. 2. 2 mm acute/early subacute infarct within the left corona radiata. 3. 2 mm acute/early subacute infarct within the left basal ganglia. 4. Small nonspecific T2 FLAIR hyperintense remote insult within the right frontoparietal white matter. 5. Otherwise unremarkable non-contrast MRI appearance of the brain. 6. Small-volume fluid within the right mastoid air cells. MRA head: 1. Mild-to-moderately motion degraded exam. 2. No intracranial large vessel occlusion or proximal high-grade arterial stenosis identified. MRA neck: 1. Motion degraded exam. 2. The common carotid and internal carotid arteries are patent within the neck. There is apparent atherosclerotic narrowing of the proximal right ICA of up to 70%. However, it is possible that this stenosis is exaggerated by motion artifact on the current exam. Consider a CTA of the neck for further evaluation. 3. Vertebral arteries patent within the neck. There are apparent severe stenoses at the origins of the both vertebral arteries. However, these apparent stenoses could be exaggerated by motion artifact on the current exam. Consider a CTA of the neck for further evaluation. Electronically Signed   By: Jackey Loge D.O.   On: 08/02/2022 15:36   CT HEAD WO CONTRAST ( )  Result Date: 08/02/2022 CLINICAL DATA:  Provided history: Neuro deficit, acute, stroke suspected. EXAM: CT HEAD  WITHOUT CONTRAST TECHNIQUE: Contiguous axial images were obtained from the base of the skull through the vertex without intravenous contrast. RADIATION DOSE REDUCTION: This exam was performed according to the departmental dose-optimization program which includes automated exposure control, adjustment of the mA and/or kV according to patient size and/or use of iterative reconstruction technique. COMPARISON:  No pertinent prior exams available for comparison. FINDINGS: Brain: No age advanced or lobar predominant parenchymal atrophy. Mild patchy and ill-defined hypoattenuation within the cerebral white matter, nonspecific but most often secondary to chronic small vessel ischemia. Partially empty sella turcica. There is no acute intracranial hemorrhage. No demarcated cortical infarct. No extra-axial fluid collection. No evidence of an intracranial mass. No midline shift. Vascular: No hyperdense vessel. Atherosclerotic calcifications. Skull: No fracture or aggressive osseous lesion. Sinuses/Orbits: No mass or acute finding within the imaged orbits. Mild mucosal thickening within the bilateral sphenoid, bilateral ethmoid and left frontal sinuses. Other: Small-volume fluid within the right mastoid air cells. IMPRESSION: 1. No evidence of acute intracranial hemorrhage or acute infarct. 2. Mild cerebral white matter disease, nonspecific but most often secondary to chronic small vessel ischemia. 3. Partially empty sella turcica. This finding can reflect incidental anatomic variation, or alternatively, it can be associated with idiopathic intracranial hypertension (pseudotumor cerebri). 4. Mild paranasal sinus mucosal thickening. 5. Small-volume fluid within the right mastoid air cells. Electronically Signed   By: Jackey Loge D.O.   On: 08/02/2022 11:11        Scheduled Meds:  amphetamine-dextroamphetamine  20 mg Oral q morning   disopyramide  100 mg Oral QID   enoxaparin (LOVENOX) injection  0.5 mg/kg Subcutaneous  Q24H   melatonin  10 mg Oral QHS   OLANZapine-Samidorphan  1 tablet Oral Daily   rosuvastatin  10 mg Oral Daily   Continuous Infusions:   LOS: 0 days    Time spent: 45 minutes    Dorcas Carrow, MD [facility] Pager [phone]

## 2022-08-03 NOTE — Progress Notes (Signed)
*  PRELIMINARY RESULTS* Echocardiogram 2D Echocardiogram has been performed.  Carolyne Fiscal 08/03/2022, 1:08 PM

## 2022-08-03 NOTE — Evaluation (Signed)
Physical Therapy Evaluation Patient Details Name: Kylie Wright MRN: [id number] DOB: [birth year] Today's Date: 08/03/2022  History of Present Illness  57 year old female with history of bipolar disorder, ADHD, chronic dizziness, hyperlipidemia, insomnia, who presents to the emergency department for chief concerns of strokelike symptoms.  Defecits primarily confusion and inability to organize rote activities.  Clinical Impression  Pt showed good effort with PT exam and subsequent mobility acts but did need consistent cuing and redirection to stay on task.  Pt reports feeling much improved in this regard since precipitating confusion but unsure of true baseline.  She displayed equal strength and coordination in b/l U&LEs but did have some general unsteadiness and guarded posturing during ambulation/mobility/steps.  While she did not have any overt LOBs PT suggesting RW for increased stability/confidence at this time.  Pt apparently to have open heart surgery at [facility] within the week.  Pt will benefit from continued PT to address functional and safety limitations.       Recommendations for follow up therapy are one component of a multi-disciplinary discharge planning process, led by the attending physician.  Recommendations may be updated based on patient status, additional functional criteria and insurance authorization.  Follow Up Recommendations       Assistance Recommended at Discharge Intermittent Supervision/Assistance  Patient can return home with the following  Assist for transportation;Assistance with cooking/housework;Direct supervision/assist for medications management    Equipment Recommendations Rolling walker (2 wheels)  Recommendations for Other Services       Functional Status Assessment Patient has had a recent decline in their functional status and demonstrates the ability to make significant improvements in function in a reasonable and predictable amount of time.      Precautions / Restrictions Precautions Precautions: Fall Restrictions Weight Bearing Restrictions: No      Mobility  Bed Mobility Overal bed mobility: Independent             General bed mobility comments: Pt easily able to transtion supine<>sit w/o assist    Transfers Overall transfer level: Modified independent Equipment used: None               General transfer comment: Pt was able to rise to standing with appropriate UE use, no need for direct physical assist.    Ambulation/Gait Ambulation/Gait assistance: Min guard Gait Distance (Feet): 140 Feet Assistive device: None, 1 person hand held assist         General Gait Details: Pt displayed guarded posture t/o the ambulation effort with need for occasional reaching out to hallway rails/walls and/or counter tops t/o the effort.  She had no overt LOBs but noted unsteadiness and general hesitancy t/o the effort.  Stairs Stairs: Yes Stairs assistance: Min guard Stair Management: One rail Right, Forwards Number of Stairs: 6 General stair comments: Pt was able to negotiate up/down steps with step-to strategy using single rai.  She did not have any overt safety issues, but continues to display some hesitancy and  reports general feeling of unsteadiness,  Wheelchair Mobility    Modified Rankin (Stroke Patients Only)       Balance Overall balance assessment: Needs assistance Sitting-balance support: No upper extremity supported Sitting balance-Leahy Scale: Good Sitting balance - Comments: able to safely maintain sitting balance w/o UEs   Standing balance support: No upper extremity supported Standing balance-Leahy Scale: Fair Standing balance comment: Pt able to maintain static standing with eyes closed, appropriate righting responses with mod perturbations but displaying general guarded posture and mild unsteadiness during ambulation/functional  tasks                             Pertinent  Vitals/Pain Pain Assessment Pain Assessment: No/denies pain    Home Living Family/patient expects to be discharged to:: Private residence Living Arrangements: Children Available Help at Discharge: Available PRN/intermittently (23 y/o son works night shift)   Home Access: Stairs to enter Entrance Stairs-Rails: Doctor, general practice of Steps: 4   Home Layout: One level Home Equipment: None      Prior Function Prior Level of Function : Independent/Modified Independent;Working/employed;Driving             Mobility Comments: Pt reports recent decreases in activity tolerance and generally feeling less steady.  Reports some recent falls, consistently out of bed recently... ADLs Comments: Pt reports being able to manage as needed     Hand Dominance        Extremity/Trunk Assessment   Upper Extremity Assessment Upper Extremity Assessment: Overall WFL for tasks assessed (= b/l w/o focal defecits)    Lower Extremity Assessment Lower Extremity Assessment: Overall WFL for tasks assessed (= b/l w/o focal defecits)       Communication   Communication: No difficulties  Cognition Arousal/Alertness: Awake/alert Behavior During Therapy:  (easily distracted, needing cuing to stay on task) Overall Cognitive Status: No family/caregiver present to determine baseline cognitive functioning                                 General Comments: precipitating event was confusion driven, she reports feeling this has improved but that she still does not feel back to her baseline.  Pt able to hold conversation and showed good situational awarness, but did need consistent redirection and repeated cues to perform some basic tasks.        General Comments General comments (skin integrity, edema, etc.): Pt does not appear to have focal strength, sensation, coordination defecits but does report feeling that she is not at her baseline, displays easy distraction and need for  repeated cues/re-directional t/o session - unsure how this compares to baseline.    Exercises     Assessment/Plan    PT Assessment Patient needs continued PT services  PT Problem List Decreased safety awareness;Decreased knowledge of use of DME;Decreased cognition;Decreased activity tolerance;Decreased balance       PT Treatment Interventions DME instruction;Gait training;Stair training;Functional mobility training;Therapeutic activities;Therapeutic exercise;Balance training;Cognitive remediation;Patient/family education    PT Goals (Current goals can be found in the Care Plan section)  Acute Rehab PT Goals Patient Stated Goal: hoping not to cancel her planned heart surgery next week PT Goal Formulation: With patient Time For Goal Achievement: 08/16/22 Potential to Achieve Goals: Good    Frequency Min 4X/week     Co-evaluation               AM-PAC PT "6 Clicks" Mobility  Outcome Measure Help needed turning from your back to your side while in a flat bed without using bedrails?: None Help needed moving from lying on your back to sitting on the side of a flat bed without using bedrails?: None Help needed moving to and from a bed to a chair (including a wheelchair)?: None Help needed standing up from a chair using your arms (e.g., wheelchair or bedside chair)?: None Help needed to walk in hospital room?: A Little Help needed climbing 3-5 steps with a railing? :  A Little 6 Click Score: 22    End of Session Equipment Utilized During Treatment: Gait belt Activity Tolerance: Patient tolerated treatment well;Patient limited by fatigue Patient left: with bed alarm set;with call bell/phone within reach Nurse Communication: Mobility status PT Visit Diagnosis: Unsteadiness on feet (R26.81);History of falling (Z91.81);Other symptoms and signs involving the nervous system (R29.898)    Time: 0822-0850 PT Time Calculation (min) (ACUTE ONLY): 28 min   Charges:   PT  Evaluation $PT Eval Low Complexity: 1 Low PT Treatments $Therapeutic Activity: 8-22 mins        Malachi Pro, DPT 08/03/2022, 11:38 AM

## 2022-08-03 NOTE — Telephone Encounter (Signed)
Called pt sister Beecher Mcardle ok per DPR.  Reports pt is currently in patient.  Had a stroke.  Pt is scheduled to have surgery on Monday for HCM at [facility].  Wants to know how to proceed.  Advised I will send concern to MD to advise.   Reports pt fell in tub over the weekend and hit her head.  On Tuesday went to work underdressed; only wearing tank top.  Was sent to the ED from work.

## 2022-08-03 NOTE — Progress Notes (Signed)
PHARMACIST - PHYSICIAN COMMUNICATION  CONCERNING:  Enoxaparin (Lovenox) for DVT Prophylaxis    RECOMMENDATION: Patient was prescribed enoxaparin 40mg  q24 hours for VTE prophylaxis.   Filed Weights   08/02/22 1020  Weight: 89.8 kg (198 lb)    Body mass index is 36.21 kg/m.  Estimated Creatinine Clearance: 47.6 mL/min (A) (by C-G formula based on SCr of 1.36 mg/dL (H)).   Based on [facility] policy patient is candidate for enoxaparin 0.5mg /kg TBW SQ every 24 hours based on BMI being >30.  DESCRIPTION: Pharmacy has adjusted enoxaparin dose per [facility] policy.  Patient is now receiving enoxaparin 0.5 mg/kg every 24 hours    Will M. Dareen Piano, PharmD PGY-1 Pharmacy Resident 08/03/2022 12:33 PM

## 2022-08-03 NOTE — Evaluation (Signed)
Occupational Therapy Evaluation Patient Details Name: Kylie Wright MRN: [id number] DOB: 17-Apr-1965 Today's Date: 08/03/2022   History of Present Illness 57 year old female with history of bipolar disorder, ADHD, chronic dizziness, hyperlipidemia, insomnia, who presents to the emergency department for chief concerns of strokelike symptoms.  Defecits primarily confusion and inability to organize rote activities.   Clinical Impression   Patient presenting with impaired cognition, most noted in sustained attention and is easily distracted throughout OT evaluation. Requires cuing and redirection to stay off task, often perseverating on recent events. Pt reports dyslexia at baseline, which she says causes her to "have a hard time understanding". No focal neurological deficits noted. Pt with hx of dizziness due to cardiac condition as self-reported (has open heart surgery scheduled). Patient lives with adult son, drives, manages meds + money, working in the mental health PTA. She does endorse frequent fall history, falling once in bathroom d/t dizziness. Short Blessed Test Score 5/28, below normal limits. Pt required directions repeated throughout assessment with verbal and visual demonstration. Overall, supervision/setup with vcs for efficient task performance, good functional mobility overall but with general unsteadiness. Patient currently functioning below baseline with executive functioning delays. Patient will benefit from acute OT to increase overall independence in the areas of ADLs, functional mobility, safety awareness, efficient task performance in order to safely discharge.      Recommendations for follow up therapy are one component of a multi-disciplinary discharge planning process, led by the attending physician.  Recommendations may be updated based on patient status, additional functional criteria and insurance authorization.   Assistance Recommended at Discharge Intermittent  Supervision/Assistance  Patient can return home with the following Other (comment);Direct supervision/assist for financial management;Direct supervision/assist for medications management;A little help with walking and/or transfers;A little help with bathing/dressing/bathroom (Recommending medical clearance prior to driving)    Functional Status Assessment  Patient has had a recent decline in their functional status and demonstrates the ability to make significant improvements in function in a reasonable and predictable amount of time.  Equipment Recommendations  Tub/shower bench    Recommendations for Other Services       Precautions / Restrictions Precautions Precautions: Fall Restrictions Weight Bearing Restrictions: No      Mobility Bed Mobility Overal bed mobility: Independent             General bed mobility comments: Pt easily able to transtion supine<>sit w/o assist    Transfers Overall transfer level: Modified independent Equipment used: None               General transfer comment: Pt was able to rise to standing with appropriate UE use, no need for direct physical assist.      Balance Overall balance assessment: Needs assistance Sitting-balance support: No upper extremity supported Sitting balance-Leahy Scale: Good Sitting balance - Comments: able to safely maintain sitting balance w/o UEs   Standing balance support: No upper extremity supported Standing balance-Leahy Scale: Fair Standing balance comment: Mild unsteadiness during functional tasks                           ADL either performed or assessed with clinical judgement   ADL Overall ADL's : Needs assistance/impaired     Grooming: Wash/dry hands;Wash/dry face;Brushing hair;Oral care;Standing;Set up   Upper Body Bathing: Standing;Set up           Lower Body Dressing: Supervision/safety Lower Body Dressing Details (indicate cue type and reason): Dons/doffs socks using  figure four  technique seated EOB Toilet Transfer: Supervision/safety;Ambulation   Toileting- Clothing Manipulation and Hygiene: Supervision/safety;Sit to/from stand       Functional mobility during ADLs: Supervision/safety General ADL Comments: Overall, pt performs standing grooming tasks at sink with setup/supervision and toileting tasks with supervision. Does require min redirectional cuing to stay on task, some deficits in sustained attn noted.     Vision Baseline Vision/History: 1 Wears glasses (Able to read clock and name badge, reports diplopia several days ago, but none today) Ability to See in Adequate Light: 0 Adequate Patient Visual Report: No change from baseline              Pertinent Vitals/Pain Pain Assessment Pain Assessment: No/denies pain     Hand Dominance     Extremity/Trunk Assessment Upper Extremity Assessment Upper Extremity Assessment: Overall WFL for tasks assessed (No focal neurological deficits noted, slow performance on finger-to-nose test but likely due to processing vs physical difficulties)   Lower Extremity Assessment Lower Extremity Assessment: Overall WFL for tasks assessed       Communication Communication Communication: No difficulties   Cognition Arousal/Alertness: Awake/alert   Overall Cognitive Status: No family/caregiver present to determine baseline cognitive functioning                                 General Comments: PTA, pt reports arrival to work with disheveled appearance and not wearing typical clothing. Confusion prior. Pt recalls details of event prompting arrival to hospital, showing good awareness of overall situation but occasional perseveration on upcoming cardiac procedure and redirection required for task performance during OT eval     General Comments  Not at baseline - deficits noted in sustained attention and need for repeated cuing/redirection. Pt reports dyslexia at baseline which causes her to  have a hard time processing (?). Unsure of how this compares to her baseline.            Home Living Family/patient expects to be discharged to:: Private residence Living Arrangements: Children Available Help at Discharge: Available PRN/intermittently (103 y/o son works night shift)   Home Access: Stairs to enter Secretary/administrator of Steps: 4 Entrance Stairs-Rails: Right;Left Home Layout: One level               Home Equipment: None          Prior Functioning/Environment Prior Level of Function : Independent/Modified Independent;Working/employed;Driving             Mobility Comments: Pt reports recent decreases in activity tolerance and generally feeling less steady.  Reports some recent falls, consistently out of bed recently... ADLs Comments: Pt reports being able to manage as needed        OT Problem List: Decreased cognition;Impaired balance (sitting and/or standing);Decreased safety awareness;Decreased activity tolerance      OT Treatment/Interventions: Self-care/ADL training;Patient/family education;Cognitive remediation/compensation;Balance training;Therapeutic activities    OT Goals(Current goals can be found in the care plan section) ADL Goals Pt Will Perform Grooming: with modified independence;standing Pt Will Transfer to Toilet: with modified independence;ambulating Pt Will Perform Tub/Shower Transfer: with modified independence;ambulating  OT Frequency: Min 2X/week       AM-PAC OT "6 Clicks" Daily Activity     Outcome Measure Help from another person eating meals?: None Help from another person taking care of personal grooming?: A Little Help from another person toileting, which includes using toliet, bedpan, or urinal?: A Little Help from another person bathing (including washing,  rinsing, drying)?: A Little Help from another person to put on and taking off regular upper body clothing?: None Help from another person to put on and taking  off regular lower body clothing?: A Little 6 Click Score: 20   End of Session Equipment Utilized During Treatment: Gait belt  Activity Tolerance: Patient tolerated treatment well Patient left: Other (comment) (MD in room)  OT Visit Diagnosis: Unsteadiness on feet (R26.81);Muscle weakness (generalized) (M62.81);Other abnormalities of gait and mobility (R26.89);Other symptoms and signs involving the nervous system (R29.898)                Time: 1000-1034 OT Time Calculation (min): 34 min Charges:  OT General Charges $OT Visit: 1 Visit OT Evaluation $OT Eval Moderate Complexity: 1 Mod OT Treatments $Self Care/Home Management : 8-22 mins Beverlyn Mcginness L. Hillary Struss, OTR/L  08/03/22, 2:29 PM

## 2022-08-03 NOTE — Progress Notes (Deleted)
Occupational Therapy Evaluation Patient Details Name: Kylie Wright MRN: [id number] DOB: [birth year] Today's Date: 08/03/2022   History of Present Illness 57 year old female with history of bipolar disorder, ADHD, chronic dizziness, hyperlipidemia, insomnia, who presents to the emergency department for chief concerns of strokelike symptoms.  Defecits primarily confusion and inability to organize rote activities.   Clinical Impression   Patient presenting with impaired cognition, most noted in sustained attention and is easily distracted throughout OT evaluation. Requires cuing and redirection to stay off task, often perseverating on recent events. Pt reports dyslexia at baseline, which she says causes her to "have a hard time understanding". No focal neurological deficits noted. Pt with hx of dizziness due to cardiac condition as self-reported (has open heart surgery scheduled). Patient lives with adult son, drives, manages meds + money, working in the mental health PTA. She does endorse frequent fall history, falling once in bathroom d/t dizziness. Overall, supervision/setup with vcs for efficient task performance, good functional mobility overall but with general unsteadiness. Patient currently functioning below baseline with executive functioning delays. Patient will benefit from acute OT to increase overall independence in the areas of ADLs, functional mobility, safety awareness, efficient task performance in order to safely discharge.       Recommendations for follow up therapy are one component of a multi-disciplinary discharge planning process, led by the attending physician.  Recommendations may be updated based on patient status, additional functional criteria and insurance authorization.   Assistance Recommended at Discharge Intermittent Supervision/Assistance  Patient can return home with the following Other (comment);Direct supervision/assist for financial management;Direct  supervision/assist for medications management;A little help with walking and/or transfers;A little help with bathing/dressing/bathroom (Recommending medical clearance prior to driving)    Functional Status Assessment     Equipment Recommendations  Tub/shower bench    Recommendations for Other Services       Precautions / Restrictions Precautions Precautions: Fall Restrictions Weight Bearing Restrictions: No      Mobility Bed Mobility Overal bed mobility: Independent             General bed mobility comments: Pt easily able to transtion supine<>sit w/o assist    Transfers Overall transfer level: Modified independent Equipment used: None               General transfer comment: Pt was able to rise to standing with appropriate UE use, no need for direct physical assist.      Balance Overall balance assessment: Needs assistance Sitting-balance support: No upper extremity supported Sitting balance-Leahy Scale: Good Sitting balance - Comments: able to safely maintain sitting balance w/o UEs   Standing balance support: No upper extremity supported Standing balance-Leahy Scale: Fair Standing balance comment: Mild unsteadiness during functional tasks                           ADL either performed or assessed with clinical judgement   ADL Overall ADL's : Needs assistance/impaired     Grooming: Wash/dry hands;Wash/dry face;Brushing hair;Oral care;Standing;Set up   Upper Body Bathing: Standing;Set up           Lower Body Dressing: Supervision/safety Lower Body Dressing Details (indicate cue type and reason): Dons/doffs socks using figure four technique seated EOB Toilet Transfer: Supervision/safety;Ambulation   Toileting- Clothing Manipulation and Hygiene: Supervision/safety;Sit to/from stand       Functional mobility during ADLs: Supervision/safety General ADL Comments: Overall, pt performs standing grooming tasks at sink with  setup/supervision and toileting tasks with  supervision. Does require min redirectional cuing to stay on task, some deficits in sustained attn noted.     Vision Baseline Vision/History: 1 Wears glasses (Able to read clock and name badge, reports diplopia several days ago, but none today) Ability to See in Adequate Light: 0 Adequate Patient Visual Report: No change from baseline              Pertinent Vitals/Pain Pain Assessment Pain Assessment: No/denies pain     Hand Dominance     Extremity/Trunk Assessment Upper Extremity Assessment Upper Extremity Assessment: Overall WFL for tasks assessed (No focal neurological deficits noted, slow performance on finger-to-nose test but likely due to processing vs physical difficulties)   Lower Extremity Assessment Lower Extremity Assessment: Overall WFL for tasks assessed       Communication Communication Communication: No difficulties   Cognition Arousal/Alertness: Awake/alert   Overall Cognitive Status: No family/caregiver present to determine baseline cognitive functioning                                 General Comments: PTA, pt reports arrival to work with disheveled appearance and not wearing typical clothing. Confusion prior. Pt recalls details of event prompting arrival to hospital, showing good awareness of overall situation but occasional perseveration on upcoming cardiac procedure and redirection required for task performance during OT eval     General Comments  Not at baseline - deficits noted in sustained attention and need for repeated cuing/redirection. Pt reports dyslexia at baseline which causes her to have a hard time processing (?). Unsure of how this compares to her baseline.            Home Living Family/patient expects to be discharged to:: Private residence Living Arrangements: Children Available Help at Discharge: Available PRN/intermittently (56 y/o son works night shift)   Home Access:  Stairs to enter Secretary/administrator of Steps: 4 Entrance Stairs-Rails: Right;Left Home Layout: One level               Home Equipment: None          Prior Functioning/Environment Prior Level of Function : Independent/Modified Independent;Working/employed;Driving             Mobility Comments: Pt reports recent decreases in activity tolerance and generally feeling less steady.  Reports some recent falls, consistently out of bed recently... ADLs Comments: Pt reports being able to manage as needed        OT Problem List:        OT Treatment/Interventions:      OT Goals(Current goals can be found in the care plan section) ADL Goals Pt Will Perform Grooming: with modified independence;standing Pt Will Transfer to Toilet: with modified independence;ambulating Pt Will Perform Tub/Shower Transfer: with modified independence;ambulating  OT Frequency: Min 2X/week       AM-PAC OT "6 Clicks" Daily Activity     Outcome Measure Help from another person eating meals?: None Help from another person taking care of personal grooming?: A Little Help from another person toileting, which includes using toliet, bedpan, or urinal?: A Little Help from another person bathing (including washing, rinsing, drying)?: A Little Help from another person to put on and taking off regular upper body clothing?: None Help from another person to put on and taking off regular lower body clothing?: A Little 6 Click Score: 20   End of Session Equipment Utilized During Treatment: Gait belt  Activity Tolerance: Patient tolerated treatment  well Patient left: Other (comment) (MD in room)  OT Visit Diagnosis: Unsteadiness on feet (R26.81);Muscle weakness (generalized) (M62.81);Other abnormalities of gait and mobility (R26.89);Other symptoms and signs involving the nervous system (R29.898)                Time: 1000-1034 OT Time Calculation (min): 34 min Charges:  OT General Charges $OT Visit: 1  Visit OT Evaluation $OT Eval Moderate Complexity: 1 Mod OT Treatments $Self Care/Home Management : 8-22 mins  Juhi Lagrange L. Jerman Tinnon, OTR/L  08/03/22, 12:13 PM

## 2022-08-03 NOTE — Consult Note (Signed)
Neurology Consultation  Reason for Consult: Stroke Referring Physician: Dr. Sedalia Muta  CC: Altered mental status, stroke on MRI  History is obtained from: Patient, chart  HPI: Kylie Wright is a 57 y.o. female past medical history of HOCM, ADD, prior history of alcohol and drug abuse, hyperlipidemia, presented to the emergency room for concerns for strokelike symptoms.  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 At triage, documented information says patient giving conflicting information-states she might have been falling a lot but really not sure.  Also she thinks she fell and hit her head in the bathroom.  Answered appropriately to orientation questions.  Reported frequent urination and burning. Reported that she is scheduled for an open heart surgery on Monday at [facility] for her HOCM.  She reports dizziness which has been a part of her cardiac issues and not new but the confusion and unable to figure out how to put her clothes on and how to get to work were concerning that made her come to the ER. MRI of the brain was done as a part of evaluation and showed late acute to early subacute left corona radiata and left basal ganglia areas of infarction for which neurology consultation was obtained denies any prior strokes.  Denies any recent illnesses or sicknesses.  LKW: 2 PM on 08/01/2022 IV thrombolysis given?: no, outside the window, nonfocal on exam per chart review Premorbid modified Rankin scale (mRS): 2-mainly due to cardiac issues and shortness of breath from HCM  ROS: Full ROS was performed and is negative except as noted in the HPI.  Past Medical History:  Diagnosis Date   ADHD (attention deficit hyperactivity disorder)    Bipolar 1 disorder (HCC)    History of alcohol abuse    History of drug abuse (HCC)    Interstitial cystitis    Other  hypertrophic cardiomyopathy (HCC)      Family History  Problem Relation Age of Onset   Arthritis Mother    Hyperlipidemia Mother    Heart disease Mother    Stroke Mother    Hypertension Mother    Diabetes Mother    Mental illness Mother    Alcohol abuse Father    Hypertension Other    Diabetes Other    Mental illness Other    Cancer Other    Cancer Sister 4       small intestine    Alcoholism Other      Social History:   reports that she quit smoking about 4 months ago. Her smoking use included cigarettes. She smoked an average of .5 packs per day. She has never used smokeless tobacco. She reports that she does not drink alcohol and does not use drugs.  Medications  Current Facility-Administered Medications:    acetaminophen (TYLENOL) tablet 650 mg, 650 mg, Oral, Q4H PRN **OR** acetaminophen (TYLENOL) 160 MG/5ML solution 650 mg, 650 mg, Per Tube, Q4H PRN **OR** acetaminophen (TYLENOL) suppository 650 mg, 650 mg, Rectal, Q4H PRN, Cox, Amy N, DO   amphetamine-dextroamphetamine (ADDERALL XR) 24 hr capsule 20 mg, 20 mg, Oral, q morning, Cox, Amy N, DO, 20 mg at 08/03/22 1126   disopyramide (NORPACE) capsule 100 mg, 100 mg, Oral, QID, Cox, Amy N, DO   docusate sodium (COLACE) capsule 100 mg, 100 mg, Oral, Daily PRN, Cox, Amy N, DO  enoxaparin (LOVENOX) injection 45 mg, 0.5 mg/kg, Subcutaneous, Q24H, Orson Aloe, RPH   hydrALAZINE (APRESOLINE) injection 5 mg, 5 mg, Intravenous, Q8H PRN, Cox, Amy N, DO   melatonin tablet 10 mg, 10 mg, Oral, QHS, Cox, Amy N, DO, 10 mg at 08/02/22 2209   OLANZapine-Samidorphan 5-10 MG TABS 1 tablet, 1 tablet, Oral, Daily, Cox, Amy N, DO   polyethylene glycol (MIRALAX / GLYCOLAX) packet 17 g, 17 g, Oral, Daily PRN, Cox, Amy N, DO   rosuvastatin (CRESTOR) tablet 10 mg, 10 mg, Oral, Daily, Cox, Amy N, DO, 10 mg at 08/03/22 1126   senna-docusate (Senokot-S) tablet 1 tablet, 1 tablet, Oral, QHS PRN, Cox, Amy N, DO   Exam: Current vital  signs: BP (!) 149/89 (BP Location: Left Arm)   Pulse 81   Temp 98.4 F (36.9 C) (Oral)   Resp 18   Ht 5\' 2"  (1.575 m)   Wt 89.8 kg   SpO2 96%   BMI 36.21 kg/m  Vital signs in last 24 hours: Temp:  [98.2 F (36.8 C)-98.4 F (36.9 C)] 98.4 F (36.9 C) (05/15 0757) Pulse Rate:  [71-90] 81 (05/15 0757) Resp:  [13-20] 18 (05/15 0757) BP: (142-171)/(84-102) 149/89 (05/15 0757) SpO2:  [93 %-100 %] 96 % (05/15 0757) General: Awake alert in no distress HEENT: Normocephalic, atraumatic Lungs: Clear Cardiovascular: Regular rhythm Abdomen nondistended nontender Extremities warm well-perfused Neurological exam Awake alert oriented x 3 No dysarthria No aphasia Cranial nerves II to XII intact Motor examination with no drift in any of the 4 extremities Sensation diminished somewhat on the right upper and lower extremity in comparison to the left. Coordination exam reveals no dysmetria NIH stroke scale-1 for sensory   Labs I have reviewed labs in epic and the results pertinent to this consultation are:  CBC    Component Value Date/Time   WBC 10.3 08/02/2022 1025   RBC 3.75 (L) 08/02/2022 1025   HGB 12.1 08/02/2022 1025   HGB 13.7 03/03/2022 1305   HCT 37.0 08/02/2022 1025   HCT 41.1 03/03/2022 1305   PLT 203 08/02/2022 1025   PLT 198 03/03/2022 1305   MCV 98.7 08/02/2022 1025   MCV 100 (H) 03/03/2022 1305   MCH 32.3 08/02/2022 1025   MCHC 32.7 08/02/2022 1025   RDW 14.3 08/02/2022 1025   RDW 13.3 03/03/2022 1305   LYMPHSABS 3.9 04/15/2022 1120   MONOABS 0.8 04/15/2022 1120   EOSABS 0.3 04/15/2022 1120   BASOSABS 0.1 04/15/2022 1120    CMP     Component Value Date/Time   NA 132 (L) 08/02/2022 1025   NA 140 05/13/2022 1348   K 4.1 08/02/2022 1025   CL 103 08/02/2022 1025   CO2 21 (L) 08/02/2022 1025   GLUCOSE 132 (H) 08/02/2022 1025   BUN 15 08/02/2022 1025   BUN 17 05/13/2022 1348   CREATININE 1.36 (H) 08/02/2022 1025   CALCIUM 8.7 (L) 08/02/2022 1025   PROT  7.9 04/15/2022 1120   ALBUMIN 4.1 04/15/2022 1120   AST 17 04/15/2022 1120   ALT 12 04/15/2022 1120   ALKPHOS 90 04/15/2022 1120   BILITOT 0.4 04/15/2022 1120   GFRNONAA 45 (L) 08/02/2022 1025   GFRAA >90 06/07/2014 1030    Lipid Panel     Component Value Date/Time   CHOL 127 08/03/2022 0352   TRIG 194 (H) 08/03/2022 0352   HDL 30 (L) 08/03/2022 0352   CHOLHDL 4.2 08/03/2022 0352   VLDL 39 08/03/2022 0352   LDLCALC  58 08/03/2022 0352    A1c 5.8 in January 2024  2D echocardiogram: Severe asymmetric septal LV wall thickness-septal wall measuring up to 1.9 centimeters.  There is dynamic LVOT obstruction.  Findings consistent with obstructive HCM LVEF 65 to 70%.  Grade 1 diastolic dysfunction.  Aortic valve not well-visualized.  Mitral valve normal.  Left atrium size normal.  Imaging I have reviewed the images obtained:  CT-head no acute findings.  She has disproportionate for age ventriculomegaly in the third ventricle and lateral ventricles.  MRI examination of the brain: 2 mm acute/early subacute infarct in the left corona radiata.  2 mm acute/early subacute infarct in the left basal ganglia.  Small nonspecific T2/FLAIR hyperintense remote insult within the right frontal parietal white matter.  Small volume fluid within the right mastoid air cells. MR angiogram of the head with no intracranial large vessel occlusion or proximal high-grade stenosis. MR angiogram of the neck with atherosclerotic narrowing of the proximal ICA up to 70% however it is possible that the stenosis is exaggerated by motion.  CTA head and neck recommended.  CTA head and neck reveals no significant stenosis-MR angiogram neck findings likely artifactual.  Assessment:  57 year old presenting to the emergency room for confusion and sudden onset of inability to put her clothes on and get to work and appeared confused. At this time, her exam is essentially nonfocal. MR imaging reveals late acute/early subacute  infarcts in the left hemisphere in the basal ganglia as well as corona radiata-cardioembolic versus concomitant small vessel etiology suspected. She has a history of HOCM and is scheduled for surgery at [facility] on Monday. I would recommend completing the stroke risk factor workup before proceeding with surgery.  Impression: Subacute ischemic strokes in the left hemisphere-cardioembolic versus concomitant small vessel disease.  Recommendations: 2D echo findings consistent with obstructive HCM.  Today's TTE-left atrial size appeared normal.  Telemetry not revealing of atrial fibrillation.    Has had TEE done in January with no evidence of atrial level shunt or LAA thrombus but reported LA size to be mildly dilated. Will need outpatient 30-day cardiac monitoring to rule out paroxysmal A-fib.  If unremarkable, may need loop recorder placement. For stroke prevention-aspirin 81+ Plavix 75. On rosuvastatin 10 at home-LDL less than 70.  Continue rosuvastatin Cardiology and cardiothoracic surgery follow-up before proceeding with any elective surgery.  Usual guidelines recommend waiting 3 to 6 months for elective surgery poststroke.  The strokes are not likely from yesterday but likely rather more subacute, but nonetheless are present.  -- Milon Dikes, MD Neurologist [facility] Pager: [phone]

## 2022-08-03 NOTE — Telephone Encounter (Signed)
Patient's sister calling in to see how she can get the forms to our office to get filled out by dr Izora Ribas for patient job. Patient's sister is asking that someone gives her a call back. Please advise

## 2022-08-03 NOTE — Telephone Encounter (Signed)
Called pt sister to inform that MD is looking into pt current hospital stay.  Sister advised me that Dr. Izora Ribas has already called her and given her an update.  Thanked me for assisting.  Will call back if has further concerns.

## 2022-08-04 ENCOUNTER — Telehealth: Payer: Self-pay | Admitting: Internal Medicine

## 2022-08-04 DIAGNOSIS — I63511 Cerebral infarction due to unspecified occlusion or stenosis of right middle cerebral artery: Secondary | ICD-10-CM | POA: Diagnosis not present

## 2022-08-04 MED ORDER — CLOPIDOGREL BISULFATE 75 MG PO TABS: 75.0000 mg | ORAL_TABLET | Freq: Every day | ORAL | 0 refills | Status: AC

## 2022-08-04 MED ORDER — ASPIRIN 81 MG PO TBEC: 81.0000 mg | DELAYED_RELEASE_TABLET | Freq: Every day | ORAL | 11 refills | Status: DC

## 2022-08-04 NOTE — Discharge Summary (Addendum)
Physician Discharge Summary  Kylie Wright MWU:[id number] DOB: 08/25/1965 DOA: 08/02/2022  PCP: Pincus Sanes, MD  Admit date: 08/02/2022 Discharge date: 08/04/2022  Admitted From: Home Disposition: Home with home health/PT OT  Recommendations for Outpatient Follow-up:  Follow up with PCP in 1-2 weeks Will send referral to neurology for follow-up Follow-up with your cardiology and cardiothoracic surgery, thoracic surgery was notified about your new events.  Home Health: PT/OT Equipment/Devices: None  Discharge Condition: Stable CODE STATUS: Full code Diet recommendation: Low-salt diet  Discharge summary: 57 year old with history of bipolar disorder, ADHD, chronic dizziness, hyperlipidemia, insomnia and HOCM who is scheduled to have cardiac surgery next Tuesday was found confused while at work and coworkers called EMS and sent her to ER.  Patient apparently showed up in the meeting with minimal clothes on her body and not knowing what is going on.  By the time she came to emergency room she was alert awake and oriented.  She had no neurological deficits.  She was hemodynamically stable.  Serologically normal with normal renal functions and normal LFTs.  Urinalysis was normal.  Patient does have history of poor balance, shortness of breath.  Recently quit his smoking.  Admitted to the hospital with altered mental status and found to have acute ischemic stroke.     Assessment & Plan:   Left MCA territory ischemic stroke: Clinical findings, confusion and amnesia. CT head findings, no acute findings.  Mild cerebral white matter disease. MRI of the brain, 2 mm acute or subacute infarct left corona radiata, left basal ganglia. MR angiogram of the head and neck, no large vessel occlusion.  No contractions or stenosis.  MRA neck with patent arteries. CT angiogram without evidence of aneurysm, large vessel occlusion. 2D echocardiogram with normal ejection fraction.  23 mm basal hypertrophy with  septal thickness. Telemetry without evidence of A-fib. Antiplatelet therapy, patient is not on any antiplatelet therapy at home.  Loaded with aspirin. Will discharge with aspirin Plavix for 3 weeks then aspirin alone.  See below for discussion about surgery. LDL 58.  On a statin at home.  At goal.  Continue Crestor. Hemoglobin A1c, 5.83 months ago.  Nondiabetic. Therapy recommendations, home with home health therapies. She quit smoking recently. Seen by neurology.   CKD stage IIIa: Baseline creatinine about 1.3-1.5.  Stable.   HOCM: Patient on Disopyramide 100 mg 4 times daily.  Continued.  She is scheduled to have surgery next week.   Medical issues including  Hypertension, stable  Hyperlipidemia, on Crestor 10 mg.  Bipolar disorder, she is on olanzapine that will be continued.  Preoperative assessment/scheduled septal myomectomy: Patient is scheduled for elective septal myomectomy on 5/21. Currently hemodynamically stable and neurologically stable, however with patient suffering from ischemic stroke the elective surgeries are recommended to be postponed 3-6 months. Since patient is not going to have thoracic surgery within weeks, will start patient on aspirin and Plavix. Will send referral to neurology for recheck and evaluation. This is communicated with her cardiothoracic surgery, Dr. Romona Curls and discussed with his office.  Driving recommendations: Recommended not to drive until cleared by neurology in the office given presentation with confusion. Patient without any neurological deficits at this time.   Patient medically stable for discharge.  Discharge Diagnoses:  Principal Problem:   Stroke [facility]) Active Problems:   Bipolar disorder (HCC)   Hyperlipidemia   PTSD (post-traumatic stress disorder)   Hypertension   Obesity (BMI 30.0-34.9)   HOCM (hypertrophic obstructive cardiomyopathy) (HCC)   Stroke (  cerebrum) [facility])    Discharge Instructions  Discharge  Instructions     Ambulatory referral to Neurology   Complete by: As directed    An appointment is requested in approximately: 4 weeks   Diet - low sodium heart healthy   Complete by: As directed    Increase activity slowly   Complete by: As directed       Allergies as of 08/04/2022       Reactions   Aspirin Other (See Comments)   Patient reports Dr Sherron Monday has advised her not to take aspirin because of interstitial cystitis.    Mavacamten Other (See Comments)   Hair loss        Medication List     TAKE these medications    amphetamine-dextroamphetamine 20 MG 24 hr capsule Commonly known as: ADDERALL XR Take 20 mg by mouth every morning.   aspirin EC 81 MG tablet Take 1 tablet (81 mg total) by mouth daily. Swallow whole.   clopidogrel 75 MG tablet Commonly known as: Plavix Take 1 tablet (75 mg total) by mouth daily for 21 days.   disopyramide 100 MG capsule Commonly known as: NORPACE Take 1 capsule (100 mg total) by mouth 4 (four) times daily.   docusate sodium 100 MG capsule Commonly known as: COLACE Take 100 mg by mouth daily as needed for moderate constipation or mild constipation. Can take up to 3 tabs per day as needed   Dramamine 25 MG tablet Generic drug: meclizine Take 25 mg by mouth 3 (three) times daily as needed for dizziness.   Melatonin 10 MG Tabs Take 10 mg by mouth at bedtime.   meloxicam 7.5 MG tablet Commonly known as: MOBIC TAKE 1 TABLET BY MOUTH TWICE  DAILY AS NEEDED FOR PAIN What changed:  how much to take how to take this when to take this additional instructions   metoprolol succinate 25 MG 24 hr tablet Commonly known as: Toprol XL Take 1 tablet (25 mg total) by mouth daily.   OLANZapine-Samidorphan 5-10 MG Tabs Take 1 tablet by mouth daily.   Pazeo 0.7 % Soln Generic drug: Olopatadine HCl Place 1 drop into both eyes daily as needed (allergies).   polyethylene glycol 17 g packet Commonly known as: MIRALAX /  GLYCOLAX Take 17 g by mouth daily as needed for severe constipation, moderate constipation or mild constipation.   potassium chloride 10 MEQ tablet Commonly known as: KLOR-CON M Take 10 mEq by mouth daily.   Rocklatan 0.02-0.005 % Soln Generic drug: Netarsudil-Latanoprost SMARTSIG:1 Drop(s) In Eye(s) Every Evening   rosuvastatin 10 MG tablet Commonly known as: CRESTOR TAKE 1 TABLET (10 MG TOTAL) BY MOUTH DAILY.   solifenacin 10 MG tablet Commonly known as: VESICARE Take 10 mg by mouth daily.   spironolactone 25 MG tablet Commonly known as: ALDACTONE Take 1 tablet (25 mg total) by mouth daily.        Follow-up Information     Pincus Sanes, MD.   Specialty: Internal Medicine Contact information: [address] [place] [address] [phone]                Allergies  Allergen Reactions   Aspirin Other (See Comments)    Patient reports Dr Sherron Monday has advised her not to take aspirin because of interstitial cystitis.    Mavacamten Other (See Comments)    Hair loss    Consultations: Neurology   Procedures/Studies: CT ANGIO HEAD NECK W WO CM  Result Date: 08/03/2022 CLINICAL  DATA:  Follow-up examination for stroke. EXAM: CT ANGIOGRAPHY HEAD AND NECK WITH AND WITHOUT CONTRAST TECHNIQUE: Multidetector CT imaging of the head and neck was performed using the standard protocol during bolus administration of intravenous contrast. Multiplanar CT image reconstructions and MIPs were obtained to evaluate the vascular anatomy. Carotid stenosis measurements (when applicable) are obtained utilizing NASCET criteria, using the distal internal carotid diameter as the denominator. RADIATION DOSE REDUCTION: This exam was performed according to the departmental dose-optimization program which includes automated exposure control, adjustment of the mA and/or kV according to patient size and/or use of iterative reconstruction technique. CONTRAST:  75mL OMNIPAQUE IOHEXOL 350  MG/ML SOLN COMPARISON:  Prior studies from 08/02/2022. FINDINGS: CT HEAD FINDINGS Brain: 0 volume within normal limits. No visible acute large vessel territory infarct. No intracranial hemorrhage. No mass lesion or midline shift. Mild ventricular prominence without overt hydrocephalus, stable. No extra-axial fluid collection. Vascular: No ab hyperdense vessel. Skull: Scalp soft tissues and calvarium demonstrate no new finding. Sinuses/Orbits: Globes and orbital soft tissues within normal limits. Scattered mucosal thickening present about the sphenoid ethmoidal and maxillary sinuses. No significant mastoid effusion. Other: None. Review of the MIP images confirms the above findings CTA NECK FINDINGS Aortic arch: Standard branching. Imaged portion shows no evidence of aneurysm or dissection. No significant stenosis of the major arch vessel origins. Right carotid system: No evidence of dissection, stenosis (50% or greater), or occlusion. Left carotid system: No evidence of dissection, stenosis (50% or greater), or occlusion. Vertebral arteries: Both vertebral arteries arise from subclavian arteries. No proximal subclavian artery stenosis. Left vertebral artery slightly dominant. Vertebral arteries patent without stenosis or dissection. Skeleton: No discrete or worrisome osseous lesions. Mild to moderate cervical spondylosis. Other neck: No other acute finding within the neck. Upper chest: Scattered patchy opacity within the partially visualized left upper and lower lobes, suggesting mild pneumonia. Review of the MIP images confirms the above findings CTA HEAD FINDINGS Anterior circulation: Both internal carotid arteries are widely patent to the termini without stenosis. A1 segments patent bilaterally. Normal anterior communicating complex. Anterior cerebral arteries patent without stenosis. No M1 stenosis or occlusion. No proximal MCA branch occlusion or high-grade stenosis. Distal to branches perfused and symmetric.  Posterior circulation: Both V4 segments patent without stenosis. Both PICA patent. Basilar mildly diminutive but patent without stenosis. Superior cerebral arteries patent bilaterally. Left PCA supplied via the basilar. Predominant fetal type origin of the right PCA. Both PCAs widely patent without stenosis. Venous sinuses: Grossly patent allowing for timing the contrast bolus. Anatomic variants: As above.  No aneurysm. Review of the MIP images confirms the above findings IMPRESSION: 1. Negative CTA of the head and neck. No large vessel occlusion, hemodynamically significant stenosis, or other acute vascular abnormality. 2. Scattered patchy opacity within the partially visualized left upper and lower lobes, suggesting mild pneumonia. Electronically Signed   By: Rise Mu M.D.   On: 08/03/2022 19:26   ECHOCARDIOGRAM COMPLETE  Result Date: 08/03/2022    ECHOCARDIOGRAM REPORT   Patient Name:   CLYTIE STANISZEWSKI Date of Exam: 08/03/2022 Medical Rec #:  [id number]      Height:       62.0 in Accession #:    [id number]     Weight:       198.0 lb Date of Birth:  1966-03-16       BSA:          1.904 m Patient Age:    57 years  BP:           149/89 mmHg Patient Gender: F              HR:           70 bpm. Exam Location:  [facility] Procedure: 2D Echo, Cardiac Doppler and Color Doppler Indications:     Stroke  History:         Patient has prior history of Echocardiogram examinations, most                  recent 03/21/2022. Stroke; Risk Factors:Hypertension,                  Dyslipidemia and Former Smoker. HOCM-Scheduled for surgery at                  [facility] on 08/08/22.  Sonographer:     Mikki Harbor Referring Phys:  [id number] AMY N COX Diagnosing Phys: Debbe Odea MD IMPRESSIONS  1. There is severe asymmetric septal LV wall thickness, septal wall measuring upto 1.9cm. there is dynamic LVOT obstruction. Findings consistent with obstructive HCM. Marland Kitchen Left ventricular ejection fraction, by estimation, is 65 to 70%. The  left ventricle has normal function. The left ventricle has no regional wall motion abnormalities. There is severe asymmetric left ventricular hypertrophy. Left ventricular diastolic parameters are consistent with Grade I diastolic dysfunction (impaired relaxation).  2. Right ventricular systolic function is normal. The right ventricular size is normal.  3. The mitral valve is normal in structure. Mild to moderate mitral valve regurgitation.  4. The aortic valve was not well visualized. Aortic valve regurgitation is mild.  5. The inferior vena cava is normal in size with greater than 50% respiratory variability, suggesting right atrial pressure of 3 mmHg. FINDINGS  Left Ventricle: There is severe asymmetric septal LV wall thickness, septal wall measuring upto 1.9cm. there is dynamic LVOT obstruction. Findings consistent with obstructive HCM. Left ventricular ejection fraction, by estimation, is 65 to 70%. The left  ventricle has normal function. The left ventricle has no regional wall motion abnormalities. The left ventricular internal cavity size was small. There is severe asymmetric left ventricular hypertrophy. Left ventricular diastolic parameters are consistent with Grade I diastolic dysfunction (impaired relaxation). Right Ventricle: The right ventricular size is normal. No increase in right ventricular wall thickness. Right ventricular systolic function is normal. Left Atrium: Left atrial size was normal in size. Right Atrium: Right atrial size was normal in size. Pericardium: There is no evidence of pericardial effusion. Mitral Valve: The mitral valve is normal in structure. Mild to moderate mitral valve regurgitation, with posteriorly-directed jet. MV peak gradient, 13.4 mmHg. The mean mitral valve gradient is 4.0 mmHg. Tricuspid Valve: The tricuspid valve is normal in structure. Tricuspid valve regurgitation is not demonstrated. Aortic Valve: The aortic valve was not well visualized. Aortic valve  regurgitation is mild. Aortic valve mean gradient measures 9.5 mmHg. Aortic valve peak gradient measures 20.9 mmHg. Aortic valve area, by VTI measures 2.28 cm. Pulmonic Valve: The pulmonic valve was not well visualized. Pulmonic valve regurgitation is not visualized. Aorta: The aortic root is normal in size and structure. Venous: The inferior vena cava is normal in size with greater than 50% respiratory variability, suggesting right atrial pressure of 3 mmHg. IAS/Shunts: No atrial level shunt detected by color flow Doppler.  LEFT VENTRICLE PLAX 2D LVIDd:         3.00 cm   Diastology LVIDs:  1.80 cm   LV e' medial:    6.42 cm/s LV PW:         1.80 cm   LV E/e' medial:  10.5 LV IVS:        2.00 cm   LV e' lateral:   7.18 cm/s LVOT diam:     1.90 cm   LV E/e' lateral: 9.4 LV SV:         113 LV SV Index:   60 LVOT Area:     2.84 cm  RIGHT VENTRICLE RV Basal diam:  2.80 cm RV Mid diam:    2.50 cm RV S prime:     12.10 cm/s TAPSE (M-mode): 2.4 cm LEFT ATRIUM             Index        RIGHT ATRIUM           Index LA diam:        4.20 cm 2.21 cm/m   RA Area:     13.10 cm LA Vol (A2C):   44.1 ml 23.17 ml/m  RA Volume:   24.40 ml  12.82 ml/m LA Vol (A4C):   50.7 ml 26.63 ml/m LA Biplane Vol: 48.8 ml 25.64 ml/m  AORTIC VALVE                     PULMONIC VALVE AV Area (Vmax):    2.51 cm      PV Vmax:       1.48 m/s AV Area (Vmean):   2.42 cm      PV Peak grad:  8.8 mmHg AV Area (VTI):     2.28 cm AV Vmax:           228.50 cm/s AV Vmean:          139.500 cm/s AV VTI:            0.497 m AV Peak Grad:      20.9 mmHg AV Mean Grad:      9.5 mmHg LVOT Vmax:         202.00 cm/s LVOT Vmean:        119.000 cm/s LVOT VTI:          0.399 m LVOT/AV VTI ratio: 0.80  AORTA Ao Root diam: 3.00 cm Ao Asc diam:  2.70 cm MITRAL VALVE MV Area (PHT): 1.72 cm     SHUNTS MV Area VTI:   3.14 cm     Systemic VTI:  0.40 m MV Peak grad:  13.4 mmHg    Systemic Diam: 1.90 cm MV Mean grad:  4.0 mmHg MV Vmax:       1.83 m/s MV Vmean:       96.0 cm/s MV Decel Time: 440 msec MV E velocity: 67.60 cm/s MV A velocity: 145.00 cm/s MV E/A ratio:  0.47 Debbe Odea MD Electronically signed by Debbe Odea MD Signature Date/Time: 08/03/2022/1:19:15 PM    Final    MR BRAIN WO CONTRAST  Result Date: 08/02/2022 CLINICAL DATA:  Provided history: Neuro deficit, acute, stroke suspected. Altered mental status. EXAM: MRI HEAD WITHOUT CONTRAST MRA HEAD WITHOUT CONTRAST MRA NECK WITHOUT AND WITH CONTRAST TECHNIQUE: Multiplanar, multi-echo pulse sequences of the brain and surrounding structures were acquired without intravenous contrast. Angiographic images of the Circle of Willis were acquired using MRA technique without intravenous contrast. Angiographic images of the neck were acquired using MRA technique without and with intravenous contrast. Carotid stenosis measurements (when applicable) are obtained utilizing  NASCET criteria, using the distal internal carotid diameter as the denominator. CONTRAST:  9mL GADAVIST GADOBUTROL 1 MMOL/ML IV SOLN COMPARISON:  Head CT 08/02/2022 FINDINGS: MRI HEAD FINDINGS Intermittently motion degraded examination (with up to moderate motion degradation of the acquired sequences). Brain: No age advanced or lobar predominant parenchymal atrophy. 2 mm focus of restricted diffusion within the left corona radiata consistent with an acute/early subacute infarct (series 5, image 35) (series 7, image 20). 2 mm focus of restricted diffusion within the left lentiform nucleus consistent with an acute/early subacute infarct (series 5, image 28) (series 7, image 25). Apparent tiny foci of diffusion-weighted signal abnormality within the right corona radiata appreciated on the axial diffusion-weighted sequence only, not appreciated on the coronal diffusion-weighted sequence, and likely artifactual. Small nonspecific T2 FLAIR hyperintense remote insult within the right frontoparietal white matter (with associated wallerian degeneration  extending inferiorly within the cortical spinal tract). No evidence of an intracranial mass. No chronic intracranial blood products. No extra-axial fluid collection. No midline shift. Vascular: Maintained flow voids within the proximal large arterial vessels. Skull and upper cervical spine: No focal suspicious marrow lesion. Sinuses/Orbits: No mass or acute finding within the imaged orbits. Minimal mucosal thickening within the left ethmoid and bilateral sphenoid sinuses. Other: Small-volume fluid within the right mastoid air cells. MRA HEAD FINDINGS Mild to moderately motion degraded exam. Anterior circulation: ICA The M1 middle cerebral arteries are patent. No M2 proximal branch occlusion or high-grade proximal stenosis. The anterior cerebral arteries are patent. No intracranial aneurysm is identified. Posterior circulation: The intracranial vertebral arteries are patent. The right vertebral artery is developmentally diminutive beyond the PICA origin, but patent. The basilar artery is patent. The posterior cerebral arteries are patent. A right posterior communicating artery is present. The left posterior communicating artery is diminutive or absent. Anatomic variants: As described. MRA NECK FINDINGS Mild to moderately motion degraded exam. Aortic arch: Standard aortic branching. The visualized aortic arch is normal in caliber. No hemodynamically significant innominate or proximal subclavian artery stenosis. Right carotid system: The common carotid and internal carotid arteries are patent within the neck. Apparent atherosclerotic narrowing within the proximal ICA of up to 70%. Motion artifact limits evaluation of the proximal CCA. Within this limitation, no other hemodynamically significant stenosis is identified. Left carotid system: CCA and ICA patent within the neck. Less than 50% atherosclerotic narrowing of the proximal ICA. Motion degradation limits evaluation of the proximal CCA. Within this limitation, no  hemodynamically significant stenosis is identified elsewhere within the left carotid system within the neck. Vertebral arteries: Vertebral arteries patent within the neck. Apparent high-grade stenoses within the proximal V1 segments bilaterally. Other: None IMPRESSION: MRI brain: 1. Intermittently motion degraded exam. 2. 2 mm acute/early subacute infarct within the left corona radiata. 3. 2 mm acute/early subacute infarct within the left basal ganglia. 4. Small nonspecific T2 FLAIR hyperintense remote insult within the right frontoparietal white matter. 5. Otherwise unremarkable non-contrast MRI appearance of the brain. 6. Small-volume fluid within the right mastoid air cells. MRA head: 1. Mild-to-moderately motion degraded exam. 2. No intracranial large vessel occlusion or proximal high-grade arterial stenosis identified. MRA neck: 1. Motion degraded exam. 2. The common carotid and internal carotid arteries are patent within the neck. There is apparent atherosclerotic narrowing of the proximal right ICA of up to 70%. However, it is possible that this stenosis is exaggerated by motion artifact on the current exam. Consider a CTA of the neck for further evaluation. 3. Vertebral arteries patent within the  neck. There are apparent severe stenoses at the origins of the both vertebral arteries. However, these apparent stenoses could be exaggerated by motion artifact on the current exam. Consider a CTA of the neck for further evaluation. Electronically Signed   By: Jackey Loge D.O.   On: 08/02/2022 15:36   MR ANGIO HEAD WO CONTRAST  Result Date: 08/02/2022 CLINICAL DATA:  Provided history: Neuro deficit, acute, stroke suspected. Altered mental status. EXAM: MRI HEAD WITHOUT CONTRAST MRA HEAD WITHOUT CONTRAST MRA NECK WITHOUT AND WITH CONTRAST TECHNIQUE: Multiplanar, multi-echo pulse sequences of the brain and surrounding structures were acquired without intravenous contrast. Angiographic images of the Circle of  Willis were acquired using MRA technique without intravenous contrast. Angiographic images of the neck were acquired using MRA technique without and with intravenous contrast. Carotid stenosis measurements (when applicable) are obtained utilizing NASCET criteria, using the distal internal carotid diameter as the denominator. CONTRAST:  9mL GADAVIST GADOBUTROL 1 MMOL/ML IV SOLN COMPARISON:  Head CT 08/02/2022 FINDINGS: MRI HEAD FINDINGS Intermittently motion degraded examination (with up to moderate motion degradation of the acquired sequences). Brain: No age advanced or lobar predominant parenchymal atrophy. 2 mm focus of restricted diffusion within the left corona radiata consistent with an acute/early subacute infarct (series 5, image 35) (series 7, image 20). 2 mm focus of restricted diffusion within the left lentiform nucleus consistent with an acute/early subacute infarct (series 5, image 28) (series 7, image 25). Apparent tiny foci of diffusion-weighted signal abnormality within the right corona radiata appreciated on the axial diffusion-weighted sequence only, not appreciated on the coronal diffusion-weighted sequence, and likely artifactual. Small nonspecific T2 FLAIR hyperintense remote insult within the right frontoparietal white matter (with associated wallerian degeneration extending inferiorly within the cortical spinal tract). No evidence of an intracranial mass. No chronic intracranial blood products. No extra-axial fluid collection. No midline shift. Vascular: Maintained flow voids within the proximal large arterial vessels. Skull and upper cervical spine: No focal suspicious marrow lesion. Sinuses/Orbits: No mass or acute finding within the imaged orbits. Minimal mucosal thickening within the left ethmoid and bilateral sphenoid sinuses. Other: Small-volume fluid within the right mastoid air cells. MRA HEAD FINDINGS Mild to moderately motion degraded exam. Anterior circulation: ICA The M1 middle  cerebral arteries are patent. No M2 proximal branch occlusion or high-grade proximal stenosis. The anterior cerebral arteries are patent. No intracranial aneurysm is identified. Posterior circulation: The intracranial vertebral arteries are patent. The right vertebral artery is developmentally diminutive beyond the PICA origin, but patent. The basilar artery is patent. The posterior cerebral arteries are patent. A right posterior communicating artery is present. The left posterior communicating artery is diminutive or absent. Anatomic variants: As described. MRA NECK FINDINGS Mild to moderately motion degraded exam. Aortic arch: Standard aortic branching. The visualized aortic arch is normal in caliber. No hemodynamically significant innominate or proximal subclavian artery stenosis. Right carotid system: The common carotid and internal carotid arteries are patent within the neck. Apparent atherosclerotic narrowing within the proximal ICA of up to 70%. Motion artifact limits evaluation of the proximal CCA. Within this limitation, no other hemodynamically significant stenosis is identified. Left carotid system: CCA and ICA patent within the neck. Less than 50% atherosclerotic narrowing of the proximal ICA. Motion degradation limits evaluation of the proximal CCA. Within this limitation, no hemodynamically significant stenosis is identified elsewhere within the left carotid system within the neck. Vertebral arteries: Vertebral arteries patent within the neck. Apparent high-grade stenoses within the proximal V1 segments bilaterally. Other: None IMPRESSION:  MRI brain: 1. Intermittently motion degraded exam. 2. 2 mm acute/early subacute infarct within the left corona radiata. 3. 2 mm acute/early subacute infarct within the left basal ganglia. 4. Small nonspecific T2 FLAIR hyperintense remote insult within the right frontoparietal white matter. 5. Otherwise unremarkable non-contrast MRI appearance of the brain. 6.  Small-volume fluid within the right mastoid air cells. MRA head: 1. Mild-to-moderately motion degraded exam. 2. No intracranial large vessel occlusion or proximal high-grade arterial stenosis identified. MRA neck: 1. Motion degraded exam. 2. The common carotid and internal carotid arteries are patent within the neck. There is apparent atherosclerotic narrowing of the proximal right ICA of up to 70%. However, it is possible that this stenosis is exaggerated by motion artifact on the current exam. Consider a CTA of the neck for further evaluation. 3. Vertebral arteries patent within the neck. There are apparent severe stenoses at the origins of the both vertebral arteries. However, these apparent stenoses could be exaggerated by motion artifact on the current exam. Consider a CTA of the neck for further evaluation. Electronically Signed   By: Jackey Loge D.O.   On: 08/02/2022 15:36   MR ANGIO NECK W WO CONTRAST  Result Date: 08/02/2022 CLINICAL DATA:  Provided history: Neuro deficit, acute, stroke suspected. Altered mental status. EXAM: MRI HEAD WITHOUT CONTRAST MRA HEAD WITHOUT CONTRAST MRA NECK WITHOUT AND WITH CONTRAST TECHNIQUE: Multiplanar, multi-echo pulse sequences of the brain and surrounding structures were acquired without intravenous contrast. Angiographic images of the Circle of Willis were acquired using MRA technique without intravenous contrast. Angiographic images of the neck were acquired using MRA technique without and with intravenous contrast. Carotid stenosis measurements (when applicable) are obtained utilizing NASCET criteria, using the distal internal carotid diameter as the denominator. CONTRAST:  9mL GADAVIST GADOBUTROL 1 MMOL/ML IV SOLN COMPARISON:  Head CT 08/02/2022 FINDINGS: MRI HEAD FINDINGS Intermittently motion degraded examination (with up to moderate motion degradation of the acquired sequences). Brain: No age advanced or lobar predominant parenchymal atrophy. 2 mm focus of  restricted diffusion within the left corona radiata consistent with an acute/early subacute infarct (series 5, image 35) (series 7, image 20). 2 mm focus of restricted diffusion within the left lentiform nucleus consistent with an acute/early subacute infarct (series 5, image 28) (series 7, image 25). Apparent tiny foci of diffusion-weighted signal abnormality within the right corona radiata appreciated on the axial diffusion-weighted sequence only, not appreciated on the coronal diffusion-weighted sequence, and likely artifactual. Small nonspecific T2 FLAIR hyperintense remote insult within the right frontoparietal white matter (with associated wallerian degeneration extending inferiorly within the cortical spinal tract). No evidence of an intracranial mass. No chronic intracranial blood products. No extra-axial fluid collection. No midline shift. Vascular: Maintained flow voids within the proximal large arterial vessels. Skull and upper cervical spine: No focal suspicious marrow lesion. Sinuses/Orbits: No mass or acute finding within the imaged orbits. Minimal mucosal thickening within the left ethmoid and bilateral sphenoid sinuses. Other: Small-volume fluid within the right mastoid air cells. MRA HEAD FINDINGS Mild to moderately motion degraded exam. Anterior circulation: ICA The M1 middle cerebral arteries are patent. No M2 proximal branch occlusion or high-grade proximal stenosis. The anterior cerebral arteries are patent. No intracranial aneurysm is identified. Posterior circulation: The intracranial vertebral arteries are patent. The right vertebral artery is developmentally diminutive beyond the PICA origin, but patent. The basilar artery is patent. The posterior cerebral arteries are patent. A right posterior communicating artery is present. The left posterior communicating artery is diminutive or  absent. Anatomic variants: As described. MRA NECK FINDINGS Mild to moderately motion degraded exam. Aortic  arch: Standard aortic branching. The visualized aortic arch is normal in caliber. No hemodynamically significant innominate or proximal subclavian artery stenosis. Right carotid system: The common carotid and internal carotid arteries are patent within the neck. Apparent atherosclerotic narrowing within the proximal ICA of up to 70%. Motion artifact limits evaluation of the proximal CCA. Within this limitation, no other hemodynamically significant stenosis is identified. Left carotid system: CCA and ICA patent within the neck. Less than 50% atherosclerotic narrowing of the proximal ICA. Motion degradation limits evaluation of the proximal CCA. Within this limitation, no hemodynamically significant stenosis is identified elsewhere within the left carotid system within the neck. Vertebral arteries: Vertebral arteries patent within the neck. Apparent high-grade stenoses within the proximal V1 segments bilaterally. Other: None IMPRESSION: MRI brain: 1. Intermittently motion degraded exam. 2. 2 mm acute/early subacute infarct within the left corona radiata. 3. 2 mm acute/early subacute infarct within the left basal ganglia. 4. Small nonspecific T2 FLAIR hyperintense remote insult within the right frontoparietal white matter. 5. Otherwise unremarkable non-contrast MRI appearance of the brain. 6. Small-volume fluid within the right mastoid air cells. MRA head: 1. Mild-to-moderately motion degraded exam. 2. No intracranial large vessel occlusion or proximal high-grade arterial stenosis identified. MRA neck: 1. Motion degraded exam. 2. The common carotid and internal carotid arteries are patent within the neck. There is apparent atherosclerotic narrowing of the proximal right ICA of up to 70%. However, it is possible that this stenosis is exaggerated by motion artifact on the current exam. Consider a CTA of the neck for further evaluation. 3. Vertebral arteries patent within the neck. There are apparent severe stenoses at  the origins of the both vertebral arteries. However, these apparent stenoses could be exaggerated by motion artifact on the current exam. Consider a CTA of the neck for further evaluation. Electronically Signed   By: Jackey Loge D.O.   On: 08/02/2022 15:36   CT HEAD WO CONTRAST ( )  Result Date: 08/02/2022 CLINICAL DATA:  Provided history: Neuro deficit, acute, stroke suspected. EXAM: CT HEAD WITHOUT CONTRAST TECHNIQUE: Contiguous axial images were obtained from the base of the skull through the vertex without intravenous contrast. RADIATION DOSE REDUCTION: This exam was performed according to the departmental dose-optimization program which includes automated exposure control, adjustment of the mA and/or kV according to patient size and/or use of iterative reconstruction technique. COMPARISON:  No pertinent prior exams available for comparison. FINDINGS: Brain: No age advanced or lobar predominant parenchymal atrophy. Mild patchy and ill-defined hypoattenuation within the cerebral white matter, nonspecific but most often secondary to chronic small vessel ischemia. Partially empty sella turcica. There is no acute intracranial hemorrhage. No demarcated cortical infarct. No extra-axial fluid collection. No evidence of an intracranial mass. No midline shift. Vascular: No hyperdense vessel. Atherosclerotic calcifications. Skull: No fracture or aggressive osseous lesion. Sinuses/Orbits: No mass or acute finding within the imaged orbits. Mild mucosal thickening within the bilateral sphenoid, bilateral ethmoid and left frontal sinuses. Other: Small-volume fluid within the right mastoid air cells. IMPRESSION: 1. No evidence of acute intracranial hemorrhage or acute infarct. 2. Mild cerebral white matter disease, nonspecific but most often secondary to chronic small vessel ischemia. 3. Partially empty sella turcica. This finding can reflect incidental anatomic variation, or alternatively, it can be associated with  idiopathic intracranial hypertension (pseudotumor cerebri). 4. Mild paranasal sinus mucosal thickening. 5. Small-volume fluid within the right mastoid air cells. Electronically Signed  By: Jackey Loge D.O.   On: 08/02/2022 11:11   (Echo, Carotid, EGD, Colonoscopy, ERCP)    Subjective: Patient seen in the morning rounds.  No overnight events.  She is able to walk around well.  She is feeling back to her baseline but she was very upset about not able to get the surgery. We also did not carry some of her psychiatric medication in the hospital and she feels emotional. She will see Dr. Raynelle Jan tomorrow for her routine follow-up. I called and updated Dr. Belinda Block office about her current clinical situation.    Discharge Exam: Vitals:   08/04/22 0531 08/04/22 0725  BP: (!) 175/56 (!) 177/103  Pulse: 69 73  Resp: 17 12  Temp: 98.2 F (36.8 C) 97.9 F (36.6 C)  SpO2: 97% 96%   Vitals:   08/03/22 2029 08/03/22 2350 08/04/22 0531 08/04/22 0725  BP: (!) 168/99 (!) 183/98 (!) 175/56 (!) 177/103  Pulse: 77 78 69 73  Resp: 19 18 17 12   Temp: 98 F (36.7 C) 98.3 F (36.8 C) 98.2 F (36.8 C) 97.9 F (36.6 C)  TempSrc:      SpO2: 95% 98% 97% 96%  Weight:      Height:        General: Pt is alert, awake, not in acute distress Cardiovascular: RRR, S1/S2 +, no rubs, no gallops Respiratory: CTA bilaterally, no wheezing, no rhonchi Abdominal: Soft, NT, ND, bowel sounds + Extremities: no edema, no cyanosis    The results of significant diagnostics from this hospitalization (including imaging, microbiology, ancillary and laboratory) are listed below for reference.     Microbiology: No results found for this or any previous visit (from the past 240 hour(s)).   Labs: BNP (last 3 results) No results for input(s): "BNP" in the last 8760 hours. Basic Metabolic Panel: Recent Labs  Lab 08/02/22 1025  NA 132*  K 4.1  CL 103  CO2 21*  GLUCOSE 132*  BUN 15  CREATININE 1.36*   CALCIUM 8.7*   Liver Function Tests: No results for input(s): "AST", "ALT", "ALKPHOS", "BILITOT", "PROT", "ALBUMIN" in the last 168 hours. No results for input(s): "LIPASE", "AMYLASE" in the last 168 hours. No results for input(s): "AMMONIA" in the last 168 hours. CBC: Recent Labs  Lab 08/02/22 1025  WBC 10.3  HGB 12.1  HCT 37.0  MCV 98.7  PLT 203   Cardiac Enzymes: No results for input(s): "CKTOTAL", "CKMB", "CKMBINDEX", "TROPONINI" in the last 168 hours. BNP: Invalid input(s): "POCBNP" CBG: No results for input(s): "GLUCAP" in the last 168 hours. D-Dimer No results for input(s): "DDIMER" in the last 72 hours. Hgb A1c No results for input(s): "HGBA1C" in the last 72 hours. Lipid Profile Recent Labs    08/03/22 0352  CHOL 127  HDL 30*  LDLCALC 58  TRIG 161*  CHOLHDL 4.2   Thyroid function studies No results for input(s): "TSH", "T4TOTAL", "T3FREE", "THYROIDAB" in the last 72 hours.  Invalid input(s): "FREET3" Anemia work up No results for input(s): "VITAMINB12", "FOLATE", "FERRITIN", "TIBC", "IRON", "RETICCTPCT" in the last 72 hours. Urinalysis    Component Value Date/Time   COLORURINE YELLOW 08/02/2022 1025   APPEARANCEUR CLEAR 08/02/2022 1025   LABSPEC >1.030 (H) 08/02/2022 1025   PHURINE 5.5 08/02/2022 1025   GLUCOSEU NEGATIVE 08/02/2022 1025   HGBUR MODERATE (A) 08/02/2022 1025   BILIRUBINUR NEGATIVE 08/02/2022 1025   BILIRUBINUR neg 08/19/2015 1201   KETONESUR NEGATIVE 08/02/2022 1025   PROTEINUR NEGATIVE 08/02/2022 1025   UROBILINOGEN  negative 08/19/2015 1201   UROBILINOGEN 0.2 06/07/2014 1000   NITRITE NEGATIVE 08/02/2022 1025   LEUKOCYTESUR TRACE (A) 08/02/2022 1025   Sepsis Labs Recent Labs  Lab 08/02/22 1025  WBC 10.3   Microbiology No results found for this or any previous visit (from the past 240 hour(s)).   Time coordinating discharge:  35 minutes  SIGNED:   Dorcas Carrow, MD  [facility] 08/04/2022, 11:42 AM

## 2022-08-04 NOTE — Plan of Care (Signed)
  Problem: Education: Goal: Knowledge of disease or condition will improve Outcome: Progressing Goal: Knowledge of secondary prevention will improve (MUST DOCUMENT ALL) Outcome: Progressing Goal: Knowledge of patient specific risk factors will improve Loraine Leriche N/A or DELETE if not current risk factor) Outcome: Progressing   Problem: Ischemic Stroke/TIA Tissue Perfusion: Goal: Complications of ischemic stroke/TIA will be minimized Outcome: Progressing   Problem: Coping: Goal: Will verbalize positive feelings about self Outcome: Progressing Goal: Will identify appropriate support needs Outcome: Progressing   Problem: Health Behavior/Discharge Planning: Goal: Ability to manage health-related needs will improve Outcome: Progressing Goal: Goals will be collaboratively established with patient/family Outcome: Progressing   Problem: Self-Care: Goal: Ability to participate in self-care as condition permits will improve Outcome: Progressing Goal: Verbalization of feelings and concerns over difficulty with self-care will improve Outcome: Progressing Goal: Ability to communicate needs accurately will improve Outcome: Progressing   Problem: Nutrition: Goal: Risk of aspiration will decrease Outcome: Progressing Goal: Dietary intake will improve Outcome: Progressing   Problem: Education: Goal: Knowledge of General Education information will improve Description: Including pain rating scale, medication(s)/side effects and non-pharmacologic comfort measures Outcome: Progressing   Problem: Clinical Measurements: Goal: Ability to maintain clinical measurements within normal limits will improve Outcome: Progressing Goal: Will remain free from infection Outcome: Progressing Goal: Diagnostic test results will improve Outcome: Progressing Goal: Respiratory complications will improve Outcome: Progressing Goal: Cardiovascular complication will be avoided Outcome: Progressing   Problem:  Activity: Goal: Risk for activity intolerance will decrease Outcome: Progressing   Problem: Nutrition: Goal: Adequate nutrition will be maintained Outcome: Progressing   Problem: Coping: Goal: Level of anxiety will decrease Outcome: Progressing   Problem: Elimination: Goal: Will not experience complications related to bowel motility Outcome: Progressing Goal: Will not experience complications related to urinary retention Outcome: Progressing   Problem: Pain Managment: Goal: General experience of comfort will improve Outcome: Progressing   Problem: Safety: Goal: Ability to remain free from injury will improve Outcome: Progressing   Problem: Skin Integrity: Goal: Risk for impaired skin integrity will decrease Outcome: Progressing

## 2022-08-04 NOTE — TOC Transition Note (Signed)
Transition of Care [facility]) - CM/SW Discharge Note   Patient Details  Name: Kylie Wright MRN: [id number] Date of Birth: 09/13/1965  [facility] CM/SW Contact:  Garret Reddish, RN Phone Number: 08/04/2022, 3:29 PM   Clinical Narrative:  Chart reviewed.  Noted that patient has orders for discharge today. I have spoken with patient about the need for [facility] PT and OT on discharge. Patient did not have a home health preference.  I have asked [place] with [facility] to accept home care referral.    I have spoken with Danielle with [employer] and she has provided patient a rolling walker at bedside.   I have also provided patient with a Psychologist, occupational.    I have informed staff nurse of the above information.       Final next level of care: Home w Home Health Services Barriers to Discharge: No Barriers Identified   Patient Goals and CMS Choice   Choice offered to / list presented to : Patient  Discharge Placement                      Patient and family notified of of transfer: 08/04/22  Discharge Plan and Services Additional resources added to the After Visit Summary for                  DME Arranged: Walker rolling DME Agency: [facility] Date DME Agency Contacted: 08/04/22 Time DME Agency Contacted: 1000 Representative spoke with at DME Agency: [place] [facility] Arranged: PT, OT HH Agency: [facility] Date [facility] Agency Contacted: 08/04/22 Time HH Agency Contacted: 1000 Representative spoke with at [facility] Agency: [place]  Social Determinants of Health (SDOH) Interventions SDOH Screenings   Food Insecurity: No Food Insecurity (08/02/2022)  Housing: Low Risk  (08/02/2022)  Transportation Needs: No Transportation Needs (08/02/2022)  Utilities: Not At Risk (08/02/2022)  Depression (PHQ2-9): Low Risk  (05/20/2022)  Financial Resource Strain: High Risk (11/15/2021)  Tobacco Use: Medium Risk (08/02/2022)     Readmission Risk Interventions     No data to  display

## 2022-08-04 NOTE — Telephone Encounter (Signed)
Patient is calling back with concerns in regards to this message with Shamea:  Pt sister called in to see if Dr. Izora Ribas would fill out FMLA paperwork. Advised that if pt has surgery next week at [facility] that office would fill out FMLA paperwork.  If surgery is postponed pt should reach out to neurology to have paperwork filled out.  Cardiology is not the reason for pt hospitalization so we wouldn't be the best office to fill out paperwork. Advised to call back if she has further questions or concerns.

## 2022-08-05 ENCOUNTER — Encounter: Payer: Self-pay | Admitting: Internal Medicine

## 2022-08-05 ENCOUNTER — Ambulatory Visit: Payer: Medicaid Other | Attending: Internal Medicine | Admitting: Internal Medicine

## 2022-08-05 ENCOUNTER — Telehealth: Payer: Self-pay

## 2022-08-05 VITALS — BP 150/88 | HR 80 | Ht 62.0 in | Wt 198.0 lb

## 2022-08-05 DIAGNOSIS — I421 Obstructive hypertrophic cardiomyopathy: Secondary | ICD-10-CM | POA: Insufficient documentation

## 2022-08-05 DIAGNOSIS — E7849 Other hyperlipidemia: Secondary | ICD-10-CM | POA: Insufficient documentation

## 2022-08-05 DIAGNOSIS — I10 Essential (primary) hypertension: Secondary | ICD-10-CM | POA: Diagnosis not present

## 2022-08-05 DIAGNOSIS — I639 Cerebral infarction, unspecified: Secondary | ICD-10-CM | POA: Diagnosis not present

## 2022-08-05 NOTE — Progress Notes (Signed)
Cardiology Office Note:    Date:  08/05/2022   ID:  Kylie Wright, DOB December 08, 1965, MRN [id number]  PCP:  Pincus Sanes, MD   [facility] Providers Cardiologist:  Christell Constant, MD     Referring MD: Pincus Sanes, MD   CC: HCM follow up  History of Present Illness:    Kylie Wright is a 57 y.o. female with a hx of HOCM seen by Dr. Wyline Mood and Graciela Husbands who presents for initiation of mavacamten.  Seen in follow up after changes led to [facility] discontinuation. 2023:  Post mavacamten start, patient had improved symptoms.    NYHA I on therapy. She noticed handfuls of hair falling out on mavacamten; this stopped after stopping the medication.  We stopped the medication for this reason but also because she lost her insurance.  She is pending [place] assist and disability.  We started disopyramide with improved symptoms.  She was able to get insurance and disability and is seen today for discussions of therapy. 2024: She had TEE, LHC, and RHC.  She has been referred to [facility]. She called in with worsening symptoms in interval. She was planned for Septal Myectomy Monday: found to have subacute stroke wit  Patient notes no change in her  SOB Notes more fatigue, she is recovering from surgery. Notes no palpitations Notes no CP. Notes stable Dizziness. Notes no syncope.  No evidence of atrial fibrillation.  Sub-acute stroke.   Past Medical History:  Diagnosis Date   ADHD (attention deficit hyperactivity disorder)    Bipolar 1 disorder (HCC)    History of alcohol abuse    History of drug abuse (HCC)    Interstitial cystitis    Other hypertrophic cardiomyopathy (HCC)     Past Surgical History:  Procedure Laterality Date   ADENOIDECTOMY  1971   APPENDECTOMY     RIGHT/LEFT HEART CATH AND CORONARY ANGIOGRAPHY N/A 03/30/2022   Procedure: RIGHT/LEFT HEART CATH AND CORONARY ANGIOGRAPHY;  Surgeon: Lennette Bihari, MD;  Location: [facility] INVASIVE CV LAB;  Service: Cardiovascular;   Laterality: N/A;   SALPINGECTOMY  1993   TEE WITHOUT CARDIOVERSION N/A 03/30/2022   Procedure: TRANSESOPHAGEAL ECHOCARDIOGRAM (TEE);  Surgeon: Christell Constant, MD;  Location: [facility] ENDOSCOPY;  Service: Cardiovascular;  Laterality: N/A;   vaginal surgery      Current Medications: Current Meds  Medication Sig   amphetamine-dextroamphetamine (ADDERALL XR) 20 MG 24 hr capsule Take 20 mg by mouth every morning.   aspirin EC 81 MG tablet Take 1 tablet (81 mg total) by mouth daily. Swallow whole.   cephALEXin (KEFLEX) 500 MG capsule Take 500 mg by mouth 2 (two) times daily.   clopidogrel (PLAVIX) 75 MG tablet Take 1 tablet (75 mg total) by mouth daily for 21 days.   disopyramide (NORPACE) 100 MG capsule Take 1 capsule (100 mg total) by mouth 4 (four) times daily.   docusate sodium (COLACE) 100 MG capsule Take 100 mg by mouth daily as needed for moderate constipation or mild constipation. Can take up to 3 tabs per day as needed   meclizine (DRAMAMINE) 25 MG tablet Take 25 mg by mouth 3 (three) times daily as needed for dizziness.   Melatonin 10 MG TABS Take 10 mg by mouth at bedtime.   meloxicam (MOBIC) 7.5 MG tablet TAKE 1 TABLET BY MOUTH TWICE  DAILY AS NEEDED FOR PAIN (Patient taking differently: Take 7.5 mg by mouth daily.)   metoprolol succinate (TOPROL XL) 25 MG 24 hr  tablet Take 1 tablet (25 mg total) by mouth daily.   OLANZapine-Samidorphan 5-10 MG TABS Take 1 tablet by mouth daily.   Olopatadine HCl (PAZEO) 0.7 % SOLN Place 1 drop into both eyes daily as needed (allergies).   polyethylene glycol (MIRALAX / GLYCOLAX) 17 g packet Take 17 g by mouth daily as needed for severe constipation, moderate constipation or mild constipation.   potassium chloride (KLOR-CON M) 10 MEQ tablet Take 10 mEq by mouth daily.   ROCKLATAN 0.02-0.005 % SOLN SMARTSIG:1 Drop(s) In Eye(s) Every Evening   rosuvastatin (CRESTOR) 10 MG tablet TAKE 1 TABLET (10 MG TOTAL) BY MOUTH DAILY.   solifenacin (VESICARE) 10  MG tablet Take 10 mg by mouth daily.   spironolactone (ALDACTONE) 25 MG tablet Take 1 tablet (25 mg total) by mouth daily.     Allergies:   Mavacamten   Social History   Socioeconomic History   Marital status: Single    Spouse name: Not on file   Number of children: 1   Years of education: Not on file   Highest education level: Not on file  Occupational History    Employer: [employer]  Tobacco Use   Smoking status: Former    Packs/day: .5    Types: Cigarettes    Quit date: 03/31/2022    Years since quitting: 0.3   Smokeless tobacco: Never  Substance and Sexual Activity   Alcohol use: No    Alcohol/week: 0.0 standard drinks of alcohol   Drug use: No    Comment: h/o abuse hasn't used since 2002   Sexual activity: Not Currently    Partners: Male    Birth control/protection: None  Other Topics Concern   Not on file  Social History Narrative   Exercise:  Walks a lot, tries to [employer]    Social Determinants of Health   Financial Resource Strain: High Risk (11/15/2021)   Overall Financial Resource Strain (CARDIA)    Difficulty of Paying Living Expenses: Hard  Food Insecurity: No Food Insecurity (08/02/2022)   Hunger Vital Sign    Worried About Running Out of Food in the Last Year: Never true    Ran Out of Food in the Last Year: Never true  Transportation Needs: No Transportation Needs (08/02/2022)   PRAPARE - Administrator, Civil Service (Medical): No    Lack of Transportation (Non-Medical): No  Physical Activity: Not on file  Stress: Not on file  Social Connections: Not on file    Social: Formerly very active- worked at [employer], worked in Print production planner, 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  ROS:   Please see the history of present illness.     All other systems reviewed and are negative.  EKGs/Labs/Other Studies Reviewed:    The following studies were reviewed today: 05/04/22: Sinus bradycardia QRS duration 88 with iRBBB, LVH with secondary repolarization  11/15/21: Sinus bradycardia one PAC  Cardiac Studies & Procedures   CARDIAC CATHETERIZATION  CARDIAC CATHETERIZATION 03/30/2022  Narrative   Ost LAD to Prox LAD lesion is 5% stenosed.  Essentially  normal coronary arteries with a dominant left circumflex system.  There is minimal irregularity in the proximal LAD.  Upper normal right heart pressures with no evidence for pulmonary hypertension.  HOCM documented by echocardiography with significant asymmetric left ventricular hypertrophy and LVOT gradient of 27 mm.  Patient will follow-up with Dr. Richarda Blade.  Findings Coronary Findings Diagnostic  Dominance: Co-dominant  Left Anterior Descending Ost LAD to Prox LAD lesion is 5% stenosed.  Intervention  No interventions have been documented.   STRESS TESTS  ECHOCARDIOGRAM STRESS TEST 12/10/2021  Narrative EXERCISE STRESS ECHO REPORT   --------------------------------------------------------------------------------  [name]  Date of Exam: 05/27/2021 Medical Rec #:  [id number]     Height:       62.0 in Accession #:    [id number]    Weight:       193.9 lb Date of Birth:  12-21-1965      BSA:          1.887 m Patient Age:    55 years      BP:           128/82 mmHg Patient Gender: F             HR:           54 bpm. Exam Location:  [address]  Procedure: Stress Echo, Color Doppler and Cardiac Doppler  Indications:    I42.2 HOCM  History:        Patient has prior history of Echocardiogram examinations,  most recent 10/16/2020.  Sonographer:    Cathie Beams RCS Referring Phys: [id number] [facility] A Latravia [place]  IMPRESSIONS   1. This is an inconclusive stress echocardiogram for ischemia. Target heart rate not achieved and stress images of LVF not acquired. 2. The findings of the study demonstrate that a stress-induced outflow tract obstruction is present (peak LVOT gradient 63.2 mmHG).  FINDINGS  Exam Protocol:   Patient Performance: The patient exercised for 3 minutes and 15 seconds, achieving 5 METS. The baseline heart rate was 54 bpm. The heart rate at peak stress was 86 bpm. The target heart rate was calculated to be 139 bpm. The percentage of maximum predicted heart rate achieved was 52.6 %. The baseline blood pressure was 128/82 mmHg. The blood pressure at peak stress was 151/80 mmHg. The blood pressure response was normal. The patient developed shortness of breath and fatigue during the stress exam. The patient's functional capacity was below average.  EKG: Resting EKG showed sinus bradycardia with no abnormal findings. The patient developed no abnormal EKG findings during exercise. There was 0.5 mm of horizontal ST segment depression in lead(s) II, III and AVF that resolved in less than 1 minute(s) into recovery.   2D Echo Findings: The baseline ejection fraction was 70%. Baseline regional wall motion abnormalities were not present. This is an inconclusive stress echocardiogram for ischemia.  Stress Doppler:  LVOT: The baseline LVOT gradient was 44 mmHG. The peak LVOT gradient at stress was 63.2 mmHG. The findings of the study demonstrate that a stress-induced outflow tract obstruction is present.  Mitral Regurgitation: At baseline, mild mitral regurgitation was present.   Armanda Magic MD Electronically signed on 05/28/2021 at 2:40:18 PM     Final   ECHOCARDIOGRAM  ECHOCARDIOGRAM COMPLETE 08/03/2022  Narrative ECHOCARDIOGRAM REPORT    [name] Date of Exam: 08/03/2022 Medical Rec #:  [id number]      Height:       62.0  in Accession #:    [id number]     Weight:       198.0 lb Date of Birth:  1965/09/17       BSA:          1.904 m Patient Age:    56 years       BP:           149/89 mmHg Patient Gender: F              HR:           70 bpm. Exam Location:  [facility]  Procedure: 2D Echo, Cardiac Doppler and Color Doppler  Indications:     Stroke  History:         Patient has prior history of Echocardiogram examinations, most recent 03/21/2022. Stroke; Risk Factors:Hypertension, Dyslipidemia and Former Smoker. HOCM-Scheduled for surgery at [facility] on 08/08/22.  Sonographer:     Mikki Harbor Referring Phys:  [id number] [facility] Diagnosing Phys: Debbe Odea MD  IMPRESSIONS   1. There is severe asymmetric septal LV wall thickness, septal wall measuring upto 1.9cm. there is dynamic LVOT obstruction. Findings consistent with obstructive HCM. Marland Kitchen Left ventricular ejection fraction, by estimation, is 65 to 70%. The left ventricle has normal function. The left ventricle has no regional wall motion abnormalities. There is severe asymmetric left ventricular hypertrophy. Left ventricular diastolic parameters are consistent with Grade I diastolic dysfunction (impaired relaxation). 2. Right ventricular systolic function is normal. The right ventricular size is normal. 3. The mitral valve is normal in structure. Mild to moderate mitral valve regurgitation. 4. The aortic valve was not well visualized. Aortic valve regurgitation is mild. 5. The inferior vena cava is normal in size with greater than 50% respiratory variability, suggesting right atrial pressure of 3 mmHg.  FINDINGS Left Ventricle: There is severe asymmetric septal LV wall thickness, septal wall measuring upto 1.9cm. there is dynamic LVOT obstruction. Findings consistent with obstructive HCM. Left ventricular ejection fraction, by estimation, is 65 to 70%. The left ventricle has  normal function. The left ventricle has no regional wall motion abnormalities. The left ventricular internal cavity size was small. There is severe asymmetric left ventricular hypertrophy. Left ventricular diastolic parameters are consistent with Grade I diastolic dysfunction (impaired relaxation).  Right Ventricle: The right ventricular size is normal. No increase in right ventricular wall thickness. Right ventricular systolic function is normal.  Left Atrium: Left atrial size was normal in size.  Right Atrium: Right atrial size was normal in size.  Pericardium: There is no evidence of pericardial effusion.  Mitral Valve: The mitral valve is normal in structure. Mild to moderate mitral valve regurgitation, with posteriorly-directed jet. MV peak gradient, 13.4 mmHg. The mean mitral valve gradient is 4.0 mmHg.  Tricuspid Valve: The tricuspid valve is normal in structure. Tricuspid valve regurgitation is not demonstrated.  Aortic Valve: The aortic valve was not well visualized. Aortic valve regurgitation is mild. Aortic valve mean gradient measures 9.5 mmHg. Aortic valve peak gradient measures 20.9 mmHg. Aortic valve area, by VTI measures 2.28 cm.  Pulmonic Valve: The pulmonic valve was not well visualized. Pulmonic valve regurgitation is not visualized.  Aorta: The aortic root is normal in size and structure.  Venous: The inferior vena cava is normal in size with greater than 50% respiratory variability, suggesting right atrial pressure of 3 mmHg.  IAS/Shunts: No atrial level shunt detected by color flow Doppler.   LEFT VENTRICLE PLAX 2D LVIDd:  3.00 cm   Diastology LVIDs:         1.80 cm   LV e' medial:    6.42 cm/s LV PW:         1.80 cm   LV E/e' medial:  10.5 LV IVS:        2.00 cm   LV e' lateral:   7.18 cm/s LVOT diam:     1.90 cm   LV E/e' lateral: 9.4 LV SV:         113 LV SV Index:   60 LVOT Area:     2.84 cm   RIGHT VENTRICLE RV Basal diam:  2.80 cm RV Mid  diam:    2.50 cm RV S prime:     12.10 cm/s TAPSE (M-mode): 2.4 cm  LEFT ATRIUM             Index        RIGHT ATRIUM           Index LA diam:        4.20 cm 2.21 cm/m   RA Area:     13.10 cm LA Vol (A2C):   44.1 ml 23.17 ml/m  RA Volume:   24.40 ml  12.82 ml/m LA Vol (A4C):   50.7 ml 26.63 ml/m LA Biplane Vol: 48.8 ml 25.64 ml/m AORTIC VALVE                     PULMONIC VALVE AV Area (Vmax):    2.51 cm      PV Vmax:       1.48 m/s AV Area (Vmean):   2.42 cm      PV Peak grad:  8.8 mmHg AV Area (VTI):     2.28 cm AV Vmax:           228.50 cm/s AV Vmean:          139.500 cm/s AV VTI:            0.497 m AV Peak Grad:      20.9 mmHg AV Mean Grad:      9.5 mmHg LVOT Vmax:         202.00 cm/s LVOT Vmean:        119.000 cm/s LVOT VTI:          0.399 m LVOT/AV VTI ratio: 0.80  AORTA Ao Root diam: 3.00 cm Ao Asc diam:  2.70 cm  MITRAL VALVE MV Area (PHT): 1.72 cm     SHUNTS MV Area VTI:   3.14 cm     Systemic VTI:  0.40 m MV Peak grad:  13.4 mmHg    Systemic Diam: 1.90 cm MV Mean grad:  4.0 mmHg MV Vmax:       1.83 m/s MV Vmean:      96.0 cm/s MV Decel Time: 440 msec MV E velocity: 67.60 cm/s MV A velocity: 145.00 cm/s MV E/A ratio:  0.47  Debbe Odea MD Electronically signed by Debbe Odea MD Signature Date/Time: 08/03/2022/1:19:15 PM    Final   TEE  ECHO TEE 03/30/2022  Narrative TRANSESOPHOGEAL ECHO REPORT    [name] Date of Exam: 03/30/2022 Medical Rec #:  [id number]      Height:       62.0 in Accession #:    [id number]     Weight:       194.0 lb Date of Birth:  12/14/1965       BSA:  1.887 m Patient Age:    56 years       BP:           148/69 mmHg Patient Gender: F              HR:           47 bpm. Exam Location:  Outpatient  Procedure: Transesophageal Echo, 3D Echo, Color Doppler and Cardiac Doppler  Indications:     hypertrophic obstructive cardiomyopathy  History:         Patient has prior history of  Echocardiogram examinations, most recent 07/15/2021. Arrythmias:Bradycardia; Risk Factors:Hypertension, Dyslipidemia and Current Smoker.  Sonographer:     Delcie Roch RDCS Referring Phys:  [id number] [facility] A Izora Ribas Diagnosing Phys: Riley Lam MD  PROCEDURE: After discussion of the risks and benefits of a TEE, an informed consent was obtained from the patient. The transesophogeal probe was passed without difficulty through the esophogus of the patient. Imaged were obtained with the patient in a left lateral decubitus position. Sedation performed by different physician. The patient was monitored while under deep sedation. Anesthestetic sedation was provided intravenously by Anesthesiology: 265mg  of Propofol, 20mg  of Lidocaine. The patient developed no complications during the procedure.  IMPRESSIONS   1. LVOT Gradient of 27 mm Hg. Left ventricular ejection fraction, by estimation, is 65 to 70%. The left ventricle has normal function. There is severe asymmetric left ventricular hypertrophy of the septal segment. 2. Right ventricular systolic function is normal. The right ventricular size is normal. 3. Left atrial size was mildly dilated. No left atrial/left atrial appendage thrombus was detected. The LAA emptying velocity was 29 cm/s. 4. Right atrial size was mildly dilated. 5. Bileaflet prolapse with SAM. The mitral valve is abnormal. Mild to moderate mitral valve regurgitation. 6. The aortic valve is tricuspid. Aortic valve regurgitation is not visualized.  FINDINGS Left Ventricle: LVOT Gradient of 27 mm Hg. Left ventricular ejection fraction, by estimation, is 65 to 70%. The left ventricle has normal function. The left ventricular internal cavity size was normal in size. There is severe asymmetric left ventricular hypertrophy of the septal segment.  Right Ventricle: The right ventricular size is normal. No increase in right ventricular wall thickness. Right ventricular  systolic function is normal.  Left Atrium: Left atrial size was mildly dilated. No left atrial/left atrial appendage thrombus was detected. The LAA emptying velocity was 29 cm/s.  Right Atrium: Right atrial size was mildly dilated.  Pericardium: There is no evidence of pericardial effusion.  Mitral Valve: Bileaflet prolapse with SAM. The mitral valve is abnormal. Mild to moderate mitral valve regurgitation.  Tricuspid Valve: The tricuspid valve is normal in structure. Tricuspid valve regurgitation is not demonstrated. No evidence of tricuspid stenosis.  Aortic Valve: The aortic valve is tricuspid. Aortic valve regurgitation is not visualized.  Pulmonic Valve: The pulmonic valve was normal in structure. Pulmonic valve regurgitation is not visualized.  Aorta: The aortic root and ascending aorta are structurally normal, with no evidence of dilitation.  IAS/Shunts: No atrial level shunt detected by color flow Doppler.  Riley Lam MD Electronically signed by Riley Lam MD Signature Date/Time: 03/30/2022/6:13:27 PM    Final   MONITORS  LONG TERM MONITOR (3-14 DAYS) 05/22/2022  Narrative   Patient had a minimum heart rate of 36 bpm, maximum heart rate of 64 bpm, and average heart rate of 47 bpm.   Predominant underlying rhythm was sinus rhythm.   Junctional rhythm noted.   Isolated PACs were occasional (3%).  Isolated PVCs were rare (<1.0%).  Symptomatic bradycardia and junctional bradycardia noted.    CARDIAC MRI  MR CARDIAC MORPHOLOGY W WO CONTRAST 06/10/2022  Narrative CLINICAL DATA:  Clinical question of hypertrophic cardiomyopathy Study assumes HCT of 37.4 and BSA of 2.02 m2.  EXAM: CARDIAC MRI  TECHNIQUE: The patient was scanned on a 1.5 Tesla GE magnet. A dedicated cardiac coil was used. Functional imaging was done using Fiesta sequences. 2,3, and 4 chamber views were done to assess for RWMA's. Modified Simpson's rule using a short axis stack  was used to calculate an ejection fraction on a dedicated work [employer]. The patient received 13 cc of Gadavist. After 10 minutes inversion recovery sequences were used to assess for infiltration and scar tissue. Flow quantification was performed times during this examination with flow quantification performed at the levels of the ascending aorta above the valve. Difficult triggering exam despite no ventricular arrhythmia. Pulse triggering performed with suboptimal results. Pulmonic valve flow quantification not performed.  CONTRAST:  13 cc  of Gadavist  FINDINGS: 1. Normal left ventricular size, with LVEDD 51 mm.  Severe basal hypertrophy with septal thickness of 23 mm.  Qualitatively normal left ventricular systolic function (LVEF =65-70%). There are no regional wall motion abnormalities. There is chordal systolic anterior motion of the mitral valve with flow acceleration into the LVOT.  Left ventricular parametric mapping notable for normal ECV and T2.  There is no late gadolinium enhancement in the left ventricular myocardium.  2. Normal right ventricular size qualitatively.  Normal right ventricular thickness.  Normal right ventricular systolic function, qualitatively. There are no regional wall motion abnormalities or aneurysms.  3.  Normal right atrial size.  Left atrial size appears dilated.  4. Normal size of the aortic root, ascending aorta and pulmonary artery.  5. Valve assessment:  Aortic Valve: Tri-leaflet aortic valve. There is no significant regurgitation. Regurgitant fraction 2%. This was the only successful non-pulse triggered sequence  Pulmonic Valve: Qualitatively, there is no significant regurgitation.  Tricuspid Valve: Not well visualized.  Mitral Valve: Valve morphology not well visualized. Mild to moderate mitral regurgitation qualitatively.  6.  Normal pericardium.  No pericardial effusion.  7. Grossly, no  extracardiac findings. Recommended dedicated study if concerned for non-cardiac pathology.  8. Prominent pulse triggering artifacts noted. This decreased the sensitivity of volumetric assessments.  IMPRESSION: Technically difficult study.  In regard to the clinical question: Hypertrophic cardiomyopathy noted without high risk imaging features.  Riley Lam MD   Electronically Signed By: Riley Lam M.D. On: 06/10/2022 13:13         Recent Labs: 04/15/2022: ALT 12; TSH 4.99 08/02/2022: BUN 15; Creatinine, Ser 1.36; Hemoglobin 12.1; Platelets 203; Potassium 4.1; Sodium 132  Recent Lipid Panel    Component Value Date/Time   CHOL 127 08/03/2022 0352   TRIG 194 (H) 08/03/2022 0352   HDL 30 (L) 08/03/2022 0352   CHOLHDL 4.2 08/03/2022 0352   VLDL 39 08/03/2022 0352   LDLCALC 58 08/03/2022 0352        Physical Exam:    VS:  BP (!) 150/88   Pulse 80   Ht 5\' 2"  (1.575 m)   Wt 198 lb (89.8 kg)   SpO2 98%   BMI 36.21 kg/m     Wt Readings from Last 3 Encounters:  08/05/22 198 lb (89.8 kg)  08/02/22 198 lb (89.8 kg)  05/20/22 203 lb 4.8 oz (92.2 kg)    Gen: no distress  Neck: No JVD Cardiac: No Rubs or Gallops, harsh systolic murmur at rest, RRR +2 radial pulses Respiratory: Clear to auscultation bilaterally, normal effort, normal  respiratory rate GI: Soft, nontender, non-distended  MS: No edema;  moves all extremities Integument: Skin feels well Neuro:  At time of evaluation, alert and oriented to person/place/time/situation  Psych: Normal affect, patient feels OK  ASSESSMENT:    1. HOCM (hypertrophic obstructive cardiomyopathy) (HCC)   2. Primary hypertension   3. Other hyperlipidemia   4. Cerebrovascular accident (CVA), unspecified mechanism (HCC)     PLAN:    Hypertrophic Cardiomyopathy Stroke- sub acute with some risk factors for AF - NYHA III-IV - severe LVOT gradient (63 on stress echo in 2023; 49 mm Hg rest echo 07/2022) on  current therapy - she had a CMR for 06/09/2022 - will send to EP for ILR  - continue norpace for now - symptoms some what improved by transition to succinate 25 mg   - her surgery will be delayed, likely by 3 months; we have discussed with Dr. Regino Schultze but need an update of timing of new surgery - does not have insurance coverage for genetic testing  - had hair loss on mavacamten that resolved with cessation (this improved her sx dramatically)  - she has found a lot of benefit with the [facility] information we sent and joined an Facebook support group   Non HCM Cardiac Care HTN Stroke  Vertigo Former Smoker - she is followed by Dr. Tereso Newcomer and improved on dramamine as her Dr. Wyline Mood  Post SRT f/u with me         Medication Adjustments/Labs and Tests Ordered: Current medicines are reviewed at length with the patient today.  Concerns regarding medicines are outlined above.  No orders of the defined types were placed in this encounter.  No orders of the defined types were placed in this encounter.   Patient Instructions  Medication Instructions:  Your physician recommends that you continue on your current medications as directed. Please refer to the Current Medication list given to you today.  *If you need a refill on your cardiac medications before your next appointment, please call your pharmacy*  Lab Work: None ordered today.  Testing/Procedures: None ordered today.  Follow-Up: At [facility], you and your health needs are our priority.  As part of our continuing mission to provide you with exceptional heart care, we have created designated Provider Care Teams.  These Care Teams include your primary Cardiologist (physician) and Advanced Practice Providers (APPs -  Physician Assistants and Nurse Practitioners) who all work together to provide you with the care you need, when you need it.  Your next appointment:   4 month(s)  The format for your next appointment:   In  Person  Provider:   Christell Constant, MD {   Signed, Christell Constant, MD  08/05/2022 12:57 PM    [facility]

## 2022-08-05 NOTE — Patient Instructions (Addendum)
Medication Instructions:  Your physician recommends that you continue on your current medications as directed. Please refer to the Current Medication list given to you today.  *If you need a refill on your cardiac medications before your next appointment, please call your pharmacy*  Lab Work: None ordered today.  Testing/Procedures: None ordered today.  Follow-Up: At [facility], you and your health needs are our priority.  As part of our continuing mission to provide you with exceptional heart care, we have created designated Provider Care Teams.  These Care Teams include your primary Cardiologist (physician) and Advanced Practice Providers (APPs -  Physician Assistants and Nurse Practitioners) who all work together to provide you with the care you need, when you need it.  Your next appointment:   4 month(s)  The format for your next appointment:   In Person  Provider:   Christell Constant, MD {

## 2022-08-05 NOTE — Transitions of Care (Post Inpatient/ED Visit) (Signed)
08/05/2022  Name: Kylie Wright MRN: [id number] DOB: 01-03-1966  Today's TOC FU Call Status: Today's TOC FU Call Status:: Successful TOC FU Call Competed TOC FU Call Complete Date: 08/05/22  Transition Care Management Follow-up Telephone Call Date of Discharge: 08/02/22 Discharge Facility: [facility]) Type of Discharge: Inpatient Admission Primary Inpatient Discharge Diagnosis:: stroke Reason for ED Visit: Other: How have you been since you were released from the hospital?: Better Any questions or concerns?: No  Items Reviewed: Did you receive and understand the discharge instructions provided?: Yes Medications obtained,verified, and reconciled?: Yes (Medications Reviewed) Any new allergies since your discharge?: No Dietary orders reviewed?: No Do you have support at home?: Yes People in Home: child(ren), dependent Name of Support/Comfort Primary Source: co-workers  Medications Reviewed Today: Medications Reviewed Today     Reviewed by Annabell Sabal, CMA (Certified Medical Assistant) on 08/05/22 at [phone]  Med List Status: <None>   Medication Order Taking? Sig Documenting Provider Last Dose Status Informant  amphetamine-dextroamphetamine (ADDERALL XR) 20 MG 24 hr capsule [id number] Yes Take 20 mg by mouth every morning. [provider] Taking Active Self  aspirin EC 81 MG tablet [id number] Yes Take 1 tablet (81 mg total) by mouth daily. Swallow whole. Dorcas Carrow, MD Taking Active   clopidogrel (PLAVIX) 75 MG tablet [id number] Yes Take 1 tablet (75 mg total) by mouth daily for 21 days. Dorcas Carrow, MD Taking Active   disopyramide (NORPACE) 100 MG capsule [id number] Yes Take 1 capsule (100 mg total) by mouth 4 (four) times daily. Christell Constant, MD Taking Active Self  docusate sodium (COLACE) 100 MG capsule [id number] Yes Take 100 mg by mouth daily as needed for moderate constipation or mild constipation. Can take up to 3 tabs per  day as needed [provider] Taking Active Self  meclizine (DRAMAMINE) 25 MG tablet [id number] Yes Take 25 mg by mouth 3 (three) times daily as needed for dizziness. [provider] Taking Active Self  Melatonin 10 MG TABS [id number] Yes Take 10 mg by mouth at bedtime. [provider] Taking Active Self  meloxicam (MOBIC) 7.5 MG tablet [id number] Yes TAKE 1 TABLET BY MOUTH TWICE  DAILY AS NEEDED FOR PAIN  Patient taking differently: Take 7.5 mg by mouth daily.   Pincus Sanes, MD Taking Active Self  metoprolol succinate (TOPROL XL) 25 MG 24 hr tablet [id number] Yes Take 1 tablet (25 mg total) by mouth daily. Christell Constant, MD Taking Active   OLANZapine-Samidorphan 5-10 MG TABS [id number] Yes Take 1 tablet by mouth daily. [provider] Taking Active Self           Med Note Henreitta Leber, Shireen Quan   Wed May 04, 2022  8:39 AM)    Olopatadine HCl (PAZEO) 0.7 % SOLN [id number] Yes Place 1 drop into both eyes daily as needed (allergies). [provider] Taking Active Self  polyethylene glycol (MIRALAX / GLYCOLAX) 17 g packet [id number] Yes Take 17 g by mouth daily as needed for severe constipation, moderate constipation or mild constipation. [provider] Taking Active   potassium chloride (KLOR-CON M) 10 MEQ tablet [id number] Yes Take 10 mEq by mouth daily. [provider] Taking Active   ROCKLATAN 0.02-0.005 % SOLN [id number] Yes SMARTSIG:1 Drop(s) In Eye(s) Every Evening [provider] Taking Active   rosuvastatin (CRESTOR) 10 MG tablet [id number] Yes TAKE 1 TABLET (10 MG TOTAL) BY MOUTH DAILY. Pincus Sanes, MD Taking Active   solifenacin (VESICARE) 10  MG tablet [id number] Yes Take 10 mg by mouth daily. [provider] Taking Active Self  spironolactone (ALDACTONE) 25 MG tablet [id number] Yes Take 1 tablet (25 mg total) by mouth daily. Christell Constant, MD Taking Active Self            Home Care and  Equipment/Supplies: Were Home Health Services Ordered?: Yes Name of Home Health Agency:: she doesn't know the name of company Any new equipment or medical supplies ordered?: Yes Name of Medical supply agency?: [facility] Were you able to get the equipment/medical supplies?: Yes Do you have any questions related to the use of the equipment/supplies?: No  Functional Questionnaire: Do you need assistance with bathing/showering or dressing?: No Do you need assistance with meal preparation?: No Do you need assistance with eating?: No Do you have difficulty maintaining continence: No Do you need assistance with getting out of bed/getting out of a chair/moving?: No Do you have difficulty managing or taking your medications?: No  Follow up appointments reviewed: PCP Follow-up appointment confirmed?: Yes Date of PCP follow-up appointment?: 08/11/22 [facility] Follow-up appointment confirmed?: NA Do you need transportation to your follow-up appointment?: No Do you understand care options if your condition(s) worsen?: Yes-patient verbalized understanding    SIGNATURE Joselynne Killam,CMA [facility]

## 2022-08-08 NOTE — Telephone Encounter (Signed)
Called pt in regards to FMLA concerns.  Pt reports her sister started filling out paperwork for FMLA.   Advised pt that neurology or PCP office will have to fill out paperwork r/t recent hospital stay.  Pt has f/u OV with PCP on 5/23.  Advised pt to mention FMLA paperwork at this OV.  Pt had no further questions or concerns.

## 2022-08-10 ENCOUNTER — Encounter: Payer: Self-pay | Admitting: Internal Medicine

## 2022-08-10 NOTE — Progress Notes (Unsigned)
Subjective:    Patient ID: Kylie Wright, female    DOB: August 24, 1965, 57 y.o.   MRN: [id number]     HPI Kylie Wright is here for follow up from the hospital  Admitted 5/14 - 5/16 for stroke.  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  She had showed up to work for a meeting with minimal clothes on not knowing what was going on.  In the ED she was A x O x 3.  No neurological deficits.  VSS.   Labs ok.  Ct head w/o acute findings.  MRI of brain 2 mm acute or subacute infarct left corona radiata, left basal ganglia.  MR angio of head and neck, no large vessel occlusion.  2D Echo - stable.  Telemetry w/o afib.  Started on plavix x 3 weeks, ASA.  Continued on crestor.  Neurology saw her.  Chronic medical problems stable.Marland KitchenDischarged to home with home PT/OT  She saw cardio 5/17.  Surgery for her heart has been delayed  Medications and allergies reviewed with patient and updated if appropriate.  Current Outpatient Medications on File Prior to Visit  Medication Sig Dispense Refill   amphetamine-dextroamphetamine (ADDERALL XR) 20 MG 24 hr capsule Take 20 mg by mouth every morning.  0   aspirin EC 81 MG tablet Take 1 tablet (81 mg total) by mouth daily. Swallow whole. 30 tablet 11   cephALEXin (KEFLEX) 500 MG capsule Take 500 mg by mouth 2 (two) times daily.     clopidogrel (PLAVIX) 75 MG tablet Take 1 tablet (75 mg total) by mouth daily for 21 days. 21 tablet 0   disopyramide (NORPACE) 100 MG capsule Take 1 capsule (100 mg total) by mouth 4 (four) times daily. 360 capsule 3   docusate sodium (COLACE) 100 MG capsule Take 100 mg by mouth daily as needed for moderate constipation or mild constipation. Can take up to 3 tabs per day as needed     meclizine (DRAMAMINE) 25 MG tablet Take 25 mg by mouth 3 (three) times daily as needed for dizziness.     Melatonin 10 MG TABS Take 10 mg by mouth at bedtime.     meloxicam (MOBIC) 7.5 MG tablet TAKE 1 TABLET BY MOUTH TWICE  DAILY AS  NEEDED FOR PAIN (Patient taking differently: Take 7.5 mg by mouth daily.) 180 tablet 3   metoprolol succinate (TOPROL XL) 25 MG 24 hr tablet Take 1 tablet (25 mg total) by mouth daily. 90 tablet 3   OLANZapine-Samidorphan 5-10 MG TABS Take 1 tablet by mouth daily.     Olopatadine HCl (PAZEO) 0.7 % SOLN Place 1 drop into both eyes daily as needed (allergies).     polyethylene glycol (MIRALAX / GLYCOLAX) 17 g packet Take 17 g by mouth daily as needed for severe constipation, moderate constipation or mild constipation.     potassium chloride (KLOR-CON M) 10 MEQ tablet Take 10 mEq by mouth daily.     ROCKLATAN 0.02-0.005 % SOLN SMARTSIG:1 Drop(s) In Eye(s) Every Evening     rosuvastatin (CRESTOR) 10 MG tablet TAKE 1 TABLET (10 MG TOTAL) BY MOUTH DAILY. 90 tablet 2   solifenacin (VESICARE) 10 MG tablet Take 10 mg by mouth daily.  3   spironolactone (ALDACTONE) 25 MG tablet Take 1 tablet (25 mg total) by mouth daily. 90 tablet 3   No current facility-administered medications on file prior to visit.     Review of Systems  Objective:  There were no vitals filed for this visit. BP Readings from Last 3 Encounters:  08/05/22 (!) 150/88  08/04/22 (!) 163/99  05/20/22 (!) 144/78   Wt Readings from Last 3 Encounters:  08/05/22 198 lb (89.8 kg)  08/02/22 198 lb (89.8 kg)  05/20/22 203 lb 4.8 oz (92.2 kg)   There is no height or weight on file to calculate BMI.    Physical Exam     Lab Results  Component Value Date   WBC 10.3 08/02/2022   HGB 12.1 08/02/2022   HCT 37.0 08/02/2022   PLT 203 08/02/2022   GLUCOSE 132 (H) 08/02/2022   CHOL 127 08/03/2022   TRIG 194 (H) 08/03/2022   HDL 30 (L) 08/03/2022   LDLCALC 58 08/03/2022   ALT 12 04/15/2022   AST 17 04/15/2022   NA 132 (L) 08/02/2022   K 4.1 08/02/2022   CL 103 08/02/2022   CREATININE 1.36 (H) 08/02/2022   BUN 15 08/02/2022   CO2 21 (L) 08/02/2022   TSH 4.99 04/15/2022   HGBA1C 5.8 04/15/2022     Assessment & Plan:     See Problem List for Assessment and Plan of chronic medical problems.

## 2022-08-10 NOTE — Patient Instructions (Addendum)
      Medications changes include :   none      Return in about 6 months (around 02/11/2023) for follow up -  cancel July appt.

## 2022-08-11 ENCOUNTER — Telehealth: Payer: Self-pay | Admitting: Internal Medicine

## 2022-08-11 ENCOUNTER — Ambulatory Visit (INDEPENDENT_AMBULATORY_CARE_PROVIDER_SITE_OTHER): Payer: Medicaid Other | Admitting: Internal Medicine

## 2022-08-11 VITALS — BP 140/80 | HR 80 | Temp 98.5°F | Ht 62.0 in | Wt 199.0 lb

## 2022-08-11 DIAGNOSIS — I421 Obstructive hypertrophic cardiomyopathy: Secondary | ICD-10-CM

## 2022-08-11 DIAGNOSIS — E7849 Other hyperlipidemia: Secondary | ICD-10-CM

## 2022-08-11 DIAGNOSIS — R7303 Prediabetes: Secondary | ICD-10-CM

## 2022-08-11 DIAGNOSIS — F319 Bipolar disorder, unspecified: Secondary | ICD-10-CM | POA: Diagnosis not present

## 2022-08-11 DIAGNOSIS — Z8673 Personal history of transient ischemic attack (TIA), and cerebral infarction without residual deficits: Secondary | ICD-10-CM

## 2022-08-11 DIAGNOSIS — I10 Essential (primary) hypertension: Secondary | ICD-10-CM

## 2022-08-11 NOTE — Assessment & Plan Note (Signed)
Recent hospitalization for acute stroke Presented with confusion Left corona radiata acute/early subacute infarct, left basal ganglia acute/early subacute infarct Neurology consulted Echo done-stable MR angio head and neck-no large vessel occlusion Telemetry without A-fib-to have monitor implanted Received call to schedule neurology appointment and she will do that today Has started PT, OT-have some weakness that is chronic related to her heart condition

## 2022-08-11 NOTE — Telephone Encounter (Signed)
Okay for orders? 

## 2022-08-11 NOTE — Telephone Encounter (Signed)
HH ORDERS   Caller Name: [facility] Agency Name: Personnel officer Phone #: [phone](secure)  Service Requested: OT, upper body exercise program (examples: OT/PT/Skilled Nursing/Social Work/Speech Therapy/Wound Care)  Frequency of Visits: 1 week 3

## 2022-08-11 NOTE — Telephone Encounter (Signed)
Caller & What Company:  Thu from [facility]   Phone Number:[phone]   Needs Verbal orders for what service & frequency:  Physical therapy 1  times a week for 9 weeks

## 2022-08-11 NOTE — Assessment & Plan Note (Signed)
Chronic Diet controlled Lab Results  Component Value Date   HGBA1C 5.8 04/15/2022

## 2022-08-11 NOTE — Assessment & Plan Note (Signed)
Chronic Management per psychiatry On olanzapine-Samidorphan

## 2022-08-11 NOTE — Assessment & Plan Note (Signed)
Chronic Continue Crestor 10 mg daily  Lab Results  Component Value Date   LDLCALC 58 08/03/2022

## 2022-08-11 NOTE — Assessment & Plan Note (Signed)
Chronic Follows with cardiology-has hypertrophic obstructive cardiomyopathy Blood pressure slightly elevated here today Saw cardiology last week Continue current medications Continue metoprolol XL 25 mg daily, spironolactone 25 mg daily

## 2022-08-11 NOTE — Assessment & Plan Note (Signed)
Chronic Following with cardiology Scheduled to have surgery, but now will need to wait 3 months because of stroke Currently on disopyramide

## 2022-08-12 NOTE — Telephone Encounter (Signed)
Message left for verbals today

## 2022-08-22 DIAGNOSIS — F319 Bipolar disorder, unspecified: Secondary | ICD-10-CM | POA: Diagnosis not present

## 2022-08-22 DIAGNOSIS — F3160 Bipolar disorder, current episode mixed, unspecified: Secondary | ICD-10-CM | POA: Diagnosis not present

## 2022-08-22 DIAGNOSIS — G4700 Insomnia, unspecified: Secondary | ICD-10-CM

## 2022-08-22 DIAGNOSIS — M19072 Primary osteoarthritis, left ankle and foot: Secondary | ICD-10-CM

## 2022-08-22 DIAGNOSIS — F419 Anxiety disorder, unspecified: Secondary | ICD-10-CM | POA: Diagnosis not present

## 2022-08-22 DIAGNOSIS — E785 Hyperlipidemia, unspecified: Secondary | ICD-10-CM

## 2022-08-22 DIAGNOSIS — Z87891 Personal history of nicotine dependence: Secondary | ICD-10-CM

## 2022-08-22 DIAGNOSIS — Z792 Long term (current) use of antibiotics: Secondary | ICD-10-CM

## 2022-08-22 DIAGNOSIS — I421 Obstructive hypertrophic cardiomyopathy: Secondary | ICD-10-CM

## 2022-08-22 DIAGNOSIS — E669 Obesity, unspecified: Secondary | ICD-10-CM

## 2022-08-22 DIAGNOSIS — N1831 Chronic kidney disease, stage 3a: Secondary | ICD-10-CM

## 2022-08-22 DIAGNOSIS — R2689 Other abnormalities of gait and mobility: Secondary | ICD-10-CM | POA: Diagnosis not present

## 2022-08-22 DIAGNOSIS — H269 Unspecified cataract: Secondary | ICD-10-CM

## 2022-08-22 DIAGNOSIS — R7303 Prediabetes: Secondary | ICD-10-CM

## 2022-08-22 DIAGNOSIS — K5900 Constipation, unspecified: Secondary | ICD-10-CM

## 2022-08-22 DIAGNOSIS — F909 Attention-deficit hyperactivity disorder, unspecified type: Secondary | ICD-10-CM

## 2022-08-22 DIAGNOSIS — M6281 Muscle weakness (generalized): Secondary | ICD-10-CM | POA: Diagnosis not present

## 2022-08-22 DIAGNOSIS — Z6835 Body mass index (BMI) 35.0-35.9, adult: Secondary | ICD-10-CM

## 2022-08-22 DIAGNOSIS — Z87440 Personal history of urinary (tract) infections: Secondary | ICD-10-CM

## 2022-08-22 DIAGNOSIS — F900 Attention-deficit hyperactivity disorder, predominantly inattentive type: Secondary | ICD-10-CM | POA: Diagnosis not present

## 2022-08-22 DIAGNOSIS — I69398 Other sequelae of cerebral infarction: Secondary | ICD-10-CM | POA: Diagnosis not present

## 2022-08-22 DIAGNOSIS — M19071 Primary osteoarthritis, right ankle and foot: Secondary | ICD-10-CM

## 2022-08-22 DIAGNOSIS — Z7902 Long term (current) use of antithrombotics/antiplatelets: Secondary | ICD-10-CM

## 2022-08-22 DIAGNOSIS — F4310 Post-traumatic stress disorder, unspecified: Secondary | ICD-10-CM

## 2022-08-22 DIAGNOSIS — Z7982 Long term (current) use of aspirin: Secondary | ICD-10-CM

## 2022-08-22 DIAGNOSIS — F1011 Alcohol abuse, in remission: Secondary | ICD-10-CM

## 2022-08-22 DIAGNOSIS — I129 Hypertensive chronic kidney disease with stage 1 through stage 4 chronic kidney disease, or unspecified chronic kidney disease: Secondary | ICD-10-CM

## 2022-08-22 DIAGNOSIS — H409 Unspecified glaucoma: Secondary | ICD-10-CM

## 2022-09-09 ENCOUNTER — Encounter: Payer: Self-pay | Admitting: Cardiovascular Disease

## 2022-09-09 ENCOUNTER — Ambulatory Visit: Payer: Medicaid Other | Attending: Cardiovascular Disease | Admitting: Cardiovascular Disease

## 2022-09-09 VITALS — BP 146/84 | HR 54 | Ht 62.0 in | Wt 202.0 lb

## 2022-09-09 DIAGNOSIS — I421 Obstructive hypertrophic cardiomyopathy: Secondary | ICD-10-CM | POA: Diagnosis present

## 2022-09-09 DIAGNOSIS — I639 Cerebral infarction, unspecified: Secondary | ICD-10-CM | POA: Diagnosis not present

## 2022-09-09 NOTE — Progress Notes (Signed)
Electrophysiology Office Note:    Date:  09/09/2022   ID:  Kylie Wright, DOB 1965/06/02, MRN [id number]  PCP:  Pincus Sanes, MD   [facility] Providers Cardiologist:  Christell Constant, MD     Referring MD: Pincus Sanes, MD   History of Present Illness:    Kylie Wright is a 57 y.o. female with a medical history significant for hypertrophic cardiomyopathy, stroke referred for loop recorder placement.     I reviewed notes by Drs. Linden Dolin, and [place].  The patient has a history of hypertrophic cardiomyopathy. She was taking mavacamten but it was discontinued due to hair loss. She is being evaluated at [facility] for myectomy. This was delayed because she had an acute stroke.  She does not have a history of syncope, relatives with sudden cardiac death, personal history of VT. Her max septal thickness was 23 mm, and there was no LGE.  She has seen Dr. Graciela Husbands in the past due to bradycardia while taking verapamil.    Today, she reports that she is doing well. She has no new complaints.   EKGs/Labs/Other Studies Reviewed Today:    Echocardiogram:  TTE 08/03/2022 EF 65-70%.  Severe asymmetric septal LV wall thickness, maximal thickness 1.910 m with dynamic LVOT obstruction.  Atria are normal in size.   Monitors:   Stress testing:   Advanced imaging:  Cardiac MRI June 09, 2022 Severe basal hypertrophy with a septal thickness of 23 mm there is no LGE   Cardiac catherization   EKG:   EKG Interpretation  Date/Time:  Friday September 09 2022 12:26:29 EDT Ventricular Rate:  54 PR Interval:  160 QRS Duration: 84 QT Interval:  492 QTC Calculation: 466 R Axis:   3 Text Interpretation: Sinus bradycardia with Premature supraventricular complexes Nonspecific ST abnormality When compared with ECG of 02-Aug-2022 10:28, Premature supraventricular complexes are now Present Nonspecific T wave abnormality now evident in Lateral leads Confirmed by York Pellant [phone]) on 09/09/2022 12:30:16 PM     Physical Exam:    VS:  BP (!) 146/84   Pulse (!) 54   Ht 5\' 2"  (1.575 m)   Wt 202 lb (91.6 kg)   SpO2 98%   BMI 36.95 kg/m     Wt Readings from Last 3 Encounters:  09/09/22 202 lb (91.6 kg)  08/11/22 199 lb (90.3 kg)  08/05/22 198 lb (89.8 kg)     GEN: Well nourished, well developed in no acute distress CARDIAC: RRR, no murmurs, rubs, gallops RESPIRATORY:  Normal work of breathing MUSCULOSKELETAL: no edema    ASSESSMENT & PLAN:    Stroke L MCA ischemic stroke She is at increased risk for AF with HCM Loop recorder has been requested to screen for AF I explained the indication and logistics of the procedure.  I detailed the risks including infection and bleeding.  I explained the monitoring process with the associated monthly charge.  Hypertrophic cardiomyopathy Plans for septal myectomy Does not meet criteria for primary prevention ICD  History of bradycardia While on verapamil  SURGEON:  Maurice Small, MD     PREPROCEDURE DIAGNOSIS:  Cryptogenic stroke    POSTPROCEDURE DIAGNOSIS:  Cryptogenic stroke     PROCEDURES:   1. Implantable loop recorder implantation    INTRODUCTION:   Kylie Wright  is a 57 y.o. patient with a history of cryptogenic stroke. Inpatient telemetry has been reviewed and not shown atrial fibrillation. The patient therefore presents today for implantable  loop implantation. The costs of loop recorder monitoring have been discussed with the patient.    DESCRIPTION OF PROCEDURE:  Informed written consent was obtained.  A timeout was performed. The patient required no sedation for the procedure today.  The patients left chest was prepped and draped in the usual sterile fashion. The skin overlying the left parasternal region was infiltrated with lidocaine for local analgesia.  A 0.5-cm incision was made over the left parasternal region over the 3rd intercostal space.  A Medtronic Linq 2 implantable  loop recorder ([id number]) was then placed.  R waves were very prominent and measured >0.13mV.  Steri- Strips and a sterile dressing were then applied.  There were no early apparent complications.     CONCLUSIONS:   1. Successful implantation of a Medtronic Linq 2 implantable loop recorder for a history of cryptogenic stroke  2. No early apparent complications.   Maurice Small, MD  Cardiac Electrophysiology    Signed, Maurice Small, MD  09/09/2022 12:53 PM    [facility]

## 2022-09-09 NOTE — Patient Instructions (Signed)
Medication Instructions:  Your physician recommends that you continue on your current medications as directed. Please refer to the Current Medication list given to you today.  Labwork: None ordered.  Testing/Procedures: None ordered.  Follow-Up:  Your physician wants you to follow-up in: one year with Dr. Augustus Mealor.  You will receive a reminder letter in the mail two months in advance. If you don't receive a letter, please call our office to schedule the follow-up appointment.  Implantable Loop Recorder Placement, Care After This sheet gives you information about how to care for yourself after your procedure. Your health care provider may also give you more specific instructions. If you have problems or questions, contact your health care provider.  What can I expect after the procedure? After the procedure, it is common to have: Soreness or discomfort near the incision. Some swelling or bruising near the incision.  Follow these instructions at home: Incision care  Monitor your cardiac device site for redness, swelling, and drainage. Call the device clinic at [phone] if you experience these symptoms or fever/chills.  Keep the large square bandage on your site for 24 hours and then you may remove it yourself. Keep the steri-strips underneath in place.   You may shower after 72 hours / 3 days from your procedure with the steri-strips in place. They will usually fall off on their own, or may be removed after 10 days. Pat dry.   Avoid lotions, ointments, or perfumes over your incision until it is well-healed.  Please do not submerge in water until your site is completely healed.   Your device is MRI compatible.   Remote monitoring is used to monitor your cardiac device from home. This monitoring is scheduled every month by our office. It allows us to keep an eye on the function of your device to ensure it is working properly.  If your wound site starts to bleed apply  pressure.    For help with the monitor please call Medtronic Monitor Support Specialist directly at [phone].    If you have any questions/concerns please call the device clinic at [phone].  Activity  Return to your normal activities.  General instructions Follow instructions from your health care provider about how to manage your implantable loop recorder and transmit the information. Learn how to activate a recording if this is necessary for your type of device. You may go through a metal detection gate, and you may let someone hold a metal detector over your chest. Show your ID card if needed. Do not have an MRI unless you check with your health care provider first. Take over-the-counter and prescription medicines only as told by your health care provider. Keep all follow-up visits as told by your health care provider. This is important. Contact a health care provider if: You have redness, swelling, or pain around your incision. You have a fever. You have pain that is not relieved by your pain medicine. You have triggered your device because of fainting (syncope) or because of a heartbeat that feels like it is racing, slow, fluttering, or skipping (palpitations). Get help right away if you have: Chest pain. Difficulty breathing. Summary After the procedure, it is common to have soreness or discomfort near the incision. Change your dressing as told by your health care provider. Follow instructions from your health care provider about how to manage your implantable loop recorder and transmit the information. Keep all follow-up visits as told by your health care provider. This is important. This information   is not intended to replace advice given to you by your health care provider. Make sure you discuss any questions you have with your health care provider. Document Released: 02/16/2015 Document Revised: 04/22/2017 Document Reviewed: 04/22/2017 Elsevier Patient Education   2020 [employer]  

## 2022-09-19 ENCOUNTER — Encounter: Payer: Self-pay | Admitting: Neurology

## 2022-09-19 ENCOUNTER — Ambulatory Visit (INDEPENDENT_AMBULATORY_CARE_PROVIDER_SITE_OTHER): Payer: MEDICAID | Admitting: Neurology

## 2022-09-19 VITALS — BP 146/73 | HR 86 | Ht 62.0 in | Wt 203.5 lb

## 2022-09-19 DIAGNOSIS — I421 Obstructive hypertrophic cardiomyopathy: Secondary | ICD-10-CM

## 2022-09-19 DIAGNOSIS — I639 Cerebral infarction, unspecified: Secondary | ICD-10-CM | POA: Diagnosis not present

## 2022-09-19 NOTE — Progress Notes (Signed)
Chief Complaint  Patient presents with   New Patient (Initial Visit)    RM 13, ALONE, hospital referral for stroke. Pt stated seems to be doing well since stroke       ASSESSMENT AND PLAN  Kylie Wright is a 57 y.o. female   Stroke in May 2024:  Incidental finding on MRI Aug 02, 2022, 2 mm subacute involving left corona radiata and left basal ganglion, less likely cause any symptoms,  She was put on aspirin plus Plavix dual antiplatelet agents and aspirin 81 mg alone,  CT angiogram head and neck showed no large vessel disease  Laboratory evaluation showed LDL 58, A1c 5.8, The major issue is her hypertrophic cardiomyopathy, with thick septal wall 1.9 cm, there is dynamic LVOT obstruction consistent with his obstructive hypertrophic cardiomyopathy, seen [facility] cardiothoracic surgeon, on schedule for septal myectomy, she may proceed with elective procedure, it is okay to hold off aspirin perisurgical period of time due to tiny small vessel incidental findings, on MRI of the brain, less likely cause any symptoms,  DIAGNOSTIC DATA (LABS, IMAGING, TESTING) - I reviewed patient records, labs, notes, testing and imaging myself where available.   MEDICAL HISTORY:  Kylie Wright is a 57 year old female, seen in request by her primary care physician Dr. Lawerance Bach, [facility] for evaluation of stroke, initial evaluation September 19, 2022,  I reviewed and summarized the referring note. PMHX. Bipolar 1 disorder. 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 Mild generalized atrophy, enlarged ventricle   CT angiogram head and neck showed no large vessel disease  Laboratory evaluation LDL 58 A1c 5.3  She has a known history of obstructive hypertrophic cardiomyopathy, under the care of [facility] cardiothoracic surgeon, was scheduled to undergo septal myectomy on Aug 08 2022, that was delayed,  She was put on aspirin plus Plavix for 3 weeks Aspirin 81 mg alone  She had a loop recorder placed, concerning for atrial fibrillation  UDS was positive for amphetamine, reported history of Adderall due to ADHD diagnosis    PHYSICAL EXAM:   Vitals:   09/19/22 1556 09/19/22 1609  BP: (!) 148/89 (!) 146/73  Pulse: 86   Weight: 203 lb 8 oz (92.3 kg)   Height: 5\' 2"  (1.575 m)       Body mass index is 37.22 kg/m.  PHYSICAL EXAMNIATION:  Gen: NAD, conversant, well nourised, well groomed                     Cardiovascular: Regular rate rhythm, no peripheral edema, warm, nontender. Eyes: Conjunctivae clear without exudates or hemorrhage Neck: Supple, no carotid bruits. Pulmonary: Clear to auscultation bilaterally   NEUROLOGICAL EXAM:  MENTAL STATUS: Speech/cognition: Awake, alert, oriented to history taking and casual conversation CRANIAL NERVES: CN II: Visual fields are full to confrontation.  Pupils are round equal and briskly reactive to light. CN III, IV, VI: extraocular movement are normal. No ptosis. CN V: Facial sensation is intact to light touch CN VII: Face is symmetric with normal eye closure  CN VIII: Hearing is normal to causal conversation. CN IX, X: Phonation is normal. CN XI: Head turning and shoulder shrug are intact  MOTOR: There is no pronator drift of out-stretched arms. Muscle bulk and tone are  normal. Muscle strength is normal.  REFLEXES: Reflexes are 2+ and symmetric at the biceps, triceps, knees, and ankles. Plantar responses are flexor.  SENSORY: Intact to light touch, pinprick and vibratory sensation are intact in fingers and toes.  COORDINATION: There is no trunk or limb dysmetria noted.  GAIT/STANCE: Need push-up to get up from seated position, cautious  REVIEW OF SYSTEMS:  Full 14 system review of systems performed and notable only for as above All other review of systems were negative.   ALLERGIES: Allergies  Allergen Reactions   Mavacamten Other (See Comments)    Hair loss    HOME MEDICATIONS: Current Outpatient Medications  Medication Sig Dispense Refill   amphetamine-dextroamphetamine (ADDERALL XR) 30 MG 24 hr capsule Take 30 mg by mouth daily.     aspirin EC 81 MG tablet Take 1 tablet (81 mg total) by mouth daily. Swallow whole. 30 tablet 11   disopyramide (NORPACE) 100 MG capsule Take 1 capsule (100 mg total) by mouth 4 (four) times daily. 360 capsule 3   docusate sodium (COLACE) 100 MG capsule Take 100 mg by mouth daily as needed for moderate constipation or mild constipation. Can take up to 3 tabs per day as needed     meclizine (DRAMAMINE) 25 MG tablet Take 25 mg by mouth 3 (three) times daily as needed for dizziness.     Melatonin 10 MG TABS Take 10 mg by mouth at bedtime.     meloxicam (MOBIC) 7.5 MG tablet TAKE 1 TABLET BY MOUTH TWICE  DAILY AS NEEDED FOR PAIN (Patient taking differently: Take 7.5 mg by mouth daily.) 180 tablet 3   metoprolol succinate (TOPROL XL) 25 MG 24 hr tablet Take 1 tablet (25 mg total) by mouth daily. 90 tablet 3   OLANZapine-Samidorphan 5-10 MG TABS Take 1 tablet by mouth daily.     Olopatadine HCl (PAZEO) 0.7 % SOLN Place 1 drop into both eyes daily as needed (allergies).     polyethylene glycol (MIRALAX / GLYCOLAX) 17 g packet Take 17 g by mouth daily as needed for severe constipation, moderate constipation or mild  constipation.     potassium chloride (KLOR-CON M) 10 MEQ tablet Take 10 mEq by mouth daily.     ROCKLATAN 0.02-0.005 % SOLN SMARTSIG:1 Drop(s) In Eye(s) Every Evening     rosuvastatin (CRESTOR) 10 MG tablet TAKE 1 TABLET (10 MG TOTAL) BY MOUTH DAILY. 90 tablet 2   solifenacin (VESICARE) 10 MG tablet Take 10 mg by mouth daily.  3   spironolactone (ALDACTONE) 25 MG tablet Take 1 tablet (25 mg total) by mouth daily. 90 tablet 3   No current facility-administered medications for this visit.    PAST MEDICAL HISTORY: Past Medical History:  Diagnosis Date   ADHD (attention deficit hyperactivity disorder)    Bipolar 1 disorder (HCC)    History of alcohol abuse    History of drug abuse (HCC)    Interstitial cystitis    Other hypertrophic cardiomyopathy (HCC)     PAST SURGICAL HISTORY: Past Surgical History:  Procedure  Laterality Date   ADENOIDECTOMY  1971   APPENDECTOMY     RIGHT/LEFT HEART CATH AND CORONARY ANGIOGRAPHY N/A 03/30/2022   Procedure: RIGHT/LEFT HEART CATH AND CORONARY ANGIOGRAPHY;  Surgeon: Lennette Bihari, MD;  Location: [facility] INVASIVE CV LAB;  Service: Cardiovascular;  Laterality: N/A;   SALPINGECTOMY  1993   TEE WITHOUT CARDIOVERSION N/A 03/30/2022   Procedure: TRANSESOPHAGEAL ECHOCARDIOGRAM (TEE);  Surgeon: Christell Constant, MD;  Location: [facility] ENDOSCOPY;  Service: Cardiovascular;  Laterality: N/A;   vaginal surgery      FAMILY HISTORY: Family History  Problem Relation Age of Onset   Arthritis Mother    Hyperlipidemia Mother    Heart disease Mother    Stroke Mother    Hypertension Mother    Diabetes Mother    Mental illness Mother    Alcohol abuse Father    Hypertension Other    Diabetes Other    Mental illness Other    Cancer Other    Cancer Sister 72       small intestine    Alcoholism Other     SOCIAL HISTORY: Social History   Socioeconomic History   Marital status: Single    Spouse name: Not on file   Number of children: 1   Years of  education: Not on file   Highest education level: Not on file  Occupational History    Employer: [employer]  Tobacco Use   Smoking status: Former    Packs/day: .5    Types: Cigarettes    Quit date: 03/31/2022    Years since quitting: 0.4   Smokeless tobacco: Never  Substance and Sexual Activity   Alcohol use: No    Alcohol/week: 0.0 standard drinks of alcohol   Drug use: No    Comment: h/o abuse hasn't used since 2002   Sexual activity: Not Currently    Partners: Male    Birth control/protection: None  Other Topics Concern   Not on file  Social History Narrative   Exercise:  Walks a lot, tries to [employer]    Social Determinants of Health   Financial Resource Strain: High Risk (11/15/2021)   Overall Financial Resource Strain (CARDIA)    Difficulty of Paying Living Expenses: Hard  Food Insecurity: No Food Insecurity (08/02/2022)   Hunger Vital Sign    Worried About Running Out of Food in the Last Year: Never true    Ran Out of Food in the Last Year: Never true  Transportation Needs: No Transportation Needs (08/02/2022)   PRAPARE - Administrator, Civil Service (Medical): No    Lack of Transportation (Non-Medical): No  Physical Activity: Not on file  Stress: Not on file  Social Connections: Not on file  Intimate Partner Violence: Not At Risk (08/02/2022)   Humiliation, Afraid, Rape, and Kick questionnaire    Fear of Current or Ex-Partner: No    Emotionally Abused: No    Physically Abused: No    Sexually Abused: No      Levert Feinstein, M.D. Ph.D.  [facility] [address], [address] [place], [place] [address] Ph: [phone] Fax: [phone]  CC:  Dorcas Carrow, MD [address] [place],  [place] [address]  Pincus Sanes, MD

## 2022-09-21 IMAGING — MG MM DIGITAL SCREENING BILAT W/ TOMO AND CAD
6 of 10 series · 6 of 30 positions shown · non-contrast
Comparison: Previous exam(s).

CLINICAL DATA: Screening.

EXAM:
DIGITAL SCREENING BILATERAL MAMMOGRAM WITH TOMOSYNTHESIS AND CAD
TECHNIQUE: Bilateral screening digital craniocaudal and mediolateral oblique
mammograms were obtained. Bilateral screening digital breast
tomosynthesis was performed. The images were evaluated with
computer-aided detection.

[L CC synth-2D]
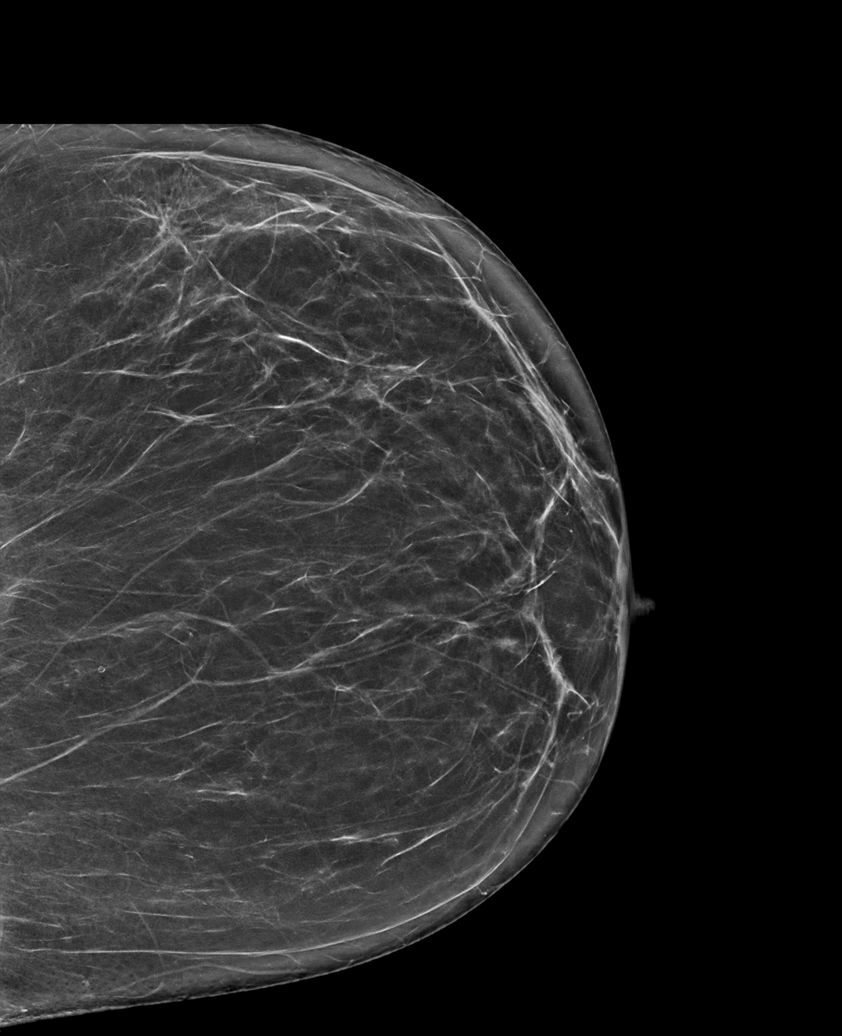

[R MLO synth-2D (1 of 2)]
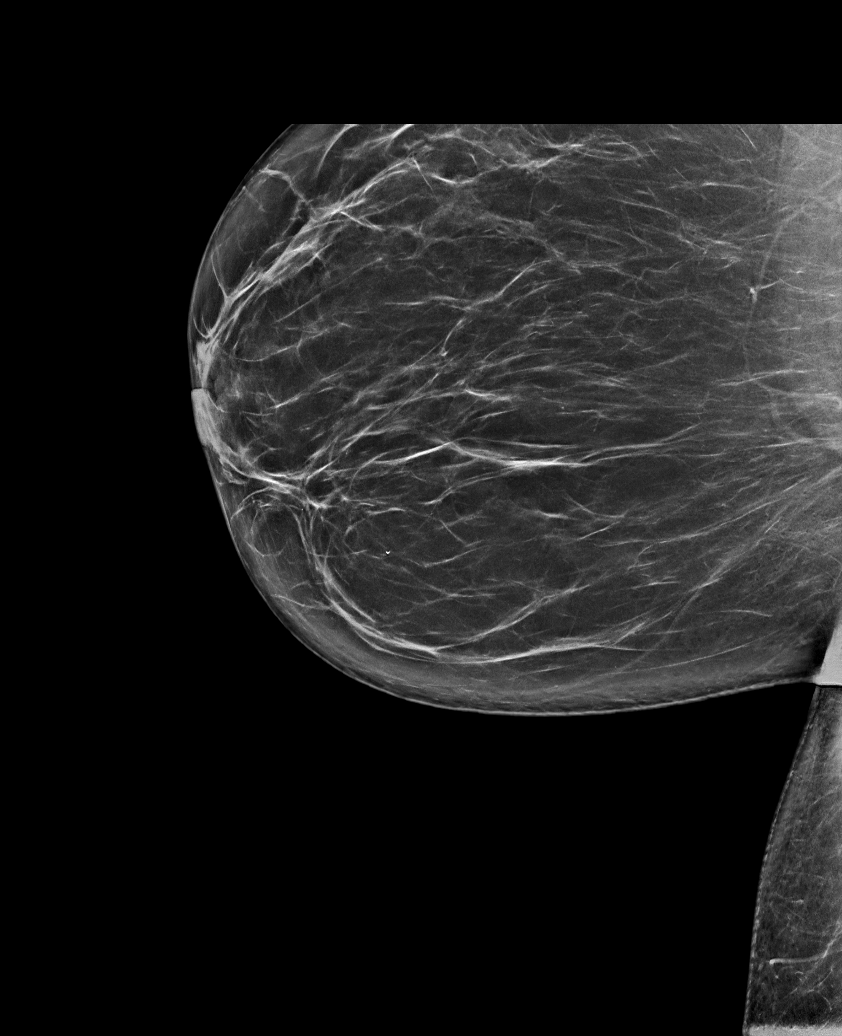

[R CC synth-2D]
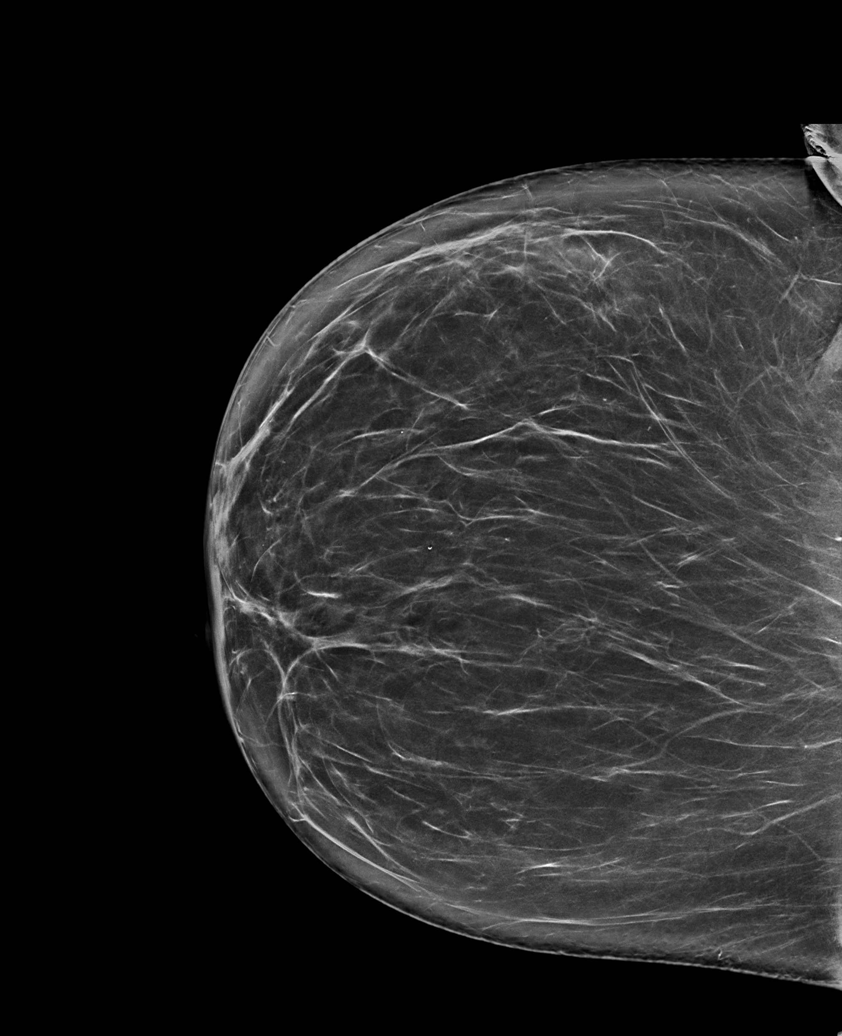

[L MLO synth-2D]
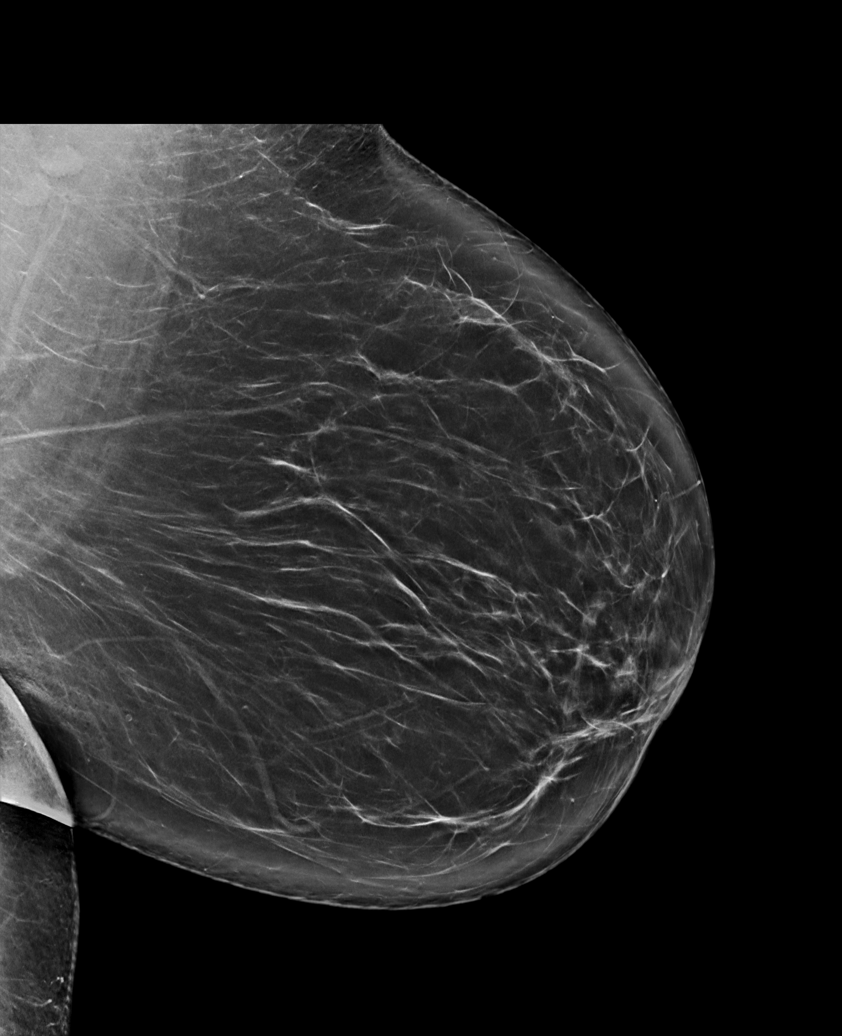

[R MLO synth-2D (2 of 2)]
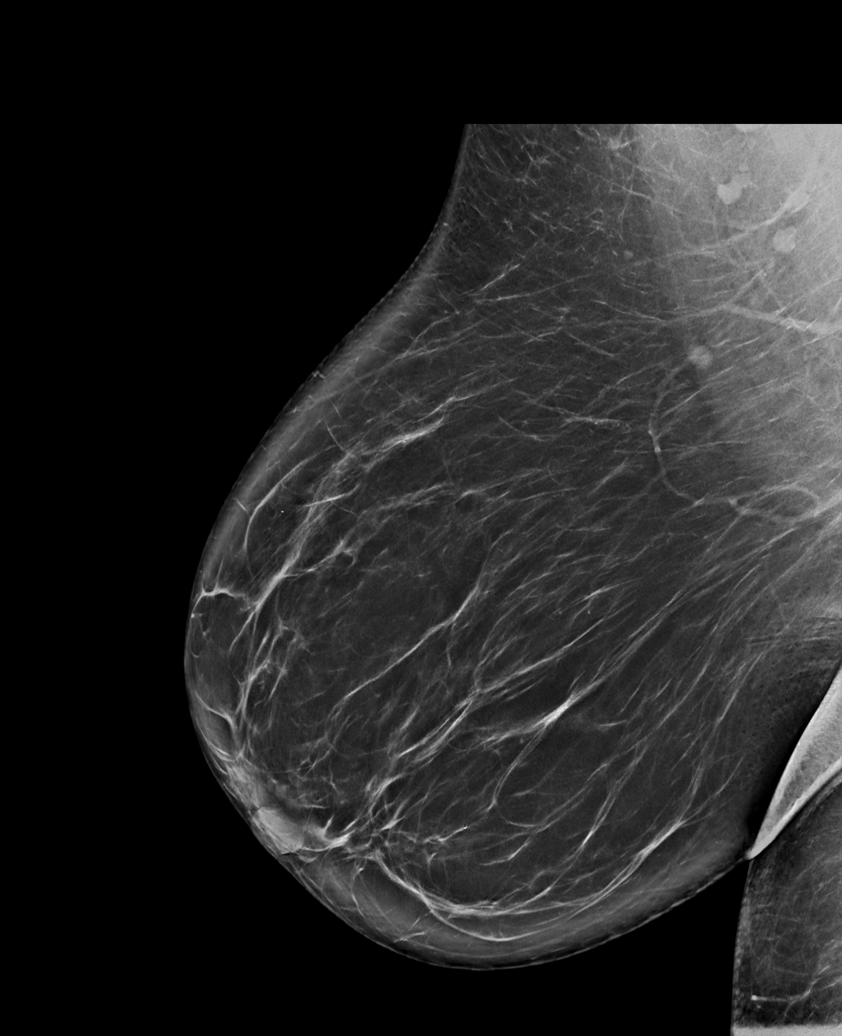

[L MLO tomo · tomo slice 46/91.0]
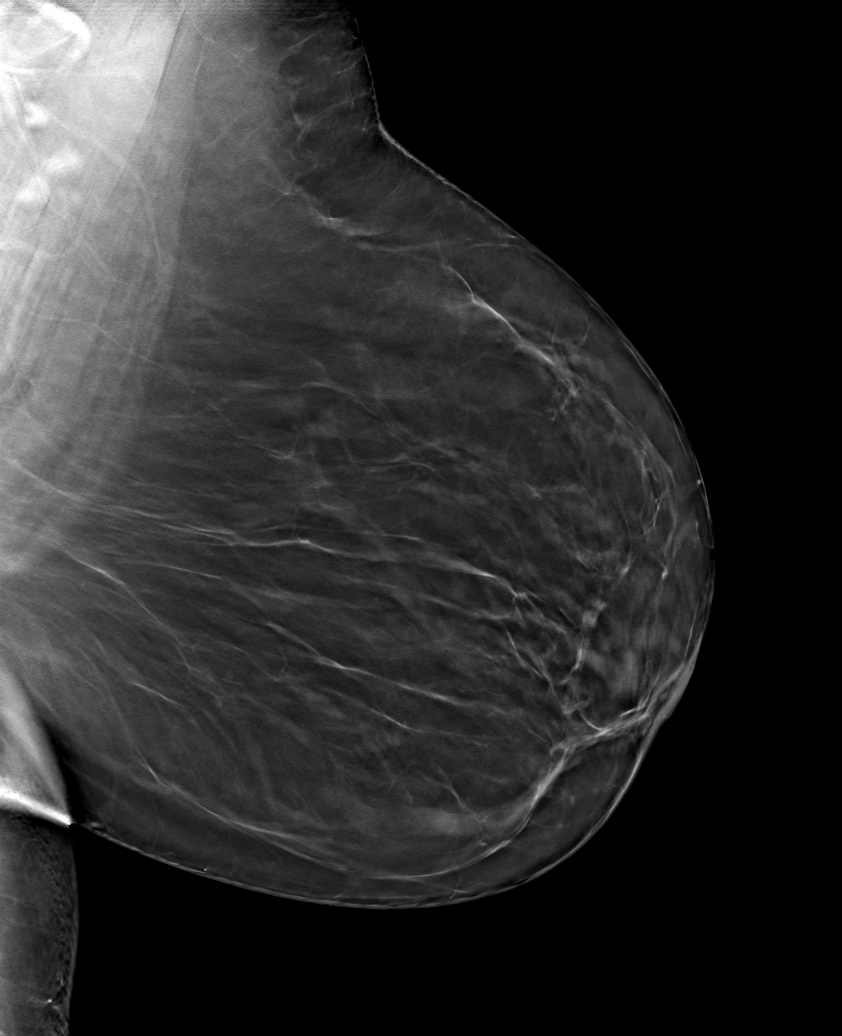

[6 of 30 positions shown; findings below may reference images not displayed]

ACR Breast Density Category b: There are scattered areas of
fibroglandular density.
FINDINGS: There are no findings suspicious for malignancy.
IMPRESSION: No mammographic evidence of malignancy. A result letter of this
screening mammogram will be mailed directly to the patient.

RECOMMENDATION:
Screening mammogram in one year. (Code:51-O-LD2)

BI-RADS CATEGORY  1: Negative.

## 2022-09-30 ENCOUNTER — Other Ambulatory Visit: Payer: Self-pay | Admitting: Internal Medicine

## 2022-10-10 ENCOUNTER — Telehealth: Payer: Self-pay | Admitting: Internal Medicine

## 2022-10-10 NOTE — Telephone Encounter (Signed)
Pt called in asking for Dr. Izora Ribas to write her letter to go back to work for the next weeks until her surgery 10/24/22

## 2022-10-10 NOTE — Telephone Encounter (Signed)
Called pt advised that work note is ready.  Pt reports my chart is not working and will come by and pick up letter.  Advised pt I will leave 2 copies of note at front desk for pick up. No further concerns at this time.

## 2022-10-14 ENCOUNTER — Ambulatory Visit: Payer: Medicaid Other | Admitting: Internal Medicine

## 2022-10-17 ENCOUNTER — Ambulatory Visit (INDEPENDENT_AMBULATORY_CARE_PROVIDER_SITE_OTHER): Payer: MEDICAID

## 2022-10-17 DIAGNOSIS — I639 Cerebral infarction, unspecified: Secondary | ICD-10-CM | POA: Diagnosis not present

## 2022-10-31 ENCOUNTER — Telehealth: Payer: Self-pay

## 2022-10-31 NOTE — Transitions of Care (Post Inpatient/ED Visit) (Signed)
10/31/2022  Name: Kylie Wright MRN: [id number] DOB: 06/16/1965  Today's TOC FU Call Status: Today's TOC FU Call Status:: Successful TOC FU Call Completed TOC FU Call Complete Date: 10/31/22  Transition Care Management Follow-up Telephone Call Date of Discharge: 10/29/22 Discharge Facility: Other ([facility]) Name of Other ([facility]) Discharge Facility: [facility] Type of Discharge: Inpatient Admission Primary Inpatient Discharge Diagnosis:: HOCM (hypertrophic obstructive cardiomyopathy) How have you been since you were released from the hospital?: Better Any questions or concerns?: No  Items Reviewed: Did you receive and understand the discharge instructions provided?: Yes Medications obtained,verified, and reconciled?: Yes (Medications Reviewed) Any new allergies since your discharge?: No Dietary orders reviewed?: Yes Do you have support at home?: Yes  Medications Reviewed Today: Medications Reviewed Today     Reviewed by Merleen Nicely, LPN (Licensed Practical Nurse) on 10/31/22 at 0932  Med List Status: <None>   Medication Order Taking? Sig Documenting Provider Last Dose Status Informant  amphetamine-dextroamphetamine (ADDERALL XR) 30 MG 24 hr capsule [id number] Yes Take 30 mg by mouth daily. [provider] Taking Active   aspirin EC 81 MG tablet [id number] Yes Take 1 tablet (81 mg total) by mouth daily. Swallow whole. Dorcas Carrow, MD Taking Active   disopyramide (NORPACE) 100 MG capsule [id number] Yes TAKE 1 CAPSULE BY MOUTH FOUR TIMES A DAY Chandrasekhar, Mahesh A, MD Taking Active   docusate sodium (COLACE) 100 MG capsule [id number] Yes Take 100 mg by mouth daily as needed for moderate constipation or mild constipation. Can take up to 3 tabs per day as needed [provider] Taking Active Self  meclizine (DRAMAMINE) 25 MG tablet [id number] Yes Take 25 mg by mouth 3 (three) times daily as needed for dizziness. [provider] Taking Active Self   Melatonin 10 MG TABS [id number] Yes Take 10 mg by mouth at bedtime. [provider] Taking Active Self  meloxicam (MOBIC) 7.5 MG tablet [id number] Yes TAKE 1 TABLET BY MOUTH TWICE  DAILY AS NEEDED FOR PAIN  Patient taking differently: Take 7.5 mg by mouth daily.   Pincus Sanes, MD Taking Active Self  metoprolol succinate (TOPROL XL) 25 MG 24 hr tablet [id number] Yes Take 1 tablet (25 mg total) by mouth daily. Christell Constant, MD Taking Active   OLANZapine-Samidorphan 5-10 MG TABS [id number] Yes Take 1 tablet by mouth daily. [provider] Taking Active Self           Med Note Henreitta Leber, Shireen Quan   Wed May 04, 2022  8:39 AM)    Olopatadine HCl (PAZEO) 0.7 % SOLN [id number] Yes Place 1 drop into both eyes daily as needed (allergies). [provider] Taking Active Self  polyethylene glycol (MIRALAX / GLYCOLAX) 17 g packet [id number] Yes Take 17 g by mouth daily as needed for severe constipation, moderate constipation or mild constipation. [provider] Taking Active   potassium chloride (KLOR-CON M) 10 MEQ tablet [id number] Yes Take 10 mEq by mouth daily. [provider] Taking Active   ROCKLATAN 0.02-0.005 % SOLN [id number] Yes SMARTSIG:1 Drop(s) In Eye(s) Every Evening [provider] Taking Active   rosuvastatin (CRESTOR) 10 MG tablet [id number] Yes TAKE 1 TABLET (10 MG TOTAL) BY MOUTH DAILY. Pincus Sanes, MD Taking Active   solifenacin (VESICARE) 10 MG tablet [id number] Yes Take 10 mg by mouth daily. [provider] Taking Active Self  spironolactone (ALDACTONE) 25 MG tablet [id number] Yes Take 1 tablet (25 mg total) by mouth daily. Christell Constant, MD  Taking Active Self            Home Care and Equipment/Supplies: Were Home Health Services Ordered?: Yes Name of Home Health Agency:: unsure of name PT and OT - possible [facility] Has Agency set up a time to come to your home?: No Any new equipment or medical  supplies ordered?: No  Functional Questionnaire: Do you need assistance with bathing/showering or dressing?: Yes (sister assists) Do you need assistance with meal preparation?: Yes Do you need assistance with eating?: No Do you have difficulty maintaining continence: No Do you need assistance with getting out of bed/getting out of a chair/moving?: Yes Do you have difficulty managing or taking your medications?: Yes (sister assists)  Follow up appointments reviewed: PCP Follow-up appointment confirmed?: No MD Provider Line Number:[phone] Given: Yes Specialist Hospital Follow-up appointment confirmed?: Yes Date of Specialist follow-up appointment?: 11/08/22 Follow-Up Specialty Provider:: surgeon Do you need transportation to your follow-up appointment?: No Do you understand care options if your condition(s) worsen?: Yes-patient verbalized understanding    SIGNATURE  Woodfin Ganja LPN [facility] Nurse Health Advisor Direct Dial [phone]

## 2022-11-03 ENCOUNTER — Telehealth: Payer: Self-pay | Admitting: Internal Medicine

## 2022-11-03 NOTE — Telephone Encounter (Signed)
Called Patient Reviewed myectomy care and mild MR Reviewed LAAA clip Has new Left bundle branch block  Has ILR to evaluate for heart block  Will make no changes at this time.  Patient had no further questions.  Riley Lam, MD Cardiologist [facility]  [address] [place], [address] [place], [place] [address] [phone]  1:34 PM

## 2022-11-03 NOTE — Progress Notes (Signed)
Carelink Summary Report / Loop Recorder 

## 2022-11-17 ENCOUNTER — Telehealth: Payer: Self-pay

## 2022-11-17 NOTE — Transitions of Care (Post Inpatient/ED Visit) (Signed)
   11/17/2022  Name: Kylie Wright MRN: [id number] DOB: 06-04-1965  Today's TOC FU Call Status: Today's TOC FU Call Status:: Unsuccessful Call (1st Attempt) Unsuccessful Call (1st Attempt) Date: 11/17/22  Attempted to reach the patient regarding the most recent Inpatient/ED visit.  Follow Up Plan: Additional outreach attempts will be made to reach the patient to complete the Transitions of Care (Post Inpatient/ED visit) call.   Krissy Orebaugh J. Cristela Felt, RN, BSN, MSN Care Management Coordinator/[place] Phone Number: [phone]

## 2022-11-18 ENCOUNTER — Telehealth: Payer: Self-pay

## 2022-11-18 NOTE — Transitions of Care (Post Inpatient/ED Visit) (Signed)
   11/18/2022  Name: Kylie Wright MRN: [id number] DOB: 1965-04-27  Today's TOC FU Call Status: Today's TOC FU Call Status:: Unsuccessful Call (2nd Attempt) Unsuccessful Call (1st Attempt) Date: 11/17/22 Unsuccessful Call (2nd Attempt) Date: 11/18/22  Attempted to reach the patient regarding the most recent Inpatient/ED visit.  Follow Up Plan: Additional outreach attempts will be made to reach the patient to complete the Transitions of Care (Post Inpatient/ED visit) call.   Keyondre Hepburn J. Cristela Felt, RN, BSN, MSN Care Management Coordinator/[place] Phone Number:  [phone]

## 2022-11-20 LAB — CUP PACEART REMOTE DEVICE CHECK
Date Time Interrogation Session: 20240830102420
Implantable Pulse Generator Implant Date: 20240621

## 2022-11-22 ENCOUNTER — Ambulatory Visit (INDEPENDENT_AMBULATORY_CARE_PROVIDER_SITE_OTHER): Payer: Medicaid Other

## 2022-11-22 DIAGNOSIS — I639 Cerebral infarction, unspecified: Secondary | ICD-10-CM

## 2022-11-23 ENCOUNTER — Telehealth: Payer: Self-pay

## 2022-11-23 NOTE — Transitions of Care (Post Inpatient/ED Visit) (Signed)
11/23/2022  Name: Kylie Wright MRN: [id number] DOB: Aug 16, 1965  Today's TOC FU Call Status: Today's TOC FU Call Status:: Successful TOC FU Call Completed Unsuccessful Call (1st Attempt) Date: 11/17/22 Unsuccessful Call (2nd Attempt) Date: 12/19/22 [facility] FU Call Complete Date: 11/23/22 Patient's Name and Date of Birth confirmed.  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  Plan is for her to go back to her home.  Medications Reviewed Today: Medications Reviewed Today     Reviewed by Amada Kingfisher, RN (Registered Nurse) on 11/23/22 at 1609  Med List Status: <None>   Medication Order Taking? Sig Documenting Provider Last Dose Status Informant  amphetamine-dextroamphetamine (ADDERALL XR) 30 MG 24 hr capsule [id number] Yes Take 30 mg by mouth daily. [provider] Taking Active   aspirin EC 81 MG tablet [id number] Yes Take 1 tablet (81 mg total) by mouth daily. Swallow whole. Dorcas Carrow, MD Taking Active   disopyramide (NORPACE) 100 MG capsule [id number] Yes TAKE 1 CAPSULE BY MOUTH FOUR TIMES A DAY Chandrasekhar, Mahesh A, MD Taking Active   docusate sodium (COLACE) 100 MG capsule [id number] Yes Take 100 mg by  mouth daily as needed for moderate constipation or mild constipation. Can take up to 3 tabs per day as needed [provider] Taking Active Self  meclizine (DRAMAMINE) 25 MG tablet [id number] Yes Take 25 mg by mouth 3 (three) times daily as needed for dizziness. [provider] Taking Active Self  Melatonin 10 MG TABS [id number] Yes Take 10 mg by mouth at bedtime. [provider] Taking Active Self  meloxicam (MOBIC) 7.5 MG tablet [id number] No TAKE 1 TABLET BY MOUTH TWICE  DAILY AS NEEDED FOR PAIN  Patient not taking: Reported on 11/23/2022   Pincus Sanes, MD Not Taking Active Self  metoprolol succinate (TOPROL XL) 25 MG 24 hr tablet [id number] Yes Take 1 tablet (25 mg total) by mouth daily. Christell Constant, MD Taking Active   OLANZapine-Samidorphan 5-10 MG TABS [id number] Yes Take 1 tablet by mouth daily. [provider] Taking Active Self           Med Note Henreitta Leber, Shireen Quan   Wed May 04, 2022  8:39 AM)    Olopatadine HCl (PAZEO) 0.7 % SOLN [id number] Yes Place 1 drop into both eyes daily as needed (allergies). [provider] Taking Active Self  polyethylene glycol (MIRALAX / GLYCOLAX) 17 g packet [id number] Yes Take 17 g by mouth daily as needed for severe constipation, moderate constipation or mild constipation. [provider] Taking Active   potassium chloride (KLOR-CON M) 10 MEQ tablet [id number] Yes Take 10 mEq by mouth daily. [provider] Taking Active   ROCKLATAN 0.02-0.005 % SOLN [id number] Yes SMARTSIG:1 Drop(s) In Eye(s) Every Evening [provider] Taking Active   rosuvastatin (CRESTOR) 10 MG tablet [id number] Yes TAKE 1 TABLET (10 MG TOTAL) BY MOUTH DAILY. Burns,  Bobette Mo, MD Taking Active   solifenacin (VESICARE) 10 MG tablet [id number] Yes Take 10 mg by mouth daily. [provider] Taking Active Self  spironolactone (ALDACTONE) 25 MG tablet [id number] Yes Take 1 tablet (25 mg total) by mouth daily.  Christell Constant, MD Taking Active Self            Home Care and Equipment/Supplies: Were Home Health Services Ordered?: Yes (Home Health for nursing - wound vac management) Name of Home Health Agency:: [facility] Has Agency set up a time to come to your home?: Yes First Home Health Visit Date: 11/18/22 (3 x per week, M-W-F) Any new equipment or medical supplies ordered?: Yes (Wound Vac Supplies) Were you able to get the equipment/medical supplies?: Yes  Functional Questionnaire: Do you need assistance with bathing/showering or dressing?: No Do you need assistance with meal preparation?: Yes (Sister is preparing meals) Do you need assistance with eating?: No Do you have difficulty maintaining continence: No Do you need assistance with getting out of bed/getting out of a chair/moving?: Yes (related to wound vac, no falls, sister assists as needed) Do you have difficulty managing or taking your medications?: No  Follow up appointments reviewed: Specialist Hospital Follow-up appointment confirmed?: Yes Date of Specialist follow-up appointment?: 11/30/22 Follow-Up Specialty Provider:: [facility] Do you need transportation to your follow-up appointment?: No (Sister will be providing transportation to appointment) Do you understand care options if your condition(s) worsen?: Yes-patient verbalized understanding  SDOH Interventions Today    Flowsheet Row Most Recent Value  SDOH Interventions   Food Insecurity Interventions Intervention Not Indicated  Housing Interventions Intervention Not Indicated  [Currently residing with sister during recovery period.  Plan is for her to return to her home.]  Utilities Interventions Intervention Not Indicated       Goals      Transition of Care     Current Barriers:  Knowledge Deficits related to plan of care for management of sternal wound infection   RNCM Clinical Goal(s):  Patient demonstrates understanding  of disease process, treatment plan, medications, and understanding of follow-up care.   Interventions: Evaluation of current treatment plan related to  self management and patient's adherence to plan as established by provider   Impaired Skin Integrity r/t sternal wound infection (Status:  New goal.)  Short Term Goal Discussed plans with patient for ongoing care management follow up and provided patient with direct contact information for care management team Evaluation of current treatment plan related to sternal wound infection and patient's adherence to plan as established by provider Assessed social determinant of health barriers -  temporarily resides with sister during recovery.  Plan to return to her home after recovery period,  re-assess for future needs .  Monitor ability to perform self care and identify potential needs.    Patient Goals/Self-Care Activities: Take medications as prescribed   Attend all scheduled provider appointments Call pharmacy for medication refills 3-7 days in advance of running out of medications Perform all self care activities independently  Perform IADL's (shopping, preparing meals, housekeeping, managing finances) independently Call provider office for new concerns or questions            Atanacio Melnyk J. Cristela Felt, RN, BSN, MSN Care Management Coordinator/[place] Phone Number:  [phone]

## 2022-11-23 NOTE — Patient Instructions (Signed)
Visit Information  Thank you for taking time to visit with me today. Please don't hesitate to contact me if I can be of assistance to you before our next scheduled telephone appointment.  Our next appointment is by telephone on Thursday, December 01, 2022 at 2 pm.  Following is a copy of your care plan:   Goals Addressed             This Visit's Progress    Transition of Care       Current Barriers:  Knowledge Deficits related to plan of care for management of sternal wound infection   RNCM Clinical Goal(s):  Patient demonstrates understanding of disease process, treatment plan, medications, and understanding of follow-up care.   Interventions: Evaluation of current treatment plan related to  self management and patient's adherence to plan as established by provider   Impaired Skin Integrity r/t sternal wound infection (Status:  New goal.)  Short Term Goal Discussed plans with patient for ongoing care management follow up and provided patient with direct contact information for care management team Evaluation of current treatment plan related to sternal wound infection and patient's adherence to plan as established by provider Assessed social determinant of health barriers -  temporarily resides with sister during recovery.  Plan to return to her home after recovery period,  re-assess for future needs .  Monitor ability to perform self care and identify potential needs.    Patient Goals/Self-Care Activities: Take medications as prescribed   Attend all scheduled provider appointments Call pharmacy for medication refills 3-7 days in advance of running out of medications Perform all self care activities independently  Perform IADL's (shopping, preparing meals, housekeeping, managing finances) independently Call provider office for new concerns or questions             Telephone follow up appointment with care management team member scheduled for: Thursday, December 01, 2022 at 2 pm.   Please call the care guide team at [phone] if you need to cancel or reschedule your appointment.   Please call the [place] National Suicide Prevention Lifeline: 786-733-2740 or TTY: 984-441-9057 TTY (312) 262-5484) to talk to a trained counselor if you are experiencing a Mental Health or Behavioral Health Crisis or need someone to talk to.  Makailah Slavick J. Cristela Felt, RN, BSN, MSN Care Management Coordinator/[place] Phone Number:  [phone]

## 2022-12-01 ENCOUNTER — Telehealth: Payer: Self-pay

## 2022-12-01 ENCOUNTER — Other Ambulatory Visit: Payer: Medicaid Other

## 2022-12-01 DIAGNOSIS — I421 Obstructive hypertrophic cardiomyopathy: Secondary | ICD-10-CM

## 2022-12-01 NOTE — Progress Notes (Signed)
Carelink Summary Report / Loop Recorder 

## 2022-12-01 NOTE — Patient Instructions (Signed)
Visit Information  Dear Kylie Wright,  Thank you for taking time to visit with me today. It was a pleasure speaking to both you and your sister Kylie Wright regarding your impressive progress in becoming functionally independent enough to return back to your own home. It appears you will need to continue to require a Wound Vac dressing to your sternum surgical incision site for at least a few more weeks in order for your sternum wound to heal properly from inside out. I have made a referral on your behalf to our Social Worker, Gus Puma to discuss additional financial resources you may be entitled to while you are convalescing and to discuss possible assistance in replacing your inoperable refrigerator/freezer in your home. Please don't hesitate to contact me if I can be of assistance to you before our next scheduled telephone appointment.  Our next appointment is by telephone on Sept 19 at 2pm  Warm regards,  Elnita Maxwell  Following is a copy of your care plan:   Goals Addressed             This Visit's Progress    Transition of Care       Current Barriers:  Knowledge Deficits related to plan of care for management of sternum wound infection requiring a Wound Vac for proper healing.   RNCM Clinical Goal(s):  Patient demonstrates understanding of disease process, treatment plan, medications, and understanding of follow-up care.   Interventions: Evaluation of current treatment plan related to  self management and patient's adherence to plan as established by provider Continues to require Home Health Services to change wound vac dressing Mon/Wed/Fri. Services provided by [facility]. Patient taught how to apply a wet-to-dry dressing to sternum wound temporarily if wound vac becomes dysfunctional and HHS unable to change out wound vac within prescribed amount of 'downtime".  Impaired Skin Integrity r/t sternum wound infection (Status: On track)  Short Term Goal Discussed plans  with patient for ongoing care management follow up and provided patient with direct contact information for care management team Evaluation of current treatment plan related to sternum wound infection and patient's adherence to plan as established by provider Assessed social determinant of health barriers -  temporarily resides with sister Kylie Wright during recovery.  Patient plans to return to her home after recovery period,  re-assess for future needs. 9/12 telephone visit update - Patient and her sister are currently planning patient's tentative return home sometime next week. Monitor ability to perform self care and identify potential needs.    Patient Goals/Self-Care Activities: Take medications as prescribed   Attend all scheduled provider appointments Call pharmacy for medication refills 3-7 days in advance of running out of medications Perform all self care activities independently  Perform IADL's (shopping, preparing meals, housekeeping, managing finances) independently Call provider office for new concerns or questions           Patient verbalizes understanding of instructions and care plan provided today and agrees to view in MyChart. Active MyChart status and patient understanding of how to access instructions and care plan via MyChart confirmed with patient.     The patient has been provided with contact information for the care management team and has been advised to call with any health related questions or concerns.   Please call the care guide team at [phone] if you need to cancel or reschedule your appointment.   Please call 1-800-273-TALK (toll free, 24 hour hotline) if you are experiencing a Mental Health or Behavioral  Health Crisis or need someone to talk to.  Alyse Low, RN, BA, [facility], CRRN [facility] Coordinator, Transition of Care Ph # [phone]

## 2022-12-01 NOTE — Patient Outreach (Signed)
Care Management  Transitions of Care Program Managed Medicaid Transitions of Care week 3   12/01/2022 Name: Kylie Wright MRN: [id number] DOB: July 04, 1965  Subjective: EMRI CADD is a 57 y.o. year old female who is a primary care patient of Burns, Kylie Mo, MD. The Care Management team Engaged with patient Engaged with patient by telephone to assess and address transitions of care needs.   Consent to Services:  Patient was given information about Managed Medicaid Care Management services, agreed to services, and gave verbal consent to participate.   Assessment:           SDOH Interventions    Flowsheet Row Telephone from 11/23/2022 in [place] POPULATION [facility]ALTH DEPARTMENT ED to Hosp-Admission (Discharged) from 08/02/2022 in [facility] MEDICAL TELEMETRY Telephone from 11/15/2021 in [facility]  SDOH Interventions     Food Insecurity Interventions Intervention Not Indicated Intervention Not Indicated Intervention Not Indicated  Housing Interventions Intervention Not Indicated  [Currently residing with sister during recovery period.  Plan is for her to return to her home.] Intervention Not Indicated Intervention Not Indicated  Transportation Interventions -- Intervention Not Indicated --  Utilities Interventions Intervention Not Indicated Intervention Not Indicated --  Financial Strain Interventions -- -- Other (Comment)        Goals Addressed             This Visit's Progress    Transition of Care       Current Barriers:  Knowledge Deficits related to plan of care for management of sternum wound infection requiring a Wound Vac for proper healing.   RNCM Clinical Goal(s):  Patient demonstrates understanding of disease process, treatment plan, medications, and understanding of follow-up care.   Interventions: Evaluation of current treatment plan related to  self management and patient's adherence to plan  as established by provider Continues to require Home Health Services to change wound vac dressing Mon/Wed/Fri. Services provided by [facility]. Patient taught how to apply a wet-to-dry dressing to sternum wound temporarily if wound vac becomes dysfunctional and HHS unable to change out wound vac within prescribed amount of 'downtime".  Impaired Skin Integrity r/t sternum wound infection (Status: On track)  Short Term Goal Discussed plans with patient for ongoing care management follow up and provided patient with direct contact information for care management team Evaluation of current treatment plan related to sternum wound infection and patient's adherence to plan as established by provider Assessed social determinant of health barriers -  temporarily resides with sister [name] during recovery.  Patient plans to return to her home after recovery period,  re-assess for future needs. 9/12 telephone visit update - Patient and her sister are currently planning patient's tentative return home sometime next week. Monitor ability to perform self care and identify potential needs.    Patient Goals/Self-Care Activities: Take medications as prescribed   Attend all scheduled provider appointments Call pharmacy for medication refills 3-7 days in advance of running out of medications Perform all self care activities independently  Perform IADL's (shopping, preparing meals, housekeeping, managing finances) independently Call provider office for new concerns or questions           Plan: The patient has been provided with contact information for the care management team and has been advised to call with any health related questions or concerns.   Alyse Low, RN, BA, [facility], CRRN [facility] Population Health Care Management Coordinator, Transition of Care Ph #  [phone]   

## 2022-12-08 ENCOUNTER — Telehealth: Payer: Self-pay

## 2022-12-08 ENCOUNTER — Other Ambulatory Visit: Payer: Medicaid Other

## 2022-12-08 NOTE — Patient Outreach (Signed)
Care Management  Transitions of Care Program Managed Medicaid Transitions of Care week 4   12/08/2022 Name: Kylie Wright MRN: [id number] DOB: 08/22/1965  Subjective: Kylie Wright is a 57 y.o. year old female who is a primary care patient of Burns, Kylie Mo, MD. The Care Management team Engaged with patient Engaged with patient by telephone to assess and address transitions of care needs.   Consent to Services:  Patient was given information about Managed Medicaid Care Management services, agreed to services, and gave verbal consent to participate.   Assessment:           SDOH Interventions    Flowsheet Row Telephone from 11/23/2022 in [place] POPULATION HEALTH DEPARTMENT ED to Hosp-Admission (Discharged) from 08/02/2022 in [facility] 1C MEDICAL TELEMETRY Telephone from 11/15/2021 in [facility] Special[facility]  SDOH Interventions     Food Insecurity Interventions Intervention Not Indicated Intervention Not Indicated Intervention Not Indicated  Housing Interventions Intervention Not Indicated  [Currently residing with sister during recovery period.  Plan is for her to return to her home.] Intervention Not Indicated Intervention Not Indicated  Transportation Interventions -- Intervention Not Indicated --  Utilities Interventions Intervention Not Indicated Intervention Not Indicated --  Financial Strain Interventions -- -- Other (Comment)        Goals Addressed             This Visit's Progress    Transition of Care       Current Barriers:  Knowledge Deficits related to plan of care for management of sternum wound infection requiring a Wound Vac w/ continuous suction @ for several weeks (at least through October 2024) for proper healing.   RNCM Clinical Goal(s): Short term goals are ON TRACK Patient demonstrates understanding of disease process, treatment plan, medications, and understanding of follow-up care.    Interventions: Evaluation of current treatment plan related to  self management and patient's adherence to plan as established by provider Continues to require Home Health Services to change wound vac dressing Mon/Wed/Fri. Services provided by [facility]. Patient taught how to apply a wet-to-dry dressing to sternum wound temporarily if wound vac becomes dysfunctional and HHS unable to change out wound vac within prescribed amount of 'downtime".  Impaired Skin Integrity r/t sternum wound infection (Status: On track)  Short Term Goal Discussed plans with patient for ongoing care management follow up and provided patient with direct contact information for care management team Evaluation of current treatment plan related to sternum wound infection and patient's adherence to plan as established by provider Assessed social determinant of health barriers -  temporarily resides with sister Kylie Wright during recovery.  Patient plans to return to her home after recovery period,  re-assess for future needs. 9/12 telephone visit update - Patient and her sister are currently planning patient's tentative return home sometime next week. 9/19 telephone visit update - Patient is planning to continue living with her sister at her sister's home for the near, foreseeable future while she receives HHS including SN for wound vac dressing changes MWF, and weekly PT for strength training and re-conditioning to prior level of functioning. Monitor ability to perform self care and identify potential needs.    Patient Goals/Self-Care Activities: Take medications as prescribed   Attend all scheduled provider appointments Call pharmacy for medication refills 3-7 days in advance of running out of medications Perform all self care activities independently  Perform IADL's (shopping, preparing meals, housekeeping,  managing finances) independently Call provider office for new concerns or questions            Plan: The patient has been provided with contact information for the care management team and has been advised to call with any health related questions or concerns.   Alyse Low, RN, BA, [facility], CRRN [facility] Coordinator, Transition of Care Ph # [phone]

## 2022-12-08 NOTE — Patient Instructions (Signed)
Visit Information  Dear Kylie Wright,  Thank you for taking time to visit with me today. Please don't hesitate to contact me if I can be of assistance to you before our next scheduled telephone appointment.  Our next and final nursing appointment is by telephone on Wednesday, Sept 25 at 2pm to discuss resolution of ongoing pain management issues, especially during Wound Vac dressing changes 3X/week where the pain is 10/10 with current pain management. Patient committed to first calling her Surgeon's office to ask for a refill on present narcotic med, and second, to her PCP if unsuccessful obtaining needed medication from MD managing her post op course.  Patient Kylie Wright was offered continuation of Nursing Case Management involvement with a Longitudinal RNCM but patient felt she had enough ongoing support and graciously declined the offer.  Warm Regards,  Elnita Maxwell  Following is a copy of your care plan:   Goals Addressed             This Visit's Progress    Transition of Care       Current Barriers:  Knowledge Deficits related to plan of care for management of sternum wound infection requiring a Wound Vac w/ continuous suction @ for several weeks (at least through October 2024) for proper healing.   RNCM Clinical Goal(s): Short term goals are ON TRACK Patient demonstrates understanding of disease process, treatment plan, medications, and understanding of follow-up care.   Interventions: Evaluation of current treatment plan related to  self management and patient's adherence to plan as established by provider Continues to require Home Health Services to change wound vac dressing Mon/Wed/Fri. Services provided by [facility]. Patient taught how to apply a wet-to-dry dressing to sternum wound temporarily if wound vac becomes dysfunctional and HHS unable to change out wound vac within prescribed amount of 'downtime".  Impaired Skin Integrity r/t sternum wound infection  (Status: On track)  Short Term Goal Discussed plans with patient for ongoing care management follow up and provided patient with direct contact information for care management team Evaluation of current treatment plan related to sternum wound infection and patient's adherence to plan as established by provider Assessed social determinant of health barriers -  temporarily resides with sister Kylie Wright during recovery.  Patient plans to return to her home after recovery period,  re-assess for future needs. 9/12 telephone visit update - Patient and her sister are currently planning patient's tentative return home sometime next week. 9/19 telephone visit update - Patient is planning to continue living with her sister at her sister's home for the near, foreseeable future while she receives HHS including SN for wound vac dressing changes MWF, and weekly PT for strength training and re-conditioning to prior level of functioning. Monitor ability to perform self care and identify potential needs.    Patient Goals/Self-Care Activities: Take medications as prescribed   Attend all scheduled provider appointments Call pharmacy for medication refills 3-7 days in advance of running out of medications Perform all self care activities independently  Perform IADL's (shopping, preparing meals, housekeeping, managing finances) independently Call provider office for new concerns or questions           Patient verbalizes understanding of instructions and care plan provided today and agrees to view in MyChart. Active MyChart status and patient understanding of how to access instructions and care plan via MyChart confirmed with patient.     The patient has been provided with contact information for the care management team and has  been advised to call with any health related questions or concerns.  An initial telephone outreach has been scheduled for: Social Worker, Gus Puma BSW involvement on Monday, 9/23 @ 3pm  to discuss possible financial resources available to help replace patient's inoperable refrigerator and help with food costs.  Please call the care guide team at [phone] if you need to cancel or reschedule your appointment.   Please call 1-800-273-TALK (toll free, 24 hour hotline) if you are experiencing a Mental Health or Behavioral Health Crisis or need someone to talk to.  Alyse Low, RN, BA, [facility], CRRN [facility] Coordinator, Transition of Care Ph # [phone]

## 2022-12-12 ENCOUNTER — Other Ambulatory Visit: Payer: Medicaid Other

## 2022-12-12 NOTE — Patient Outreach (Signed)
Medicaid Managed Care Social Work Note  12/12/2022 Name:  Kylie Wright MRN:  [id number] DOB:  10/12/1965  Kylie Wright is an 57 y.o. year old female who is a primary patient of Burns, Bobette Mo, MD.  The [facility] Managed Care Coordination team was consulted for assistance with:  Food Insecurity Refridgrator.  Kylie Wright was given information about Medicaid Managed Care Coordination team services today. Kylie Wright Patient agreed to services and verbal consent obtained.  Engaged with patient  for by telephone forinitial visit in response to referral for case management and/or care coordination services.   Assessments/Interventions:  Review of past medical history, allergies, medications, health status, including review of consultants reports, laboratory and other test data, was performed as part of comprehensive evaluation and provision of chronic care management services.  SDOH: (Social Determinant of Health) assessments and interventions performed: SDOH Interventions    Flowsheet Row Telephone from 11/23/2022 in [place] POPULATION HEALTH DEPARTMENT ED to Hosp-Admission (Discharged) from 08/02/2022 in [facility] 1C MEDICAL TELEMETRY Telephone from 11/15/2021 in [facility] Specialty Clinics  SDOH Interventions     Food Insecurity Interventions Intervention Not Indicated Intervention Not Indicated Intervention Not Indicated  Housing Interventions Intervention Not Indicated  [Currently residing with sister during recovery period.  Plan is for her to return to her home.] Intervention Not Indicated Intervention Not Indicated  Transportation Interventions -- Intervention Not Indicated --  Utilities Interventions Intervention Not Indicated Intervention Not Indicated --  Financial Strain Interventions -- -- Other (Comment)      BSW completed a telephone outreach with patient, she states she does not qualify for foodstamps due to her disability.  Patient states she is currently living with her sister in [place], she is receiving home health for now. Patient states she does not want case management services and does not have any needs right now other than a refrigerator. BSW researched for resources that assists with getting a refrigerator and was unable to locate any. No additional resources are needed at this time. Advanced Directives Status:  Not addressed in this encounter.  Care Plan                 Allergies  Allergen Reactions   Mavacamten Other (See Comments)    Hair loss   Vancomycin Rash    Medications Reviewed Today   Medications were not reviewed in this encounter     Patient Active Problem List   Diagnosis Date Noted   Stroke (cerebrum) (HCC) 08/03/2022   History of stroke 08/02/2022   Constipation 04/15/2022   Glaucoma 03/26/2021   Cataract 03/26/2021   Bradycardia 03/16/2021   HOCM (hypertrophic obstructive cardiomyopathy) (HCC) 10/17/2020   Obesity (BMI 30.0-34.9) 02/22/2019   Hypertension 07/28/2017   Prediabetes 07/29/2016   ADD (attention deficit disorder) 01/01/2016   Syncope 01/01/2016   History of alcohol abuse 01/01/2016   History of drug abuse (HCC) 01/01/2016   Bipolar disorder (HCC) 01/01/2016   Hyperlipidemia 01/01/2016   PTSD (post-traumatic stress disorder) 01/01/2016   Osteoarthritis of both feet 01/01/2016   Bilateral hip pain 01/01/2016   IC (interstitial cystitis) 07/25/2012    Conditions to be addressed/monitored per PCP order:   refrigerator   There are no care plans that you recently modified to display for this patient.   Follow up:  Patient agrees to Care Plan and Follow-up.  Plan: The Managed Medicaid care management team will reach out to the patient again over the  next 30 days.  Date/time of next scheduled Social Work care management/care coordination outreach:  01/11/23  Gus Puma, Kenard Gower, [facility]  Managed [facility] Social Worker [phone]

## 2022-12-12 NOTE — Patient Instructions (Signed)
Visit Information  Kylie Wright was given information about Medicaid Managed Care team care coordination services as a part of their Healthy [facility] Medicaid benefit. [name] verbally consented to engagement with the [facility] Managed Care team.   If you are experiencing a medical emergency, please call 911 or report to your local emergency department or urgent care.   If you have a non-emergency medical problem during routine business hours, please contact your provider's office and ask to speak with a nurse.   For questions related to your Healthy [facility] health plan, please call: [phone] or visit the homepage here: [email or web address]  If you would like to schedule transportation through your Healthy [facility] plan, please call the following number at least 2 days in advance of your appointment: [phone]  For information about your ride after you set it up, call Ride Assist at [phone]. Use this number to activate a Will Call pickup, or if your transportation is late for a scheduled pickup. Use this number, too, if you need to make a change or cancel a previously scheduled reservation.  If you need transportation services right away, call [phone]. The after-hours call center is staffed 24 hours to handle ride assistance and urgent reservation requests (including discharges) 365 days a year. Urgent trips include sick visits, hospital discharge requests and life-sustaining treatment.  Call the [facility] Line at [phone], at any time, 24 hours a day, 7 days a week. If you are in danger or need immediate medical attention call 911.  If you would like help to quit smoking, call 1-800-QUIT-NOW (858-491-2809) OR Espaol: 1-855-Djelo-Ya (4-742-595-6387) o para ms informacin haga clic aqu or Text READY to 564-332 to register via text  [name] - following are the goals we discussed in your visit today:   Goals  Addressed   None      Social Worker will follow up in 30 days.   Gus Puma, Kenard Gower, MHA [facility] Health  Managed Medicaid Social Worker [phone]   Following is a copy of your plan of care:  There are no care plans that you recently modified to display for this patient.

## 2022-12-14 ENCOUNTER — Other Ambulatory Visit: Payer: Medicaid Other

## 2022-12-14 ENCOUNTER — Telehealth: Payer: Self-pay

## 2022-12-14 NOTE — Patient Instructions (Signed)
Visit Information  Thank you for taking time to visit with me today. Please don't hesitate to contact me if I can be of assistance to you in the future [[phone]]. I hope your Dec 23, 2022 appointment with [facility] team to assess the status of healing of your sternal incision wound and to determine whether continuation of Wound Vac therapy is needed, provides you with good news & positive results!  Again, please try to find someone to drive you from your sister's home to [facility] for your Oct. 4th follow up appointment.  Warm regards,  Elnita Maxwell   Following is a copy of your care plan:   Goals Addressed             This Visit's Progress    COMPLETED: Transition of Care       Current Barriers:  Knowledge Deficits related to plan of care for management of sternum wound infection requiring a Wound Vac w/ continuous suction @ for several weeks (at least through October 2024) for proper healing.   RNCM Clinical Goal(s): Short term goals are ON TRACK Patient demonstrates understanding of disease process, treatment plan, medications, and understanding of follow-up care.   Interventions: Evaluation of current treatment plan related to  self management and patient's adherence to plan as established by provider Continues to require Home Health Services to change wound vac dressing Mon/Wed/Fri. Services provided by [facility]. Patient taught how to apply a wet-to-dry dressing to sternum wound temporarily if wound vac becomes dysfunctional and HHS unable to change out wound vac within prescribed amount of 'downtime".  Impaired Skin Integrity r/t sternum wound infection (Status: On track)  Short Term Goal Discussed plans with patient for ongoing care management follow up and provided patient with direct contact information for care management team Evaluation of current treatment plan related to sternum wound infection and patient's adherence to  plan as established by provider Assessed social determinant of health barriers -  temporarily resides with sister [name] during recovery.  Patient plans to return to her home after recovery period,  re-assess for future needs. 9/12 telephone visit update - Patient and her sister are currently planning patient's tentative return home sometime next week. 9/19 telephone visit update - Patient is planning to continue living with her sister at her sister's home for the near, foreseeable future while she receives HHS including SN for wound vac dressing changes MWF, and weekly PT for strength training and re-conditioning to prior level of functioning.9/25 telephone visit update - patient feels she has enough support and does not need further case management or social worker involvement through [facility] Transition of Care program. Patient has "graduated" out of 30d TOC program.  Monitor ability to perform self care and identify potential needs. 9/25 Patient reported that her sister, whom she has been living with, has her own upcoming surgery and will not be able to transport patient from the sister's [place] home to [facility] Cardiothoracic post surgery/sternal wound with wound vac follow up which is 1 hour and 40 min away and Patient reported that she was denied MM  insurance-provided transportation because her sister [place] Patient reported that she is planning to drive herself to the appt - encouraged patient to try to find someone to drive her/ drive with her.   Patient Goals/Self-Care Activities: Take medications as prescribed   Attend all scheduled provider appointments Call pharmacy for  medication refills 3-7 days in advance of running out of medications Perform all self care activities independently  Perform IADL's (shopping, preparing meals, housekeeping, managing finances) independently Call provider office for  new concerns or questions           Patient verbalizes understanding of instructions and care plan provided today and agrees to view in MyChart. Active MyChart status and patient understanding of how to access instructions and care plan via MyChart confirmed with patient.     The patient has been provided with contact information for the care management team and has been advised to call with any health related questions or concerns.   Please call the care guide team at [phone] if you need to cancel or reschedule your appointment.   Please call 1-800-273-TALK (toll free, 24 hour hotline) if you are experiencing a Mental Health or Behavioral Health Crisis or need someone to talk to.  Alyse Low, RN, BA, [facility], CRRN [facility] Coordinator, Transition of Care Ph # [phone]

## 2022-12-14 NOTE — Patient Outreach (Signed)
Care Management  Transitions of Care Program Managed Medicaid Transitions of Care week 4   12/14/2022 Name: Kylie Wright MRN: [id number] DOB: 12-31-1965  Subjective: Kylie Wright is a 57 y.o. year old female who is a primary care patient of Burns, Bobette Mo, MD. The Care Management team Engaged with patient Engaged with patient by telephone to assess and address transitions of care needs.   Consent to Services:  Patient was given information about Managed Medicaid Care Management services, agreed to services, and gave verbal consent to participate.   Assessment:           SDOH Interventions    Flowsheet Row Telephone from 11/23/2022 in [place] POPULATION HEALTH DEPARTMENT ED to Hosp-Admission (Discharged) from 08/02/2022 in [facility] C[facility] 1C MEDICAL TELEMETRY Telephone from 11/15/2021 in [facility] Specialty Clinics  SDOH Interventions     Food Insecurity Interventions Intervention Not Indicated Intervention Not Indicated Intervention Not Indicated  Housing Interventions Intervention Not Indicated  [Currently residing with sister during recovery period.  Plan is for her to return to her home.] Intervention Not Indicated Intervention Not Indicated  Transportation Interventions -- Intervention Not Indicated --  Utilities Interventions Intervention Not Indicated Intervention Not Indicated --  Financial Strain Interventions -- -- Other (Comment)        Goals Addressed             This Visit's Progress    COMPLETED: Transition of Care       Current Barriers:  Knowledge Deficits related to plan of care for management of sternum wound infection requiring a Wound Vac w/ continuous suction @ for several weeks (at least through October 2024) for proper healing.   RNCM Clinical Goal(s): Short term goals are ON TRACK Patient demonstrates understanding of disease process, treatment plan, medications, and understanding of follow-up  care.   Interventions: Evaluation of current treatment plan related to  self management and patient's adherence to plan as established by provider Continues to require Home Health Services to change wound vac dressing Mon/Wed/Fri. Services provided by [facility]. Patient taught how to apply a wet-to-dry dressing to sternum wound temporarily if wound vac becomes dysfunctional and HHS unable to change out wound vac within prescribed amount of 'downtime".  Impaired Skin Integrity r/t sternum wound infection (Status: On track)  Short Term Goal Discussed plans with patient for ongoing care management follow up and provided patient with direct contact information for care management team Evaluation of current treatment plan related to sternum wound infection and patient's adherence to plan as established by provider Assessed social determinant of health barriers -  temporarily resides with sister Kylie Wright during recovery.  Patient plans to return to her home after recovery period,  re-assess for future needs. 9/12 telephone visit update - Patient and her sister are currently planning patient's tentative return home sometime next week. 9/19 telephone visit update - Patient is planning to continue living with her sister at her sister's home for the near, foreseeable future while she receives HHS including SN for wound vac dressing changes MWF, and weekly PT for strength training and re-conditioning to prior level of functioning.9/25 telephone visit update - patient feels she has enough support and does not need further case management or social worker involvement through [facility] Transition of Care program. Patient has "graduated" out of 30d TOC program.  Monitor ability to perform self care and identify potential needs. 9/25 Patient reported that her sister,  whom she has been living with, has her own upcoming surgery and will not be able to transport patient from the sister's [place] home to  [facility] Cardiothoracic post surgery/sternal wound with wound vac follow up which is 1 hour and 40 min away and Patient reported that she was denied MM  insurance-provided transportation because her sister lives out [place] the [place] Patient reported that she is planning to drive herself to the appt - encouraged patient to try to find someone to drive her/ drive with her.   Patient Goals/Self-Care Activities: Take medications as prescribed   Attend all scheduled provider appointments Call pharmacy for medication refills 3-7 days in advance of running out of medications Perform all self care activities independently  Perform IADL's (shopping, preparing meals, housekeeping, managing finances) independently Call provider office for new concerns or questions           Plan: The patient has been provided with contact information for the care management team and has been advised to call with any health related questions or concerns.   Alyse Low, RN, BA, [facility], CRRN [facility] Coordinator, Transition of Care Ph # [phone]

## 2022-12-21 LAB — CUP PACEART REMOTE DEVICE CHECK
Date Time Interrogation Session: 20241002102408
Implantable Pulse Generator Implant Date: 20240621

## 2022-12-22 ENCOUNTER — Ambulatory Visit: Payer: Medicaid Other | Admitting: Internal Medicine

## 2022-12-22 ENCOUNTER — Telehealth: Payer: Self-pay

## 2022-12-22 NOTE — Telephone Encounter (Signed)
Alert received from [employer] for no electrical activity on loop recorder tracing.  Per review of Pt chart, Pt was found deceased in her home 12/28/2022.  Follow up appointments canceled.  Will forward to HIM.

## 2022-12-23 ENCOUNTER — Telehealth: Payer: Self-pay | Admitting: Internal Medicine

## 2022-12-23 NOTE — Telephone Encounter (Signed)
Called to f/u son [name] reports pt passed away on 2022-12-29.  I expressed my condolences to son.  Advised will notify MD.

## 2022-12-23 NOTE — Telephone Encounter (Signed)
Patient called to talk with Dr. Izora Ribas or nurse in regards to a death in their family

## 2023-01-11 ENCOUNTER — Ambulatory Visit: Payer: Medicaid Other

## 2023-01-20 DEATH — deceased

## 2023-02-10 ENCOUNTER — Ambulatory Visit: Payer: Medicaid Other | Admitting: Internal Medicine

## 2023-04-07 ENCOUNTER — Ambulatory Visit: Payer: Medicaid Other | Admitting: Internal Medicine
